# Patient Record
Sex: Male | Born: 1955 | Race: Black or African American | Hispanic: No | Marital: Married | Smoking: Former smoker
Health system: Southern US, Community
[De-identification: ages and names within clinical notes are randomized; demographics above are authoritative.]

## PROBLEM LIST (undated history)

## (undated) DIAGNOSIS — G4733 Obstructive sleep apnea (adult) (pediatric): Secondary | ICD-10-CM

## (undated) DIAGNOSIS — I509 Heart failure, unspecified: Secondary | ICD-10-CM

## (undated) DIAGNOSIS — G40909 Epilepsy, unspecified, not intractable, without status epilepticus: Secondary | ICD-10-CM

## (undated) DIAGNOSIS — I639 Cerebral infarction, unspecified: Secondary | ICD-10-CM

## (undated) DIAGNOSIS — E119 Type 2 diabetes mellitus without complications: Secondary | ICD-10-CM

## (undated) DIAGNOSIS — K7460 Unspecified cirrhosis of liver: Secondary | ICD-10-CM

## (undated) DIAGNOSIS — I10 Essential (primary) hypertension: Secondary | ICD-10-CM

## (undated) DIAGNOSIS — K219 Gastro-esophageal reflux disease without esophagitis: Secondary | ICD-10-CM

---

## 2009-05-01 ENCOUNTER — Ambulatory Visit: Payer: Self-pay | Admitting: Diagnostic Radiology

## 2009-05-01 ENCOUNTER — Emergency Department (HOSPITAL_BASED_OUTPATIENT_CLINIC_OR_DEPARTMENT_OTHER): Admission: EM | Admit: 2009-05-01 | Discharge: 2009-05-01 | Payer: Self-pay | Admitting: Emergency Medicine

## 2009-10-21 ENCOUNTER — Emergency Department (HOSPITAL_BASED_OUTPATIENT_CLINIC_OR_DEPARTMENT_OTHER)
Admission: EM | Admit: 2009-10-21 | Discharge: 2009-10-21 | Payer: Self-pay | Source: Home / Self Care | Admitting: Emergency Medicine

## 2009-10-22 ENCOUNTER — Emergency Department (HOSPITAL_BASED_OUTPATIENT_CLINIC_OR_DEPARTMENT_OTHER): Admission: EM | Admit: 2009-10-22 | Discharge: 2009-10-22 | Payer: Self-pay | Admitting: Emergency Medicine

## 2009-10-22 ENCOUNTER — Ambulatory Visit: Payer: Self-pay | Admitting: Diagnostic Radiology

## 2009-10-24 ENCOUNTER — Emergency Department (HOSPITAL_BASED_OUTPATIENT_CLINIC_OR_DEPARTMENT_OTHER)
Admission: EM | Admit: 2009-10-24 | Discharge: 2009-10-24 | Payer: Self-pay | Source: Home / Self Care | Admitting: Emergency Medicine

## 2010-01-05 ENCOUNTER — Emergency Department (HOSPITAL_BASED_OUTPATIENT_CLINIC_OR_DEPARTMENT_OTHER)
Admission: EM | Admit: 2010-01-05 | Discharge: 2010-01-06 | Payer: Self-pay | Source: Home / Self Care | Admitting: Emergency Medicine

## 2010-01-06 ENCOUNTER — Encounter (INDEPENDENT_AMBULATORY_CARE_PROVIDER_SITE_OTHER): Payer: Self-pay | Admitting: Emergency Medicine

## 2010-01-06 ENCOUNTER — Ambulatory Visit (HOSPITAL_COMMUNITY)
Admission: RE | Admit: 2010-01-06 | Discharge: 2010-01-06 | Payer: Self-pay | Source: Home / Self Care | Admitting: Emergency Medicine

## 2010-01-06 ENCOUNTER — Ambulatory Visit: Payer: Self-pay | Admitting: Vascular Surgery

## 2010-12-16 LAB — CBC
HCT: 34.9 % — ABNORMAL LOW (ref 39.0–52.0)
Hemoglobin: 12.2 g/dL — ABNORMAL LOW (ref 13.0–17.0)
Platelets: 78 10*3/uL — ABNORMAL LOW (ref 150–400)

## 2010-12-16 LAB — DIFFERENTIAL
Eosinophils Absolute: 0 10*3/uL (ref 0.0–0.7)
Lymphocytes Relative: 34 % (ref 12–46)
Lymphs Abs: 1.9 10*3/uL (ref 0.7–4.0)
Monocytes Relative: 10 % (ref 3–12)

## 2010-12-16 LAB — PROTIME-INR
INR: 1.1 (ref 0.00–1.49)
Prothrombin Time: 14.1 seconds (ref 11.6–15.2)

## 2010-12-16 LAB — COMPREHENSIVE METABOLIC PANEL
BUN: 22 mg/dL (ref 6–23)
Calcium: 9 mg/dL (ref 8.4–10.5)
GFR calc Af Amer: >60 mL/min (ref >60)
GFR calc non Af Amer: >60 mL/min (ref >60)
Glucose, Bld: 341 mg/dL — ABNORMAL HIGH (ref 70–99)
Sodium: 137 mEq/L (ref 135–145)
Total Bilirubin: 1.1 mg/dL (ref 0.3–1.2)

## 2010-12-19 LAB — BASIC METABOLIC PANEL
Calcium: 8.7 mg/dL (ref 8.4–10.5)
Creatinine, Ser: 0.6 mg/dL (ref 0.4–1.5)
GFR calc non Af Amer: >60 mL/min (ref >60)
Sodium: 136 mEq/L (ref 135–145)

## 2013-08-17 ENCOUNTER — Encounter (HOSPITAL_BASED_OUTPATIENT_CLINIC_OR_DEPARTMENT_OTHER): Payer: Self-pay | Admitting: Emergency Medicine

## 2013-08-17 ENCOUNTER — Emergency Department (HOSPITAL_BASED_OUTPATIENT_CLINIC_OR_DEPARTMENT_OTHER): Payer: Medicare Other

## 2013-08-17 ENCOUNTER — Inpatient Hospital Stay (HOSPITAL_BASED_OUTPATIENT_CLINIC_OR_DEPARTMENT_OTHER)
Admission: EM | Admit: 2013-08-17 | Discharge: 2013-08-25 | DRG: 623 | Disposition: A | Discharge status: Home / Self Care | Payer: Medicare Other | Attending: Orthopedic Surgery | Admitting: Orthopedic Surgery

## 2013-08-17 DIAGNOSIS — I10 Essential (primary) hypertension: Secondary | ICD-10-CM | POA: Diagnosis present

## 2013-08-17 DIAGNOSIS — T502X5A Adverse effect of carbonic-anhydrase inhibitors, benzothiadiazides and other diuretics, initial encounter: Secondary | ICD-10-CM | POA: Diagnosis not present

## 2013-08-17 DIAGNOSIS — D72819 Decreased white blood cell count, unspecified: Secondary | ICD-10-CM | POA: Diagnosis present

## 2013-08-17 DIAGNOSIS — L02413 Cutaneous abscess of right upper limb: Secondary | ICD-10-CM

## 2013-08-17 DIAGNOSIS — E1165 Type 2 diabetes mellitus with hyperglycemia: Principal | ICD-10-CM | POA: Diagnosis present

## 2013-08-17 DIAGNOSIS — D696 Thrombocytopenia, unspecified: Secondary | ICD-10-CM | POA: Diagnosis present

## 2013-08-17 DIAGNOSIS — F1010 Alcohol abuse, uncomplicated: Secondary | ICD-10-CM | POA: Diagnosis present

## 2013-08-17 DIAGNOSIS — IMO0002 Reserved for concepts with insufficient information to code with codable children: Principal | ICD-10-CM | POA: Diagnosis present

## 2013-08-17 DIAGNOSIS — E876 Hypokalemia: Secondary | ICD-10-CM | POA: Diagnosis not present

## 2013-08-17 DIAGNOSIS — K7460 Unspecified cirrhosis of liver: Secondary | ICD-10-CM | POA: Diagnosis present

## 2013-08-17 DIAGNOSIS — M60009 Infective myositis, unspecified site: Secondary | ICD-10-CM | POA: Diagnosis present

## 2013-08-17 DIAGNOSIS — A4901 Methicillin susceptible Staphylococcus aureus infection, unspecified site: Secondary | ICD-10-CM | POA: Diagnosis present

## 2013-08-17 DIAGNOSIS — F17200 Nicotine dependence, unspecified, uncomplicated: Secondary | ICD-10-CM | POA: Diagnosis present

## 2013-08-17 DIAGNOSIS — D62 Acute posthemorrhagic anemia: Secondary | ICD-10-CM | POA: Diagnosis not present

## 2013-08-17 DIAGNOSIS — Z794 Long term (current) use of insulin: Secondary | ICD-10-CM

## 2013-08-17 DIAGNOSIS — L02419 Cutaneous abscess of limb, unspecified: Secondary | ICD-10-CM

## 2013-08-17 DIAGNOSIS — E871 Hypo-osmolality and hyponatremia: Secondary | ICD-10-CM | POA: Diagnosis not present

## 2013-08-17 DIAGNOSIS — E119 Type 2 diabetes mellitus without complications: Secondary | ICD-10-CM | POA: Diagnosis present

## 2013-08-17 HISTORY — DX: Essential (primary) hypertension: I10

## 2013-08-17 HISTORY — DX: Type 2 diabetes mellitus without complications: E11.9

## 2013-08-17 LAB — BASIC METABOLIC PANEL
Chloride: 94 mEq/L — ABNORMAL LOW (ref 96–112)
Creatinine, Ser: 0.8 mg/dL (ref 0.50–1.35)
GFR calc Af Amer: >90 mL/min (ref >90)
Sodium: 134 mEq/L — ABNORMAL LOW (ref 135–145)

## 2013-08-17 MED ORDER — IOHEXOL 300 MG/ML  SOLN
100.0000 mL | Freq: Once | INTRAMUSCULAR | Status: AC | PRN
  Administered 2013-08-17: 100 mL via INTRAVENOUS

## 2013-08-17 MED ORDER — SODIUM CHLORIDE 0.9 % IV SOLN
Freq: Once | INTRAVENOUS | Status: AC
  Administered 2013-08-17: 20 mL/h via INTRAVENOUS

## 2013-08-17 MED ORDER — OXYCODONE-ACETAMINOPHEN 5-325 MG PO TABS
1.0000 | ORAL_TABLET | Freq: Once | ORAL | Status: AC
  Administered 2013-08-17: 1 via ORAL
  Filled 2013-08-17: qty 1

## 2013-08-17 MED ORDER — VANCOMYCIN HCL IN DEXTROSE 1-5 GM/200ML-% IV SOLN
1000.0000 mg | Freq: Once | INTRAVENOUS | Status: AC
  Administered 2013-08-17: 1000 mg via INTRAVENOUS
  Filled 2013-08-17: qty 200

## 2013-08-17 NOTE — ED Notes (Signed)
Right arm pain x 3 days-denies injury-swelling noted at elbow

## 2013-08-17 NOTE — ED Notes (Signed)
C/o rt fore arm swelling and painful x 5 days  Denies inj

## 2013-08-17 NOTE — ED Notes (Signed)
Return from ct.

## 2013-08-17 NOTE — ED Notes (Signed)
Patient transported to X-ray 

## 2013-08-17 NOTE — ED Provider Notes (Signed)
CSN: [id number]     Arrival date & time 08/17/13  2018 History   First MD Initiated Contact with Patient 08/17/13 2033     Chief Complaint  Patient presents with  . Arm Pain   (Consider location/radiation/quality/duration/timing/severity/associated sxs/prior Treatment) HPI Comments: A 57 year old male presents complaining of R forearm pain X 5 days. The pain has been progressive and he has developed swelling localized to the proximal forearm. He describes the pain as constant, throbbing and rates as 7/10. Movement makes it worse. He denies injury, heavy lifting. He states he had a subjective fever 3 days ago but none since. He want to [facility] hospital yesterday and was prescribed clindamycin for possible infection.  He denies n/v, HA, chills numbness or tingling in hands.  Patient is a 57 y.o. male presenting with arm pain. The history is provided by the patient. No language interpreter was used.  Arm Pain Associated symptoms include a fever and nausea. Pertinent negatives include no chest pain, chills, numbness or vomiting.    Past Medical History  Diagnosis Date  . Diabetes mellitus without complication   . Hypertension    History reviewed. No pertinent past surgical history. No family history on file. History  Substance Use Topics  . Smoking status: Current Every Day Smoker  . Smokeless tobacco: Not on file  . Alcohol Use: No    Review of Systems  Constitutional: Positive for fever. Negative for chills.       See HPI.  Respiratory: Negative for shortness of breath.   Cardiovascular: Negative for chest pain.  Gastrointestinal: Positive for nausea. Negative for vomiting.  Musculoskeletal:       See HPI.  Neurological: Negative for numbness.    Allergies  Shellfish allergy  Home Medications   Current Outpatient Rx  Name  Route  Sig  Dispense  Refill  . clindamycin (CLEOCIN) 150 MG capsule   Oral   Take 450 mg by mouth 3 (three) times daily.         . folic acid  (FOLVITE) 1 MG tablet   Oral   Take 1 mg by mouth daily.         Marland Kitchen glipiZIDE (GLUCOTROL) 10 MG tablet   Oral   Take 10 mg by mouth 2 (two) times daily before a meal.         . insulin glargine (LANTUS) 100 UNIT/ML injection   Subcutaneous   Inject 50 Units into the skin 2 (two) times daily.         Marland Kitchen lisinopril-hydrochlorothiazide (PRINZIDE,ZESTORETIC) 20-25 MG per tablet   Oral   Take 1 tablet by mouth daily.         . metFORMIN (GLUCOPHAGE-XR) 500 MG 24 hr tablet   Oral   Take 1,000 mg by mouth 2 (two) times daily.         . Multiple Vitamins-Minerals (MULTIVITAMIN WITH MINERALS) tablet   Oral   Take 1 tablet by mouth daily.         Marland Kitchen omeprazole (PRILOSEC) 20 MG capsule   Oral   Take 40 mg by mouth daily.         . propranolol (INDERAL) 40 MG tablet   Oral   Take 40 mg by mouth 2 (two) times daily.         Marland Kitchen thiamine (VITAMIN B-1) 100 MG tablet   Oral   Take 100 mg by mouth daily.          BP 205/107  Pulse 101  Temp(Src) 98.1 F (36.7 C) (Oral)  Resp 16  Ht 5\' 9"  (1.753 m)  Wt 189 lb (85.73 kg)  BMI 27.90 kg/m2  SpO2 100% Physical Exam  Constitutional: He appears well-developed and well-nourished. No distress.  Cardiovascular: Normal rate, regular rhythm, normal heart sounds and intact distal pulses.   Pulmonary/Chest: Effort normal and breath sounds normal.  Musculoskeletal:  Palpable mass right forearm proximal volar aspect. No redness or warmth. FROM. Elbow unremarkable.   Skin: No rash noted. He is not diaphoretic. No erythema. No pallor.    ED Course  Procedures (including critical care time) Labs Review Labs Reviewed - No data to display Imaging Review No results found.  EKG Interpretation   None       MDM  No diagnosis found.     Arnoldo Hooker, PA-C 08/17/13 [phone]

## 2013-08-18 ENCOUNTER — Encounter (HOSPITAL_COMMUNITY): Payer: Self-pay | Admitting: Anesthesiology

## 2013-08-18 ENCOUNTER — Encounter (HOSPITAL_COMMUNITY)
Admission: EM | Disposition: A | Discharge status: Home / Self Care | Payer: Self-pay | Source: Home / Self Care | Attending: Orthopedic Surgery

## 2013-08-18 ENCOUNTER — Emergency Department (HOSPITAL_COMMUNITY): Payer: Medicare Other | Admitting: Anesthesiology

## 2013-08-18 ENCOUNTER — Encounter (HOSPITAL_COMMUNITY): Payer: Medicare Other | Admitting: Anesthesiology

## 2013-08-18 DIAGNOSIS — E119 Type 2 diabetes mellitus without complications: Secondary | ICD-10-CM | POA: Diagnosis present

## 2013-08-18 DIAGNOSIS — L02413 Cutaneous abscess of right upper limb: Secondary | ICD-10-CM | POA: Diagnosis present

## 2013-08-18 DIAGNOSIS — E1165 Type 2 diabetes mellitus with hyperglycemia: Secondary | ICD-10-CM

## 2013-08-18 DIAGNOSIS — I10 Essential (primary) hypertension: Secondary | ICD-10-CM

## 2013-08-18 DIAGNOSIS — D696 Thrombocytopenia, unspecified: Secondary | ICD-10-CM

## 2013-08-18 DIAGNOSIS — IMO0002 Reserved for concepts with insufficient information to code with codable children: Secondary | ICD-10-CM

## 2013-08-18 HISTORY — PX: I & D EXTREMITY: SHX5045

## 2013-08-18 LAB — CBC WITH DIFFERENTIAL/PLATELET
Eosinophils Absolute: 0 10*3/uL (ref 0.0–0.7)
Eosinophils Relative: 1 % (ref 0–5)
HCT: 38.4 % — ABNORMAL LOW (ref 39.0–52.0)
Lymphocytes Relative: 51 % — ABNORMAL HIGH (ref 12–46)
Lymphs Abs: 1.6 10*3/uL (ref 0.7–4.0)
MCH: 33.3 pg (ref 26.0–34.0)
MCV: 92.5 fL (ref 78.0–100.0)
Monocytes Absolute: 0.3 10*3/uL (ref 0.1–1.0)
RBC: 4.15 MIL/uL — ABNORMAL LOW (ref 4.22–5.81)
RDW: 14.1 % (ref 11.5–15.5)
WBC: 3.1 10*3/uL — ABNORMAL LOW (ref 4.0–10.5)

## 2013-08-18 LAB — GLUCOSE, CAPILLARY
Glucose-Capillary: 147 mg/dL — ABNORMAL HIGH (ref 70–99)
Glucose-Capillary: 167 mg/dL — ABNORMAL HIGH (ref 70–99)
Glucose-Capillary: 52 mg/dL — ABNORMAL LOW (ref 70–99)

## 2013-08-18 LAB — URINALYSIS, ROUTINE W REFLEX MICROSCOPIC
Glucose, UA: NEGATIVE mg/dL
Leukocytes, UA: NEGATIVE
Nitrite: NEGATIVE
Specific Gravity, Urine: 1.02 (ref 1.005–1.030)
Urobilinogen, UA: 0.2 mg/dL (ref 0.0–1.0)

## 2013-08-18 LAB — URINE MICROSCOPIC-ADD ON

## 2013-08-18 LAB — C-REACTIVE PROTEIN: CRP: 1.4 mg/dL — ABNORMAL HIGH (ref <0.60)

## 2013-08-18 LAB — HEMOGLOBIN A1C
Hgb A1c MFr Bld: 7.1 % — ABNORMAL HIGH (ref <5.7)
Mean Plasma Glucose: 157 mg/dL — ABNORMAL HIGH (ref <117)

## 2013-08-18 LAB — SEDIMENTATION RATE: Sed Rate: 65 mm/hr — ABNORMAL HIGH (ref 0–16)

## 2013-08-18 SURGERY — IRRIGATION AND DEBRIDEMENT EXTREMITY
Anesthesia: General | Site: Arm Lower | Laterality: Right | Wound class: Dirty or Infected

## 2013-08-18 MED ORDER — LACTATED RINGERS IV SOLN
INTRAVENOUS | Status: DC | PRN
  Administered 2013-08-18: 02:00:00 via INTRAVENOUS

## 2013-08-18 MED ORDER — SUCCINYLCHOLINE CHLORIDE 20 MG/ML IJ SOLN
INTRAMUSCULAR | Status: DC | PRN
  Administered 2013-08-18: 100 mg via INTRAVENOUS

## 2013-08-18 MED ORDER — OXYCODONE HCL 5 MG PO TABS: 5.0000 mg | ORAL_TABLET | Freq: Once | ORAL | Status: DC | PRN

## 2013-08-18 MED ORDER — PROPOFOL 10 MG/ML IV BOLUS
INTRAVENOUS | Status: DC | PRN
  Administered 2013-08-18: 40 mg via INTRAVENOUS
  Administered 2013-08-18: 200 mg via INTRAVENOUS

## 2013-08-18 MED ORDER — OXYCODONE HCL 5 MG PO TABS
5.0000 mg | ORAL_TABLET | ORAL | Status: DC | PRN
  Administered 2013-08-18 – 2013-08-20 (×3): 10 mg via ORAL
  Administered 2013-08-20: 5 mg via ORAL
  Administered 2013-08-20 – 2013-08-24 (×11): 10 mg via ORAL
  Filled 2013-08-18 (×11): qty 2
  Filled 2013-08-18: qty 1
  Filled 2013-08-18 (×3): qty 2

## 2013-08-18 MED ORDER — DOCUSATE SODIUM 100 MG PO CAPS
100.0000 mg | ORAL_CAPSULE | Freq: Two times a day (BID) | ORAL | Status: DC
  Administered 2013-08-18 – 2013-08-25 (×13): 100 mg via ORAL
  Filled 2013-08-18 (×13): qty 1

## 2013-08-18 MED ORDER — ARTIFICIAL TEARS OP OINT
TOPICAL_OINTMENT | OPHTHALMIC | Status: DC | PRN
  Administered 2013-08-18: 1 via OPHTHALMIC

## 2013-08-18 MED ORDER — VITAMIN C 500 MG PO TABS
1000.0000 mg | ORAL_TABLET | Freq: Every day | ORAL | Status: DC
  Administered 2013-08-18 – 2013-08-25 (×7): 1000 mg via ORAL
  Filled 2013-08-18 (×9): qty 2

## 2013-08-18 MED ORDER — INSULIN GLARGINE 100 UNIT/ML ~~LOC~~ SOLN
45.0000 [IU] | Freq: Every day | SUBCUTANEOUS | Status: DC
  Filled 2013-08-18: qty 0.45

## 2013-08-18 MED ORDER — LIDOCAINE HCL (CARDIAC) 20 MG/ML IV SOLN
INTRAVENOUS | Status: DC | PRN
  Administered 2013-08-18: 100 mg via INTRAVENOUS

## 2013-08-18 MED ORDER — METHOCARBAMOL 500 MG PO TABS
500.0000 mg | ORAL_TABLET | Freq: Four times a day (QID) | ORAL | Status: DC | PRN
  Administered 2013-08-21 – 2013-08-24 (×5): 500 mg via ORAL
  Filled 2013-08-18 (×5): qty 1

## 2013-08-18 MED ORDER — SODIUM CHLORIDE 0.9 % IR SOLN
Status: DC | PRN
  Administered 2013-08-18: 1000 mL

## 2013-08-18 MED ORDER — VITAMIN B-1 100 MG PO TABS
100.0000 mg | ORAL_TABLET | Freq: Every day | ORAL | Status: DC
  Administered 2013-08-18 – 2013-08-25 (×7): 100 mg via ORAL
  Filled 2013-08-18 (×9): qty 1

## 2013-08-18 MED ORDER — FOLIC ACID 1 MG PO TABS
1.0000 mg | ORAL_TABLET | Freq: Every day | ORAL | Status: DC
  Administered 2013-08-18 – 2013-08-25 (×7): 1 mg via ORAL
  Filled 2013-08-18 (×9): qty 1

## 2013-08-18 MED ORDER — INSULIN ASPART 100 UNIT/ML ~~LOC~~ SOLN
0.0000 [IU] | Freq: Three times a day (TID) | SUBCUTANEOUS | Status: DC
  Administered 2013-08-18: 3 [IU] via SUBCUTANEOUS
  Administered 2013-08-18: 8 [IU] via SUBCUTANEOUS
  Administered 2013-08-19: 2 [IU] via SUBCUTANEOUS
  Administered 2013-08-19 – 2013-08-20 (×2): 3 [IU] via SUBCUTANEOUS
  Administered 2013-08-20: 8 [IU] via SUBCUTANEOUS
  Administered 2013-08-21 (×2): 3 [IU] via SUBCUTANEOUS
  Administered 2013-08-22: 2 [IU] via SUBCUTANEOUS
  Administered 2013-08-23: 5 [IU] via SUBCUTANEOUS
  Administered 2013-08-23: 3 [IU] via SUBCUTANEOUS
  Administered 2013-08-24: 5 [IU] via SUBCUTANEOUS

## 2013-08-18 MED ORDER — LISINOPRIL 20 MG PO TABS
20.0000 mg | ORAL_TABLET | Freq: Every day | ORAL | Status: DC
  Administered 2013-08-18: 20 mg via ORAL
  Filled 2013-08-18 (×3): qty 1

## 2013-08-18 MED ORDER — GLIPIZIDE 10 MG PO TABS
10.0000 mg | ORAL_TABLET | Freq: Two times a day (BID) | ORAL | Status: DC
  Administered 2013-08-18: 10 mg via ORAL
  Filled 2013-08-18: qty 1

## 2013-08-18 MED ORDER — HYDRALAZINE HCL 20 MG/ML IJ SOLN
10.0000 mg | Freq: Four times a day (QID) | INTRAMUSCULAR | Status: DC | PRN
  Administered 2013-08-18 (×2): 10 mg via INTRAVENOUS
  Filled 2013-08-18 (×2): qty 1

## 2013-08-18 MED ORDER — CEFAZOLIN SODIUM-DEXTROSE 2-3 GM-% IV SOLR
INTRAVENOUS | Status: DC | PRN
  Administered 2013-08-18: 2 g via INTRAVENOUS

## 2013-08-18 MED ORDER — HYDROMORPHONE HCL PF 1 MG/ML IJ SOLN
0.2500 mg | INTRAMUSCULAR | Status: DC | PRN
  Administered 2013-08-18 (×4): 0.5 mg via INTRAVENOUS

## 2013-08-18 MED ORDER — INSULIN ASPART 100 UNIT/ML ~~LOC~~ SOLN: 0.0000 [IU] | Freq: Three times a day (TID) | SUBCUTANEOUS | Status: DC

## 2013-08-18 MED ORDER — INSULIN GLARGINE 100 UNIT/ML ~~LOC~~ SOLN
50.0000 [IU] | Freq: Two times a day (BID) | SUBCUTANEOUS | Status: DC
  Administered 2013-08-18: 50 [IU] via SUBCUTANEOUS
  Filled 2013-08-18 (×3): qty 0.5

## 2013-08-18 MED ORDER — HYDROCHLOROTHIAZIDE 25 MG PO TABS
25.0000 mg | ORAL_TABLET | Freq: Every day | ORAL | Status: DC
  Administered 2013-08-18: 25 mg via ORAL
  Filled 2013-08-18 (×3): qty 1

## 2013-08-18 MED ORDER — LISINOPRIL-HYDROCHLOROTHIAZIDE 20-25 MG PO TABS: 1.0000 | ORAL_TABLET | Freq: Every day | ORAL | Status: DC

## 2013-08-18 MED ORDER — ALPRAZOLAM 0.5 MG PO TABS
0.5000 mg | ORAL_TABLET | Freq: Four times a day (QID) | ORAL | Status: DC | PRN
  Administered 2013-08-25: 0.5 mg via ORAL
  Filled 2013-08-18: qty 1

## 2013-08-18 MED ORDER — MORPHINE SULFATE 2 MG/ML IJ SOLN
1.0000 mg | INTRAMUSCULAR | Status: DC | PRN
  Administered 2013-08-18: 1 mg via INTRAVENOUS
  Filled 2013-08-18: qty 1

## 2013-08-18 MED ORDER — HYDROCERIN EX CREA
TOPICAL_CREAM | Freq: Two times a day (BID) | CUTANEOUS | Status: DC
  Administered 2013-08-18 – 2013-08-21 (×6): via TOPICAL
  Administered 2013-08-22: 1 via TOPICAL
  Administered 2013-08-22 – 2013-08-23 (×2): via TOPICAL
  Administered 2013-08-23 – 2013-08-24 (×2): 1 via TOPICAL
  Administered 2013-08-24: 11:00:00 via TOPICAL
  Filled 2013-08-18 (×3): qty 113

## 2013-08-18 MED ORDER — HYDROMORPHONE HCL PF 1 MG/ML IJ SOLN
INTRAMUSCULAR | Status: AC
  Filled 2013-08-18: qty 1

## 2013-08-18 MED ORDER — ONDANSETRON HCL 4 MG PO TABS: 4.0000 mg | ORAL_TABLET | Freq: Four times a day (QID) | ORAL | Status: DC | PRN

## 2013-08-18 MED ORDER — ADULT MULTIVITAMIN W/MINERALS CH
1.0000 | ORAL_TABLET | Freq: Every day | ORAL | Status: DC
  Administered 2013-08-18 – 2013-08-25 (×7): 1 via ORAL
  Filled 2013-08-18 (×9): qty 1

## 2013-08-18 MED ORDER — METHOCARBAMOL 100 MG/ML IJ SOLN: 500.0000 mg | Freq: Four times a day (QID) | INTRAVENOUS | Status: DC | PRN

## 2013-08-18 MED ORDER — PROPRANOLOL HCL 40 MG PO TABS
40.0000 mg | ORAL_TABLET | Freq: Two times a day (BID) | ORAL | Status: DC
  Administered 2013-08-18 – 2013-08-25 (×14): 40 mg via ORAL
  Filled 2013-08-18 (×19): qty 1

## 2013-08-18 MED ORDER — ONDANSETRON HCL 4 MG/2ML IJ SOLN
4.0000 mg | Freq: Four times a day (QID) | INTRAMUSCULAR | Status: DC | PRN
  Administered 2013-08-18: 4 mg via INTRAVENOUS
  Filled 2013-08-18: qty 2

## 2013-08-18 MED ORDER — VANCOMYCIN HCL IN DEXTROSE 1-5 GM/200ML-% IV SOLN
1000.0000 mg | Freq: Three times a day (TID) | INTRAVENOUS | Status: DC
  Administered 2013-08-18 – 2013-08-20 (×8): 1000 mg via INTRAVENOUS
  Filled 2013-08-18 (×11): qty 200

## 2013-08-18 MED ORDER — PROMETHAZINE HCL 25 MG RE SUPP: 12.5000 mg | Freq: Four times a day (QID) | RECTAL | Status: DC | PRN

## 2013-08-18 MED ORDER — ONDANSETRON HCL 4 MG/2ML IJ SOLN
INTRAMUSCULAR | Status: DC | PRN
  Administered 2013-08-18: 4 mg via INTRAVENOUS

## 2013-08-18 MED ORDER — PIPERACILLIN-TAZOBACTAM 3.375 G IVPB
3.3750 g | Freq: Three times a day (TID) | INTRAVENOUS | Status: DC
  Administered 2013-08-18 – 2013-08-20 (×8): 3.375 g via INTRAVENOUS
  Filled 2013-08-18 (×10): qty 50

## 2013-08-18 MED ORDER — PANTOPRAZOLE SODIUM 40 MG PO TBEC
40.0000 mg | DELAYED_RELEASE_TABLET | Freq: Every day | ORAL | Status: DC
  Administered 2013-08-18 – 2013-08-24 (×6): 40 mg via ORAL
  Filled 2013-08-18 (×5): qty 1

## 2013-08-18 MED ORDER — FENTANYL CITRATE 0.05 MG/ML IJ SOLN
INTRAMUSCULAR | Status: DC | PRN
  Administered 2013-08-18 (×8): 50 ug via INTRAVENOUS

## 2013-08-18 MED ORDER — INSULIN GLARGINE 100 UNIT/ML ~~LOC~~ SOLN
50.0000 [IU] | Freq: Every day | SUBCUTANEOUS | Status: DC
  Administered 2013-08-18: 50 [IU] via SUBCUTANEOUS
  Filled 2013-08-18: qty 0.5

## 2013-08-18 MED ORDER — METOCLOPRAMIDE HCL 5 MG/ML IJ SOLN: 10.0000 mg | Freq: Once | INTRAMUSCULAR | Status: DC | PRN

## 2013-08-18 MED ORDER — OXYCODONE HCL 5 MG/5ML PO SOLN: 5.0000 mg | Freq: Once | ORAL | Status: DC | PRN

## 2013-08-18 MED ORDER — METFORMIN HCL ER 500 MG PO TB24
1000.0000 mg | ORAL_TABLET | Freq: Two times a day (BID) | ORAL | Status: DC
  Administered 2013-08-18: 1000 mg via ORAL
  Filled 2013-08-18 (×2): qty 2

## 2013-08-18 MED ORDER — LACTATED RINGERS IV SOLN
INTRAVENOUS | Status: DC
  Administered 2013-08-18 (×2): via INTRAVENOUS

## 2013-08-18 SURGICAL SUPPLY — 45 items
BANDAGE CONFORM 2  STR LF (GAUZE/BANDAGES/DRESSINGS) IMPLANT
BANDAGE ELASTIC 4 VELCRO ST LF (GAUZE/BANDAGES/DRESSINGS) ×2 IMPLANT
BANDAGE GAUZE ELAST BULKY 4 IN (GAUZE/BANDAGES/DRESSINGS) ×2 IMPLANT
CLOTH BEACON ORANGE TIMEOUT ST (SAFETY) ×2 IMPLANT
CORDS BIPOLAR (ELECTRODE) ×2 IMPLANT
COVER SURGICAL LIGHT HANDLE (MISCELLANEOUS) ×1 IMPLANT
CUFF TOURNIQUET SINGLE 18IN (TOURNIQUET CUFF) ×2 IMPLANT
CUFF TOURNIQUET SINGLE 24IN (TOURNIQUET CUFF) IMPLANT
DRSG ADAPTIC 3X8 NADH LF (GAUZE/BANDAGES/DRESSINGS) ×2 IMPLANT
ELECT REM PT RETURN 9FT ADLT (ELECTROSURGICAL)
ELECTRODE REM PT RTRN 9FT ADLT (ELECTROSURGICAL) IMPLANT
GAUZE XEROFORM 1X8 LF (GAUZE/BANDAGES/DRESSINGS) ×2 IMPLANT
GAUZE XEROFORM 5X9 LF (GAUZE/BANDAGES/DRESSINGS) ×1 IMPLANT
GLOVE BIOGEL M STRL SZ7.5 (GLOVE) ×2 IMPLANT
GLOVE EXAM NITRILE LRG STRL (GLOVE) ×1 IMPLANT
GLOVE SS BIOGEL STRL SZ 8 (GLOVE) ×1 IMPLANT
GLOVE SUPERSENSE BIOGEL SZ 8 (GLOVE) ×2
GLOVE SURG SS PI 7.5 STRL IVOR (GLOVE) ×1 IMPLANT
GOWN PREVENTION PLUS XLARGE (GOWN DISPOSABLE) ×2 IMPLANT
GOWN STRL NON-REIN LRG LVL3 (GOWN DISPOSABLE) ×6 IMPLANT
GOWN STRL REIN XL XLG (GOWN DISPOSABLE) ×4 IMPLANT
HANDPIECE INTERPULSE COAX TIP (DISPOSABLE)
KIT BASIN OR (CUSTOM PROCEDURE TRAY) ×2 IMPLANT
KIT ROOM TURNOVER OR (KITS) ×2 IMPLANT
MANIFOLD NEPTUNE II (INSTRUMENTS) ×2 IMPLANT
NDL HYPO 25GX1X1/2 BEV (NEEDLE) IMPLANT
NEEDLE HYPO 25GX1X1/2 BEV (NEEDLE) IMPLANT
NS IRRIG 1000ML POUR BTL (IV SOLUTION) ×2 IMPLANT
PACK ORTHO EXTREMITY (CUSTOM PROCEDURE TRAY) ×2 IMPLANT
PAD ARMBOARD 7.5X6 YLW CONV (MISCELLANEOUS) ×4 IMPLANT
PAD CAST 4YDX4 CTTN HI CHSV (CAST SUPPLIES) ×1 IMPLANT
PADDING CAST COTTON 4X4 STRL (CAST SUPPLIES) ×2
SET HNDPC FAN SPRY TIP SCT (DISPOSABLE) IMPLANT
SET IRRIG Y TYPE TUR BLADDER L (SET/KITS/TRAYS/PACK) ×1 IMPLANT
SPONGE GAUZE 4X4 12PLY (GAUZE/BANDAGES/DRESSINGS) ×2 IMPLANT
SPONGE LAP 18X18 X RAY DECT (DISPOSABLE) ×2 IMPLANT
SPONGE LAP 4X18 X RAY DECT (DISPOSABLE) ×2 IMPLANT
SWAB COLLECTION DEVICE MRSA (MISCELLANEOUS) ×1 IMPLANT
SYR CONTROL 10ML LL (SYRINGE) IMPLANT
TOWEL OR 17X24 6PK STRL BLUE (TOWEL DISPOSABLE) ×2 IMPLANT
TOWEL OR 17X26 10 PK STRL BLUE (TOWEL DISPOSABLE) ×2 IMPLANT
TUBE ANAEROBIC SPECIMEN COL (MISCELLANEOUS) ×1 IMPLANT
TUBE CONNECTING 12X1/4 (SUCTIONS) ×2 IMPLANT
WATER STERILE IRR 1000ML POUR (IV SOLUTION) ×2 IMPLANT
YANKAUER SUCT BULB TIP NO VENT (SUCTIONS) ×2 IMPLANT

## 2013-08-18 NOTE — Op Note (Signed)
See dictation #[id number] Wendall Stade MD

## 2013-08-18 NOTE — ED Notes (Signed)
Dr Gramig at bedside. 

## 2013-08-18 NOTE — Op Note (Signed)
NAME:  Ernest [name], Ernest [name] NO.:  [id number]  MEDICAL RECORD NO.:  [id number]  LOCATION:  6N06C                        FACILITY:  [facility]  PHYSICIAN:  Dionne Ano. Kamrynn Melott, M.D.DATE OF BIRTH:  1955-10-06  DATE OF PROCEDURE:  08/17/2013 DATE OF DISCHARGE:                              OPERATIVE REPORT   PREOPERATIVE DIAGNOSIS:  Enlarging painful mass, right forearm.  POSTOPERATIVE DIAGNOSIS:  Abscess, right forearm with muscle necrosis.  PROCEDURES PERFORMED: 1. I and D, deep abscess, right forearm. 2. Radial nerve exploration neurolysis, right forearm. 3. Excision, necrotic brachioradialis muscle and mobile wad muscle,     right forearm.  SURGEON:  Dionne Ano. Amanda Pea, MD.  ASSISTANT:  None.  COMPLICATIONS:  None.  ANESTHESIA:  General.  TOURNIQUET TIME:  Less than 30 minutes.  INDICATIONS:  The patient is a pleasant male, who has had an enlarging painful mass in his forearm.  He does not have a standing erythema.  He was sent over from [facility] with a small area of fluid in the brachioradialis region, I have reviewed this.  Given all issues, it is difficult to ascertain the etiology as he has had no trauma, inoculation or other issue.  Given his diabetic status, pain, and progressive worsening to a certain extent, I would recommend exploration and I and D.  OPERATIVE PROCEDURE:  The patient was seen by myself and Anesthesia, taken to the operative room and underwent a smooth induction of general anesthesia.  Following this, he was laid supine, padded fully, prepped and draped in usual sterile fashion with Hibiclens scrub and paint. Following this, the patient underwent incision over the mobile wad where the area of muscle was tense.  Dissection was carried down.  Subcu was benign.  Skin was benign.  We entered the fascia and then following this, entered deeply into the brachioradialis region and noted necrotic muscle as well as a deep abscess.   I cultured the fluid, which was clearly infected fluid for aerobic and anaerobic cultures.  The patient tolerated this well.  Following this, we performed an extensive radial nerve neurolysis and decompression in the proximal forearm followed by excision of the necrotic muscle.  This was excision of brachioradialis muscle, which was necrotic.  I took care to make sure that all tissue planes were explored and that the necrotic tissue was removed.  Given all issues, I did have some significant concerns as to the etiology, but nevertheless, it is definitive as this appeared to be an infectious process with muscle necrosis.  He states he has had a similar issue occurred in his leg before and he required drainage as well.  At the present time, I would admit him for IV antibiotics, close observation, etc.  The wound was irrigated with 3 L of saline through cysto tubing, Penrose drain, and Loose closure was accomplished and bulky bandage placed.  He will be taken to the recovery room.  We will monitor him closely.  I will place him on vanc and Zosyn until cultures are final.     Dionne Ano. Amanda Pea, M.D.     [facility]  D:  08/18/2013  T:  08/18/2013  Job:  [id number] 

## 2013-08-18 NOTE — Anesthesia Procedure Notes (Signed)
Procedure Name: Intubation Date/Time: 08/18/2013 2:18 AM Performed by: Luster Landsberg Pre-anesthesia Checklist: Patient identified, Emergency Drugs available, Suction available and Patient being monitored Patient Re-evaluated:Patient Re-evaluated prior to inductionOxygen Delivery Method: Circle system utilized Preoxygenation: Pre-oxygenation with 100% oxygen Intubation Type: IV induction, Rapid sequence and Cricoid Pressure applied Laryngoscope Size: Mac and 3 Grade View: Grade I Tube type: Oral Tube size: 7.5 mm Number of attempts: 1 Airway Equipment and Method: Stylet Placement Confirmation: ETT inserted through vocal cords under direct vision,  positive ETCO2 and breath sounds checked- equal and bilateral Secured at: 23 cm Tube secured with: Tape Dental Injury: Teeth and Oropharynx as per pre-operative assessment

## 2013-08-18 NOTE — Progress Notes (Signed)
UR completed.  Patient changed to inpatient status r/t requiring IV antibiotics and IVF

## 2013-08-18 NOTE — H&P (Signed)
Edrian Melucci is an 57 y.o. male.   Chief Complaint: Pain and swelling right forearm HPI: Patient presents with pain and swelling in his right forearm. He states that last Thursday he began having difficulties. This prompted a visit to [facility] hospital [facility] He was seen at [facility] and referred to [facility] for further care. He has a CT scan showing a fluid collection in his proximal forearm in the region of his brachioradialis muscle. He denies high fever. His wife Lennox Grumbles states that he has had some redness in your thing about the arm but is not overly impressive at the moment. He does complain of pain in the forearm.  He notes no history of trauma an puncture or other issue. He denies history of tumor. He is diabetic.  He denies neck back chest or abdominal pain. He denies any instability type symptoms.  Past Medical History  Diagnosis Date  . Diabetes mellitus without complication   . Hypertension     History reviewed. No pertinent past surgical history.  No family history on file. Social History:  reports that he has been smoking.  He does not have any smokeless tobacco history on file. He reports that he does not drink alcohol. His drug history is not on file.  Allergies:  Allergies  Allergen Reactions  . Shellfish Allergy Swelling     (Not in a hospital admission)  Results for orders placed during the hospital encounter of 08/17/13 (from the past 48 hour(s))  BASIC METABOLIC PANEL     Status: Abnormal   Collection Time    08/17/13  9:59 PM      Result Value Range   Sodium 134 (*) 135 - 145 mEq/L   Potassium 4.0  3.5 - 5.1 mEq/L   Chloride 94 (*) 96 - 112 mEq/L   CO2 27  19 - 32 mEq/L   Glucose, Bld 195 (*) 70 - 99 mg/dL   BUN 8  6 - 23 mg/dL   Creatinine, Ser 1.61  0.50 - 1.35 mg/dL   Calcium 8.5  8.4 - 09.6 mg/dL   GFR calc non Af Amer >90  >90 mL/min   GFR calc Af Amer >90  >90 mL/min   Comment:  (NOTE)     The eGFR has been calculated using the CKD EPI equation.     This calculation has not been validated in all clinical situations.     eGFR's persistently <90 mL/min signify possible Chronic Kidney     Disease.  CBC WITH DIFFERENTIAL     Status: Abnormal (Preliminary result)   Collection Time    08/18/13  1:25 AM      Result Value Range   WBC 3.1 (*) 4.0 - 10.5 K/uL   RBC 4.15 (*) 4.22 - 5.81 MIL/uL   Hemoglobin 13.8  13.0 - 17.0 g/dL   HCT 04.5 (*) 40.9 - 81.1 %   MCV 92.5  78.0 - 100.0 fL   MCH 33.3  26.0 - 34.0 pg   MCHC 35.9  30.0 - 36.0 g/dL   RDW 91.4  78.2 - 95.6 %   Platelets PENDING  150 - 400 K/uL   Neutrophils Relative % 40 (*) 43 - 77 %   Neutro Abs 1.3 (*) 1.7 - 7.7 K/uL   Lymphocytes Relative 51 (*) 12 - 46 %   Lymphs Abs 1.6  0.7 - 4.0 K/uL  Monocytes Relative 8  3 - 12 %   Monocytes Absolute 0.3  0.1 - 1.0 K/uL   Eosinophils Relative 1  0 - 5 %   Eosinophils Absolute 0.0  0.0 - 0.7 K/uL   Basophils Relative 0  0 - 1 %   Basophils Absolute 0.0  0.0 - 0.1 K/uL   Ct Forearm Right W Contrast  08/17/2013   CLINICAL DATA:  Forearm swelling for 3 days, no injury.  EXAM: CT OF THE RIGHT FOREARM WITH CONTRAST  TECHNIQUE: Multidetector CT imaging was performed following the standard protocol during bolus administration of intravenous contrast.  CONTRAST:  OMNIPAQUE IOHEXOL 300 MG/ML  SOLN  COMPARISON:  None available for comparison at time of study interpretation.  FINDINGS: 13 x 9 x 20 mm (transverse by AP by CC) rim enhancing fluid collection within the brachioradialis muscle, approximately 4 cm distal to the antecubital fossa consistent with abscess. The muscle appears mildly hypodense most consistent with edema. Mild reticulation of the associated overlying subcutaneous fat suggests cellulitis. No additional fluid collection seen. Tiny filling defect within an anterior forearm vein, axial 8/133 may reflect a tiny thrombus.  No fracture dislocation.  No  destructive bony lesions.  IMPRESSION: 13 x 9 x 20 mm fluid collection within the brachioradialis muscle concerning for abscess with muscle edema. Tiny filling defect within an anterior forearm vein concerning for thrombus. Minimal inflammatory changes of the subcutaneous fat could reflect cellulitis.   Electronically Signed   By: Awilda Metro   On: 08/17/2013 23:14    Review of Systems  Constitutional: Negative.   Eyes: Negative.   Cardiovascular: Negative.   Gastrointestinal: Negative.   Genitourinary: Negative.   Skin: Negative.   Neurological: Negative.   Endo/Heme/Allergies: Negative.   Psychiatric/Behavioral: Negative.     Blood pressure 144/74, pulse 95, temperature 97.6 F (36.4 C), temperature source Oral, resp. rate 17, height 5\' 9"  (1.753 m), weight 85.73 kg (189 lb), SpO2 99.00%. Physical Exam this patient has a area of pain in the proximal forearm his forearm is tender to palpation. He has a hard and yielding region about the mobile wad in his forearm. I this is the area of maximal tenderness. He is neurovascularly intact he has no signs of compartment syndrome or dystrophy. I discussed these issues with him at length.  He has normal pulse he is sensate in his shoulder and wrist are nontender. His CT scan is reviewed which does show evidence of a fluid collection in the proximal forearm  .Marland KitchenThe patient is alert and oriented in no acute distress the patient complains of pain in the affected upper extremity.  The patient is noted to have a normal HEENT exam.  Lung fields show equal chest expansion and no shortness of breath  abdomen exam is nontender without distention.  Lower extremity examination does not show any fracture dislocation or blood clot symptoms.  Pelvis is stable neck and back are stable and nontender  Assessment/Plan Patient has a continuing pain in his proximal forearm with swelling and a fluid fluctuance on CT exam tonight. At this juncture given the  failure of conservative measures and continued pain would recommend considering irrigation debridement and thorough exploration. His had a similar problem in his leg which prompted the same course of action however this was performed at [facility]. The patient understands risk and benefits and desires to proceed with an irrigation and debridement-type procedure tonight. I feel that given his exam pain and worsening problems  this is certainly the best route for this patient who is a diabetic and more prone to infections in the general population.  Marland Kitchen.We are planning surgery for your upper extremity. The risk and benefits of surgery include risk of bleeding infection anesthesia damage to normal structures and failure of the surgery to accomplish its intended goals of relieving symptoms and restoring function with this in mind we'll going to proceed. I have specifically discussed with the patient the pre-and postoperative regime and the does and don'ts and risk and benefits in great detail. Risk and benefits of surgery also include risk of dystrophy chronic nerve pain failure of the healing process to go onto completion and other inherent risks of surgery The relavent the pathophysiology of the disease/injury process, as well as the alternatives for treatment and postoperative course of action has been discussed in great detail with the patient who desires to proceed.  We will do everything in our power to help you (the patient) restore function to the upper extremity. Is a pleasure to see this patient today.   Braeleigh Pyper III,Xzaria Teo M 08/18/2013, 1:47 AM

## 2013-08-18 NOTE — Anesthesia Preprocedure Evaluation (Addendum)
Anesthesia Evaluation  Patient identified by MRN, date of birth, ID band Patient awake    Reviewed: Allergy & Precautions, H&P , NPO status , Patient's Chart, lab work & pertinent test results, reviewed documented beta blocker date and time   Airway Mallampati: II TM Distance: >3 FB Neck ROM: full    Dental   Pulmonary Current Smoker,  breath sounds clear to auscultation        Cardiovascular hypertension, On Medications and Pt. on home beta blockers Rhythm:regular     Neuro/Psych negative neurological ROS  negative psych ROS   GI/Hepatic negative GI ROS, Neg liver ROS,   Endo/Other  diabetes, Oral Hypoglycemic Agents  Renal/GU negative Renal ROS  negative genitourinary   Musculoskeletal   Abdominal   Peds  Hematology negative hematology ROS (+)   Anesthesia Other Findings See surgeon's H&P   Reproductive/Obstetrics negative OB ROS                          Anesthesia Physical Anesthesia Plan  ASA: III and emergent  Anesthesia Plan: General   Post-op Pain Management:    Induction: Intravenous, Rapid sequence and Cricoid pressure planned  Airway Management Planned: Oral ETT  Additional Equipment:   Intra-op Plan:   Post-operative Plan: Extubation in OR  Informed Consent: I have reviewed the patients History and Physical, chart, labs and discussed the procedure including the risks, benefits and alternatives for the proposed anesthesia with the patient or authorized representative who has indicated his/her understanding and acceptance.   Dental Advisory Given  Plan Discussed with: CRNA and Surgeon  Anesthesia Plan Comments:         Anesthesia Quick Evaluation

## 2013-08-18 NOTE — Transfer of Care (Signed)
Immediate Anesthesia Transfer of Care Note  Patient: Ernest [name]  Procedure(s) Performed: Procedure(s): IRRIGATION AND DEBRIDEMENT EXTREMITY (Right)  Patient Location: PACU  Anesthesia Type:General  Level of Consciousness: awake, alert  and oriented  Airway & Oxygen Therapy: Patient Spontanous Breathing and Patient connected to nasal cannula oxygen  Post-op Assessment: Report given to PACU RN, Post -op Vital signs reviewed and stable and Patient moving all extremities  Post vital signs: Reviewed and stable  Complications: No apparent anesthesia complications

## 2013-08-18 NOTE — ED Notes (Addendum)
Per verbal order from Dr. Fredderick Phenix  Stop vanc and make iv nsl and leave in

## 2013-08-18 NOTE — Progress Notes (Signed)
Patient ID: Ernest [name], male   DOB: [birth year], 57 y.o.   MRN: [id number] Status post I&D right forearm abscess with muscle necrosis. Patient was I&D in the early hours of this date.  Vital signs stable.  He is tolerating a diet.  He notes no locking popping or catching. He has good finger flexion and extension and he is sensate. I feel he likely get some wrist extension weakness due to the muscle loss but nevertheless the fingers are firing well.  I discussed with patient we are now point to await his cultures old skin continue elevation motion and massage to the fingers and see how he does of the course of the next few days. Hopefully we can maintain tight diabetic control and see him back closely.  He understands once final cultures are obtained we'll transition to a by mouth antibiotic and move forward. I have discussed these issues with him at length. I will plan to remove his Penrose drain tomorrow and perform a formal dressing change and I&D at bedside.  Lower extremity examination is benign chest and lungs are benign abdomen is nontender obese. HEENT stay normal limits.  We'll check him in March mentioned he understands all issues plans and cautions  W Zhana Jeangilles M.D.

## 2013-08-18 NOTE — Progress Notes (Signed)
Inpatient Diabetes Program Recommendations  AACE/ADA: New Consensus Statement on Inpatient Glycemic Control (2013)  Target Ranges:  Prepandial:   less than 140 mg/dL      Peak postprandial:   less than 180 mg/dL (1-2 hours)      Critically ill patients:  140 - 180 mg/dL    Results for Ernest [name], Ernest [name] (MRN [id number]) as of 08/18/2013 13:10  Ref. Range 08/18/2013 07:51 08/18/2013 12:09  Glucose-Capillary Latest Range: 70-99 mg/dL 846 (H) 962 (H)    Inpatient Diabetes Program Recommendations Correction (SSI): MD- Please add Novolog Moderate correction scale (SSI) tid ac + HS.   Will follow. Ambrose Finland RN, MSN, CDE Diabetes Coordinator Inpatient Diabetes Program Team Pager: [phone] (8a-10p)

## 2013-08-18 NOTE — ED Notes (Signed)
Surgeon at bedside.  

## 2013-08-18 NOTE — ED Provider Notes (Signed)
Pt with arm pain for last 4 days, +mild swelling/TTP over proximal forearm, radial side.  No bony tenderness.  Feels firm on palpation, but no fluctuance/induration.  Mild erythema to area.  +deeper abscess to brachioradialis muscle on CT 1.3x2cm.  Given IV vanc, TDAP utd.  Discussed with Dr. Amanda Pea who will see pt in the ED at [facility].  Pt's wife to transport.  Rolan Bucco, MD 08/18/13 [phone]

## 2013-08-18 NOTE — ED Notes (Signed)
Iv wrapped w kling,  Pt advised to go directly to [facility],  Do not eat or drink while in route  Pt a&o  ambulatory

## 2013-08-18 NOTE — ED Provider Notes (Signed)
Patient transferred from [facility] by Dr. Fredderick Phenix and Ms. Upstill. Pt has a right forearm fluid collection, possible abscess. Warm to touch, +edema. S/p vanc. Dr. Amanda Pea here to further evaluate the patient.  Filed Vitals:   08/18/13 0046  BP: 179/98  Pulse: 97  Temp: 97.6 F (36.4 C)  Resp: 20     Derwood Kaplan, MD 08/18/13 0109

## 2013-08-18 NOTE — ED Provider Notes (Signed)
Medical screening examination/treatment/procedure(s) were conducted as a shared visit with non-physician practitioner(s) and myself.  I personally evaluated the patient during the encounter.  EKG Interpretation    Date/Time:  Wednesday August 18 2013 01:22:07 EST Ventricular Rate:  95 PR Interval:  156 QRS Duration: 85 QT Interval:  376 QTC Calculation: 473 R Axis:   21 Text Interpretation:  Sinus rhythm Probable left atrial enlargement Left ventricular hypertrophy Nonspecific T abnormalities, inferior leads Baseline wander in lead(s) I Confirmed by Rhunette Croft, MD, ANKIT ([id number]) on 08/18/2013 1:29:14 AM              Rolan Bucco, MD 08/18/13 1010

## 2013-08-18 NOTE — Progress Notes (Signed)
ANTIBIOTIC CONSULT NOTE - INITIAL  Pharmacy Consult for vancomycin and zosyn  Indication: post op I+D of forearm  Allergies  Allergen Reactions  . Shellfish Allergy Swelling    Patient Measurements: Height: 5\' 9"  (175.3 cm) Weight: 196 lb 6.4 oz (89.086 kg) IBW/kg (Calculated) : 70.7 Adjusted Body Weight:   Vital Signs: Temp: 97.8 F (36.6 C) (11/19 0441) Temp src: Oral (11/19 0441) BP: 163/96 mmHg (11/19 0523) Pulse Rate: 85 (11/19 0523) Intake/Output from previous day: 11/18 0701 - 11/19 0700 In: 600 [I.V.:600] Out: -  Intake/Output from this shift: Total I/O In: 600 [I.V.:600] Out: -   Labs:  Recent Labs  08/17/13 2159 08/18/13 0125  WBC  --  3.1*  HGB  --  13.8  PLT  --  57*  CREATININE 0.80  --    Estimated Creatinine Clearance: 113.9 ml/min (by C-G formula based on Cr of 0.8). No results found for this basename: VANCOTROUGH, VANCOPEAK, VANCORANDOM, GENTTROUGH, GENTPEAK, GENTRANDOM, TOBRATROUGH, TOBRAPEAK, TOBRARND, AMIKACINPEAK, AMIKACINTROU, AMIKACIN,  in the last 72 hours   Microbiology: No results found for this or any previous visit (from the past 720 hour(s)).  Medical History: Past Medical History  Diagnosis Date  . Diabetes mellitus without complication   . Hypertension     Medications:  Prescriptions prior to admission  Medication Sig Dispense Refill  . clindamycin (CLEOCIN) 150 MG capsule Take 450 mg by mouth 3 (three) times daily.      . folic acid (FOLVITE) 1 MG tablet Take 1 mg by mouth daily.      Marland Kitchen glipiZIDE (GLUCOTROL) 10 MG tablet Take 10 mg by mouth 2 (two) times daily before a meal.      . insulin glargine (LANTUS) 100 UNIT/ML injection Inject 50 Units into the skin 2 (two) times daily.      Marland Kitchen lisinopril-hydrochlorothiazide (PRINZIDE,ZESTORETIC) 20-25 MG per tablet Take 1 tablet by mouth daily.      . metFORMIN (GLUCOPHAGE-XR) 500 MG 24 hr tablet Take 1,000 mg by mouth 2 (two) times daily.      . Multiple Vitamins-Minerals  (MULTIVITAMIN WITH MINERALS) tablet Take 1 tablet by mouth daily.      Marland Kitchen omeprazole (PRILOSEC) 20 MG capsule Take 40 mg by mouth daily.      . propranolol (INDERAL) 40 MG tablet Take 40 mg by mouth 2 (two) times daily.      Marland Kitchen thiamine (VITAMIN B-1) 100 MG tablet Take 100 mg by mouth daily.       Assessment: S/p I+D of infected proximal forearm.   Goal of Therapy:  Vancomycin trough level 10-15 mcg/ml  Plan:  Vancomycin 1gm q8h -trough at 4th dose.  Zosyn 3.375gm q8h   Janice Coffin 08/18/2013,6:38 AM

## 2013-08-18 NOTE — Consult Note (Signed)
[facility] Medical Consultation  PATIENT DETAILS Name: Ernest [name] Age: 57 y.o. Sex: male Date of Birth: 1956/04/29 Admit Date: 08/17/2013 PCP:No PCP Per Patient Requesting MD: Dominica Severin, MD PCP is Marcy [place], [place]  Date of consultation: 08/18/2013  REASON FOR CONSULTATION:  General medical management. (BP, DM, Low blood counts)  Impression/Recommendations Principal Problem:   Abscess of right arm Active Problems:   HTN (hypertension)   Type II or unspecified type diabetes mellitus with unspecified complication, uncontrolled   Thrombocytopenia, unspecified   DM uncontrolled Likely elevated due to infection Check Hgb A1c Hold Oral meds in hospital  Continue lantus bid Add SSI - Moderate  BP HCTZ, Lisinopril, Inderal Added hydralazine PRN  Thrombocytopenia Uncertain etiology (patient with some alcohol use from his description)-had thrombocytopenia in 2011 as well Checking smear, LFT, PT in am LFTs elevated in 2011 Patient reports he may have been told in the past that he has low platelets (uncertain) Avoid heparin  Leukopenia -?secondary to infection-follow CBC -check HIV  Right forearm abscess with muscle necrosis Per Primary Team Under went I and D 11/19 am. (Patient does not remember procedure)  DVT Prophylaxis:  SCD  Code Status: Full  [facility] will followup again tomorrow. Please contact me if I can be of assistance in the meanwhile. Thank you for this consultation.  Conley Canal [facility] Pager [phone]  Subjective:  Patient is a poor historian.  Depends on his wife to give history (she is not present).  He reports he was diagnosed with DM approximately 4 years ago. He reports that he may have been told in the past his platelets were low.  No complaints.   ALLERGIES:   Allergies  Allergen Reactions  . Shellfish Allergy Anaphylaxis and Swelling    PAST MEDICAL HISTORY: Past Medical  History  Diagnosis Date  . Diabetes mellitus without complication   . Hypertension     PAST SURGICAL HISTORY: History reviewed. No pertinent past surgical history.  MEDICATIONS AT HOME: Prior to Admission medications   Medication Sig Start Date End Date Taking? Authorizing Provider  clindamycin (CLEOCIN) 150 MG capsule Take 450 mg by mouth 3 (three) times daily.   Yes Historical Provider, MD  folic acid (FOLVITE) 1 MG tablet Take 1 mg by mouth daily.   Yes Historical Provider, MD  glipiZIDE (GLUCOTROL) 10 MG tablet Take 10 mg by mouth 2 (two) times daily before a meal.   Yes Historical Provider, MD  insulin glargine (LANTUS) 100 UNIT/ML injection Inject 50 Units into the skin 2 (two) times daily.   Yes Historical Provider, MD  lisinopril-hydrochlorothiazide (PRINZIDE,ZESTORETIC) 20-25 MG per tablet Take 1 tablet by mouth daily.   Yes Historical Provider, MD  metFORMIN (GLUCOPHAGE-XR) 500 MG 24 hr tablet Take 1,000 mg by mouth 2 (two) times daily.   Yes Historical Provider, MD  Multiple Vitamins-Minerals (MULTIVITAMIN WITH MINERALS) tablet Take 1 tablet by mouth daily.   Yes Historical Provider, MD  omeprazole (PRILOSEC) 20 MG capsule Take 40 mg by mouth daily.   Yes Historical Provider, MD  propranolol (INDERAL) 40 MG tablet Take 40 mg by mouth 2 (two) times daily.   Yes Historical Provider, MD  thiamine (VITAMIN B-1) 100 MG tablet Take 100 mg by mouth daily.   Yes Historical Provider, MD    FAMILY HISTORY: No family history on file. Parents are alive and living in [place].  Father has HTN.  No other known medical hx.  SOCIAL HISTORY:  reports that he has  been smoking.  He does not have any smokeless tobacco history on file. He reports that he does not drink alcohol. His drug history is not on file. Patient reports occasional tobacco use.  He drinks beer 3x a week and drinks occasional hard alcohol.  REVIEW OF SYSTEMS:  Constitutional:   No  weight loss, night sweats,  Fevers, chills,  fatigue.  HEENT:    No headaches, Difficulty swallowing,Tooth/dental problems,Sore throat,  No sneezing, itching, ear ache, nasal congestion, post nasal drip,   Cardio-vascular: No chest pain,  Orthopnea, PND, swelling in lower extremities, anasarca, dizziness, palpitations  GI:  No heartburn, indigestion, abdominal pain, nausea, vomiting, diarrhea, change in bowel habits, loss of appetite  Resp: No shortness of breath with exertion or at rest.  No excess mucus, no productive cough, No non-productive cough,  No coughing up of blood.No change in color of mucus.No wheezing.No chest wall deformity  Skin:  no rash or lesions.  GU:  no dysuria, change in color of urine, no urgency or frequency.  No flank pain.  Psych: No change in mood or affect. No depression or anxiety.  No memory loss.   PHYSICAL EXAM: Blood pressure 169/104, pulse 89, temperature 98.8 F (37.1 C), temperature source Oral, resp. rate 18, height 5\' 9"  (1.753 m), weight 89.086 kg (196 lb 6.4 oz), SpO2 98.00%.  General appearance :Awake, alert, not in any distress. Speech Clear. Not toxic Looking HEENT: Atraumatic and Normocephalic, pupils equally reactive to light and accomodation Neck: supple, no JVD. No cervical lymphadenopathy.  Chest:Good air entry bilaterally, no added sounds  CVS: S1 S2 regular, no murmurs.  Abdomen: Bowel sounds present, Non tender and not distended with no gaurding, rigidity or rebound. Extremities: B/L Lower Ext shows no edema, both legs are warm to touch, with  dorsalis pedis pulses palpable. Right upper arm in sling, wrapped in ace. Neurology: Awake alert, and oriented X 3, CN II-XII intact, Non focal, Deep Tendon Reflex-2+ all over, plantar's downgoing B/L, sensory exam is grossly intact.  Skin:No Rash, dry skin. Wounds:N/A  LABS ON ADMISSION:   Recent Labs  08/17/13 2159  NA 134*  K 4.0  CL 94*  CO2 27  GLUCOSE 195*  BUN 8  CREATININE 0.80  CALCIUM 8.5    Recent Labs   08/18/13 0125  WBC 3.1*  NEUTROABS 1.3*  HGB 13.8  HCT 38.4*  MCV 92.5  PLT 57*     RADIOLOGIC STUDIES ON ADMISSION: Ct Forearm Right W Contrast  08/17/2013   CLINICAL DATA:  Forearm swelling for 3 days, no injury.  EXAM: CT OF THE RIGHT FOREARM WITH CONTRAST  TECHNIQUE: Multidetector CT imaging was performed following the standard protocol during bolus administration of intravenous contrast.  CONTRAST:  OMNIPAQUE IOHEXOL 300 MG/ML  SOLN  COMPARISON:  None available for comparison at time of study interpretation.  FINDINGS: 13 x 9 x 20 mm (transverse by AP by CC) rim enhancing fluid collection within the brachioradialis muscle, approximately 4 cm distal to the antecubital fossa consistent with abscess. The muscle appears mildly hypodense most consistent with edema. Mild reticulation of the associated overlying subcutaneous fat suggests cellulitis. No additional fluid collection seen. Tiny filling defect within an anterior forearm vein, axial 8/133 may reflect a tiny thrombus.  No fracture dislocation.  No destructive bony lesions.  IMPRESSION: 13 x 9 x 20 mm fluid collection within the brachioradialis muscle concerning for abscess with muscle edema. Tiny filling defect within an anterior forearm vein concerning for thrombus.  Minimal inflammatory changes of the subcutaneous fat could reflect cellulitis.   Electronically Signed   By: Awilda Metro   On: 08/17/2013 23:14     Total time spent 45 minutes.  Conley Canal [facility] Pager [phone]  If 7PM-7AM, please contact night-coverage [email or web address] Password [facility] 08/18/2013, 2:18 PM  Attending Patient seen and examined, agree with the above assessment and plan. Will follow while in hospital. Suspect ETOH use-need to watch for withdrawal.May need to abdominal ultrasound to check if liver morphology consistent with Cirrhosis.

## 2013-08-19 ENCOUNTER — Inpatient Hospital Stay (HOSPITAL_COMMUNITY): Payer: Medicare Other

## 2013-08-19 LAB — HEPATIC FUNCTION PANEL
Bilirubin, Direct: 0.8 mg/dL — ABNORMAL HIGH (ref 0.0–0.3)
Total Bilirubin: 2 mg/dL — ABNORMAL HIGH (ref 0.3–1.2)
Total Protein: 7.2 g/dL (ref 6.0–8.3)

## 2013-08-19 LAB — BASIC METABOLIC PANEL
BUN: 7 mg/dL (ref 6–23)
Calcium: 8.1 mg/dL — ABNORMAL LOW (ref 8.4–10.5)
GFR calc non Af Amer: >90 mL/min (ref >90)
Glucose, Bld: 56 mg/dL — ABNORMAL LOW (ref 70–99)
Potassium: 3.1 mEq/L — ABNORMAL LOW (ref 3.5–5.1)

## 2013-08-19 LAB — DIFFERENTIAL
Basophils Absolute: 0 10*3/uL (ref 0.0–0.1)
Eosinophils Absolute: 0 10*3/uL (ref 0.0–0.7)
Eosinophils Relative: 0 % (ref 0–5)
Neutro Abs: 2.7 10*3/uL (ref 1.7–7.7)

## 2013-08-19 LAB — HEMOGLOBIN A1C
Hgb A1c MFr Bld: 7.1 % — ABNORMAL HIGH (ref <5.7)
Mean Plasma Glucose: 157 mg/dL — ABNORMAL HIGH (ref <117)

## 2013-08-19 LAB — GLUCOSE, CAPILLARY
Glucose-Capillary: 53 mg/dL — ABNORMAL LOW (ref 70–99)
Glucose-Capillary: 147 mg/dL — ABNORMAL HIGH (ref 70–99)
Glucose-Capillary: 134 mg/dL — ABNORMAL HIGH (ref 70–99)
Glucose-Capillary: 153 mg/dL — ABNORMAL HIGH (ref 70–99)
Glucose-Capillary: 107 mg/dL — ABNORMAL HIGH (ref 70–99)
Glucose-Capillary: 201 mg/dL — ABNORMAL HIGH (ref 70–99)

## 2013-08-19 LAB — CBC
HCT: 33.2 % — ABNORMAL LOW (ref 39.0–52.0)
Hemoglobin: 12.2 g/dL — ABNORMAL LOW (ref 13.0–17.0)
MCH: 34.5 pg — ABNORMAL HIGH (ref 26.0–34.0)
MCHC: 36.7 g/dL — ABNORMAL HIGH (ref 30.0–36.0)
Platelets: 78 10*3/uL — ABNORMAL LOW (ref 150–400)
RDW: 13.8 % (ref 11.5–15.5)

## 2013-08-19 LAB — PROTIME-INR
INR: 0.99 (ref 0.00–1.49)
Prothrombin Time: 12.9 seconds (ref 11.6–15.2)

## 2013-08-19 MED ORDER — INSULIN GLARGINE 100 UNIT/ML ~~LOC~~ SOLN
45.0000 [IU] | Freq: Every day | SUBCUTANEOUS | Status: DC
  Filled 2013-08-19: qty 0.45

## 2013-08-19 MED ORDER — AMLODIPINE BESYLATE 2.5 MG PO TABS
2.5000 mg | ORAL_TABLET | Freq: Every day | ORAL | Status: DC
  Administered 2013-08-19: 2.5 mg via ORAL
  Filled 2013-08-19 (×2): qty 1

## 2013-08-19 MED ORDER — POTASSIUM CHLORIDE CRYS ER 20 MEQ PO TBCR
20.0000 meq | EXTENDED_RELEASE_TABLET | Freq: Two times a day (BID) | ORAL | Status: DC
  Administered 2013-08-19 – 2013-08-25 (×11): 20 meq via ORAL
  Filled 2013-08-19 (×17): qty 1

## 2013-08-19 MED ORDER — LISINOPRIL 40 MG PO TABS
40.0000 mg | ORAL_TABLET | Freq: Every day | ORAL | Status: DC
  Administered 2013-08-19 – 2013-08-25 (×7): 40 mg via ORAL
  Filled 2013-08-19 (×7): qty 1

## 2013-08-19 MED ORDER — INSULIN GLARGINE 100 UNIT/ML ~~LOC~~ SOLN
20.0000 [IU] | Freq: Two times a day (BID) | SUBCUTANEOUS | Status: DC
  Administered 2013-08-19 – 2013-08-25 (×11): 20 [IU] via SUBCUTANEOUS
  Filled 2013-08-19 (×14): qty 0.2

## 2013-08-19 MED ORDER — INSULIN GLARGINE 100 UNIT/ML ~~LOC~~ SOLN
40.0000 [IU] | Freq: Every day | SUBCUTANEOUS | Status: DC
  Filled 2013-08-19: qty 0.4

## 2013-08-19 MED ORDER — POTASSIUM CHLORIDE IN NACL 20-0.9 MEQ/L-% IV SOLN
INTRAVENOUS | Status: DC
  Administered 2013-08-19 – 2013-08-24 (×9): via INTRAVENOUS
  Filled 2013-08-19 (×13): qty 1000

## 2013-08-19 MED ORDER — PNEUMOCOCCAL VAC POLYVALENT 25 MCG/0.5ML IJ INJ
0.5000 mL | INJECTION | INTRAMUSCULAR | Status: AC
  Administered 2013-08-20: 0.5 mL via INTRAMUSCULAR
  Filled 2013-08-19: qty 0.5

## 2013-08-19 NOTE — Consult Note (Signed)
[facility] PROGRESS NOTE  Ernest [name] ZOX:[id number] DOB: 01/24/1956 DOA: 08/17/2013 PCP: No PCP Per Patient  Assessment/Plan: 57 y/o male with PMH of IDDM, HTN, admitted with arm abscess  1. Abscess of right arm; per orthopedics   2. DM uncontrolled; HA1C-7.1; likely elevated due to infection; episodes of hypoglycemia  -Hold Oral meds in hospital  -decrease lantus to 20 units BID; +ISS  3. HTN uncontrolled; on HCTZ, Lisinopril, Inderal  -increase lisinopril to 40; add amlodipine; hold hctz due to hypo Na, hypo K;   4. Thrombocytopenia improving; LFTs elevated in 2011; elevated bili; ? etoh related;  Patient reports he may have been told in the past that he has low platelets (uncertain)  -obtain liver US;   5. Hypo Na, hypo K likely due to diuretics; hold diuretics; replace lytes;    Code Status: full Family Communication: none at the bedside (indicate person spoken with, relationship, and if by phone, the number) Disposition Plan: per primary, when ready    Consultants:    Procedures:  I&D   Antibiotics:  Zosyn per primary  (indicate start date, and stop date if known)  HPI/Subjective: alert  Objective: Filed Vitals:   08/19/13 0445  BP: 163/90  Pulse: 87  Temp: 99.5 F (37.5 C)  Resp: 20    Intake/Output Summary (Last 24 hours) at 08/19/13 0953 Last data filed at 08/19/13 0900  Gross per 24 hour  Intake   1440 ml  Output      0 ml  Net   1440 ml   Filed Weights   08/17/13 2027 08/18/13 0441  Weight: 85.73 kg (189 lb) 89.086 kg (196 lb 6.4 oz)    Exam:   General:  Alert  Cardiovascular: s1,s2 rrr  Respiratory: CTA bl  Abdomen: soft, nt nd   Musculoskeletal: no LE edema    Data Reviewed: Basic Metabolic Panel:  Recent Labs Lab 08/17/13 2159 08/19/13 0435  NA 134* 129*  K 4.0 3.1*  CL 94* 90*  CO2 27 25  GLUCOSE 195* 56*  BUN 8 7  CREATININE 0.80 0.87  CALCIUM 8.5 8.1*   Liver Function Tests:  Recent Labs Lab  08/19/13 0435  AST 118*  ALT 42  ALKPHOS 107  BILITOT 2.0*  PROT 7.2  ALBUMIN 2.3*   No results found for this basename: LIPASE, AMYLASE,  in the last 168 hours No results found for this basename: AMMONIA,  in the last 168 hours CBC:  Recent Labs Lab 08/18/13 0125 08/19/13 0435  WBC 3.1* 5.5  NEUTROABS 1.3* 2.7  HGB 13.8 12.2*  HCT 38.4* 33.2*  MCV 92.5 93.8  PLT 57* 78*   Cardiac Enzymes: No results found for this basename: CKTOTAL, CKMB, CKMBINDEX, TROPONINI,  in the last 168 hours BNP (last 3 results) No results found for this basename: PROBNP,  in the last 8760 hours CBG:  Recent Labs Lab 08/18/13 2147 08/18/13 2221 08/19/13 0419 08/19/13 0501 08/19/13 0801  GLUCAP 52* 92 53* 147* 134*    Recent Results (from the past 240 hour(s))  ANAEROBIC CULTURE     Status: None   Collection Time    08/18/13  2:33 AM      Result Value Range Status   Specimen Description ABSCESS ARM RIGHT   Final   Special Requests PT ON VANCOMYCIN   Final   Gram Stain     Final   Value: MODERATE WBC PRESENT, PREDOMINANTLY PMN     NO SQUAMOUS EPITHELIAL CELLS SEEN  ABUNDANT GRAM POSITIVE COCCI IN PAIRS     Performed at [facility]   Culture PENDING   Incomplete   Report Status PENDING   Incomplete  CULTURE, ROUTINE-ABSCESS     Status: None   Collection Time    08/18/13  2:33 AM      Result Value Range Status   Specimen Description ABSCESS ARM RIGHT   Final   Special Requests PT ON VANCOMYCIN   Final   Gram Stain     Final   Value: MODERATE WBC PRESENT, PREDOMINANTLY PMN     NO SQUAMOUS EPITHELIAL CELLS SEEN     ABUNDANT GRAM POSITIVE COCCI IN PAIRS     Performed at [facility]   Culture     Final   Value: MODERATE STAPHYLOCOCCUS AUREUS     Note: RIFAMPIN AND GENTAMICIN SHOULD NOT BE USED AS SINGLE DRUGS FOR TREATMENT OF STAPH INFECTIONS.     Performed at [facility]   Report Status PENDING   Incomplete     Studies: Ct Forearm Right W  Contrast  08/17/2013   CLINICAL DATA:  Forearm swelling for 3 days, no injury.  EXAM: CT OF THE RIGHT FOREARM WITH CONTRAST  TECHNIQUE: Multidetector CT imaging was performed following the standard protocol during bolus administration of intravenous contrast.  CONTRAST:  OMNIPAQUE IOHEXOL 300 MG/ML  SOLN  COMPARISON:  None available for comparison at time of study interpretation.  FINDINGS: 13 x 9 x 20 mm (transverse by AP by CC) rim enhancing fluid collection within the brachioradialis muscle, approximately 4 cm distal to the antecubital fossa consistent with abscess. The muscle appears mildly hypodense most consistent with edema. Mild reticulation of the associated overlying subcutaneous fat suggests cellulitis. No additional fluid collection seen. Tiny filling defect within an anterior forearm vein, axial 8/133 may reflect a tiny thrombus.  No fracture dislocation.  No destructive bony lesions.  IMPRESSION: 13 x 9 x 20 mm fluid collection within the brachioradialis muscle concerning for abscess with muscle edema. Tiny filling defect within an anterior forearm vein concerning for thrombus. Minimal inflammatory changes of the subcutaneous fat could reflect cellulitis.   Electronically Signed   By: Awilda Metro   On: 08/17/2013 23:14    Scheduled Meds: . docusate sodium  100 mg Oral BID  . folic acid  1 mg Oral Daily  . hydrocerin   Topical BID  . hydrochlorothiazide  25 mg Oral Daily  . insulin aspart  0-15 Units Subcutaneous TID WC  . insulin glargine  40 Units Subcutaneous Daily  . insulin glargine  45 Units Subcutaneous QHS  . lisinopril  20 mg Oral Daily  . multivitamin with minerals  1 tablet Oral Daily  . pantoprazole  40 mg Oral Daily  . piperacillin-tazobactam (ZOSYN)  IV  3.375 g Intravenous Q8H  . propranolol  40 mg Oral BID  . thiamine  100 mg Oral Daily  . vancomycin  1,000 mg Intravenous Q8H  . vitamin C  1,000 mg Oral Daily   Continuous Infusions: . lactated ringers  75 mL/hr at 08/18/13 1824    Principal Problem:   Abscess of right arm Active Problems:   HTN (hypertension)   Type II or unspecified type diabetes mellitus with unspecified complication, uncontrolled   Thrombocytopenia, unspecified    Time spent: >35 minutes     Esperanza Sheets  [facility] Pager [phone]. If 7PM-7AM, please contact night-coverage at [email or web address], password [facility] 08/19/2013, 9:53 AM  LOS: 2  days

## 2013-08-19 NOTE — Progress Notes (Signed)
Subjective: 1 Day Post-Op Procedure(s) (LRB): IRRIGATION AND DEBRIDEMENT EXTREMITY (Right) Patient reports pain as mild he is alert and oriented. His tolerating a regular diet. He states he can move his fingers nicely.  We are awaiting cultures..    Objective: Vital signs in last 24 hours: Temp:  [99.4 F (37.4 C)-99.9 F (37.7 C)] 99.4 F (37.4 C) (11/20 1338) Pulse Rate:  [83-109] 83 (11/20 1338) Resp:  [18-20] 18 (11/20 1338) BP: (143-170)/(83-95) 149/83 mmHg (11/20 1338) SpO2:  [98 %-100 %] 100 % (11/20 1338)  Intake/Output from previous day: 11/19 0701 - 11/20 0700 In: 1320 [P.O.:120; I.V.:1200] Out: -  Intake/Output this shift: Total I/O In: 360 [P.O.:360] Out: 400 [Urine:400]   Recent Labs  08/18/13 0125 08/19/13 0435  HGB 13.8 12.2*    Recent Labs  08/18/13 0125 08/19/13 0435  WBC 3.1* 5.5  RBC 4.15* 3.54*  HCT 38.4* 33.2*  PLT 57* 78*    Recent Labs  08/17/13 2159 08/19/13 0435  NA 134* 129*  K 4.0 3.1*  CL 94* 90*  CO2 27 25  BUN 8 7  CREATININE 0.80 0.87  GLUCOSE 195* 56*  CALCIUM 8.5 8.1*    Recent Labs  08/19/13 0435  INR 0.99    Physical exam:  Patient was seen HEENT skin no limits. Abdomen is nontender. Lower stem examination is benign. Left upper extremity is neurovascular intact.  His dressing is changed at bedside. I removed the drain. There is no foul discharge no other issues. He unfortunately does continue to have swelling and some erythema and cellulitis in the arm. Hopefully this will resolve with IV antibiotics and close observation. I discussed and these issues at length. I would not hesitate to consider an additional washout of necessary. He understands this. There is no signs of DVT compartment syndrome or necrotizing fasciitis.  Assessment/Plan: 1 Day Post-Op Procedure(s) (LRB): IRRIGATION AND DEBRIDEMENT EXTREMITY (Right) Patient is stable. I am concerned in regards to his continued cellulitis. The patient likely  has a significant swelling and skin reaction secondary to decompression of the abscess.  His drain was removed at bedside. there is no active discharge. he is neurovascularly intact. I would continue antibiotics in an aggressive fashion and await final cultures.  I would consider repeat washout if he does not demonstrate improvement tomorrow. I discussed and these issues at length.  I appreciate medicine seen him  Overall I do have concerns given the unusual nature of his infection and he continues soft tissue swelling. He understands this.  Karen Chafe 08/19/2013, 7:45 PM

## 2013-08-19 NOTE — Evaluation (Signed)
Occupational Therapy Evaluation Patient Details Name: Ernest [name] MRN: [id number] DOB: [birth year] Today's Date: 08/19/2013 Time: 8295-6213 OT Time Calculation (min): 8 min  OT Assessment / Plan / Recommendation History of present illness 57 yo male admitted due to pain and swelling in his right forearm. He states that last Thursday he began having difficulties. This prompted [facility] He was seen at [facility] and referred to [facility] for further care. He has a CT scan showing a fluid collection in his proximal forearm in the region of his brachioradialis muscle. He denies high fever. Pt s/p I and D, deep abscess, right forearm,Radial nerve exploration neurolysis, right forearm, andExcision, necrotic brachioradialis muscle and mobile wad muscle, right forearm.   Clinical Impression   Patient evaluated by Occupational Therapy with no further acute OT needs identified. All education has been completed and the patient has no further questions. See below for any follow-up Occupational Therapy or equipment needs. OT to sign off. Thank you for referral.      OT Assessment  Patient does not need any further OT services    Follow Up Recommendations  No OT follow up    Barriers to Discharge      Equipment Recommendations  None recommended by OT    Recommendations for Other Services    Frequency       Precautions / Restrictions Precautions Precautions: None   Pertinent Vitals/Pain None to report     ADL  Eating/Feeding: Independent Where Assessed - Eating/Feeding: Bed level Grooming: Wash/dry hands;Independent Where Assessed - Grooming: Supported sitting ADL Comments: pt supine in bed finishing breakfast on arrival. pt educated on role of OT. Pt declined OOB this session. pt educated on elevation and AROM. pt educated on the use of electric tooth brush in left hand to help with oral care.  Pt educated on pillow positioning at home to help achieve proper alignment. Pt educated on dressing Rt UE first. Pt educated on peri hygiene. Pt without further questions    OT Diagnosis:    OT Problem List:   OT Treatment Interventions:     OT Goals(Current goals can be found in the care plan section)    Visit Information  Last OT Received On: 08/19/13 History of Present Illness: 57 yo male admitted due to pain and swelling in his right forearm. He states that last Thursday he began having difficulties. This prompted a visit to the [facility]. [facility] He was seen at [facility] and referred to [facility] for further care. He has a CT scan showing a fluid collection in his proximal forearm in the region of his brachioradialis muscle. He denies high fever. Pt s/p I and D, deep abscess, right forearm,Radial nerve exploration neurolysis, right forearm, andExcision, necrotic brachioradialis muscle and mobile wad muscle, right forearm.       Prior Functioning     Home Living Family/patient expects to be discharged to:: Private residence Living Arrangements: Spouse/significant other Type of Home: House Home Access: Level entry Home Layout: One level Prior Function Level of Independence: Independent Communication Communication: No difficulties Dominant Hand: Right         Vision/Perception Vision - History Patient Visual Report: No change from baseline   Cognition  Cognition Arousal/Alertness: Awake/alert Behavior During Therapy: WFL for tasks assessed/performed Overall  Cognitive Status: Within Functional Limits for tasks assessed    Extremity/Trunk Assessment Upper Extremity Assessment Upper Extremity Assessment: RUE deficits/detail RUE Deficits / Details: Pt currently with ace wrap dressing on RT forearm area and edema present. Pt educated on evaluation , AROM, and massage. Pt (A)ed back into sling for  elevation. pt declined ice. Pt reports normal sensation in hand Lower Extremity Assessment Lower Extremity Assessment: Defer to PT evaluation     Mobility Bed Mobility Bed Mobility: Not assessed Transfers Transfers: Not assessed     Exercise     Balance     End of Session OT - End of Session Activity Tolerance: Patient tolerated treatment well Patient left: in bed;with call bell/phone within reach  GO     Harolyn Rutherford 08/19/2013, 8:49 AM Pager: [phone]

## 2013-08-20 ENCOUNTER — Encounter (HOSPITAL_COMMUNITY): Payer: Self-pay | Admitting: Orthopedic Surgery

## 2013-08-20 ENCOUNTER — Encounter (HOSPITAL_COMMUNITY)
Admission: EM | Disposition: A | Discharge status: Home / Self Care | Payer: Self-pay | Source: Home / Self Care | Attending: Orthopedic Surgery

## 2013-08-20 ENCOUNTER — Encounter (HOSPITAL_COMMUNITY): Payer: Medicare Other | Admitting: Certified Registered Nurse Anesthetist

## 2013-08-20 ENCOUNTER — Inpatient Hospital Stay (HOSPITAL_COMMUNITY): Payer: Medicare Other | Admitting: Certified Registered Nurse Anesthetist

## 2013-08-20 DIAGNOSIS — K7460 Unspecified cirrhosis of liver: Secondary | ICD-10-CM

## 2013-08-20 HISTORY — PX: APPLICATION OF WOUND VAC: SHX5189

## 2013-08-20 HISTORY — PX: I & D EXTREMITY: SHX5045

## 2013-08-20 LAB — COMPREHENSIVE METABOLIC PANEL
Albumin: 1.9 g/dL — ABNORMAL LOW (ref 3.5–5.2)
Alkaline Phosphatase: 85 U/L (ref 39–117)
BUN: 5 mg/dL — ABNORMAL LOW (ref 6–23)
CO2: 24 mEq/L (ref 19–32)
Creatinine, Ser: 0.77 mg/dL (ref 0.50–1.35)
GFR calc Af Amer: >90 mL/min (ref >90)
GFR calc non Af Amer: >90 mL/min (ref >90)
Glucose, Bld: 94 mg/dL (ref 70–99)
Potassium: 3.6 mEq/L (ref 3.5–5.1)
Total Bilirubin: 1.7 mg/dL — ABNORMAL HIGH (ref 0.3–1.2)
Total Protein: 6.2 g/dL (ref 6.0–8.3)

## 2013-08-20 LAB — HEPATITIS PANEL, ACUTE
Hep A IgM: NONREACTIVE
Hep B C IgM: NONREACTIVE
Hepatitis B Surface Ag: NEGATIVE

## 2013-08-20 LAB — CBC WITH DIFFERENTIAL/PLATELET
Basophils Relative: 0 % (ref 0–1)
Eosinophils Absolute: 0 10*3/uL (ref 0.0–0.7)
Hemoglobin: 11.2 g/dL — ABNORMAL LOW (ref 13.0–17.0)
Lymphocytes Relative: 49 % — ABNORMAL HIGH (ref 12–46)
Lymphs Abs: 1.9 10*3/uL (ref 0.7–4.0)
MCH: 32.8 pg (ref 26.0–34.0)
MCHC: 35.3 g/dL (ref 30.0–36.0)
Monocytes Relative: 11 % (ref 3–12)
Neutro Abs: 1.5 10*3/uL — ABNORMAL LOW (ref 1.7–7.7)
Neutrophils Relative %: 40 % — ABNORMAL LOW (ref 43–77)
RBC: 3.41 MIL/uL — ABNORMAL LOW (ref 4.22–5.81)
WBC: 3.8 10*3/uL — ABNORMAL LOW (ref 4.0–10.5)

## 2013-08-20 LAB — GLUCOSE, CAPILLARY
Glucose-Capillary: 90 mg/dL (ref 70–99)
Glucose-Capillary: 153 mg/dL — ABNORMAL HIGH (ref 70–99)
Glucose-Capillary: 89 mg/dL (ref 70–99)

## 2013-08-20 LAB — CULTURE, ROUTINE-ABSCESS

## 2013-08-20 SURGERY — IRRIGATION AND DEBRIDEMENT EXTREMITY
Anesthesia: General | Site: Arm Lower | Laterality: Right | Wound class: Dirty or Infected

## 2013-08-20 MED ORDER — POTASSIUM CHLORIDE CRYS ER 20 MEQ PO TBCR: 10.0000 meq | EXTENDED_RELEASE_TABLET | Freq: Every day | ORAL | Status: DC

## 2013-08-20 MED ORDER — ONDANSETRON HCL 4 MG/2ML IJ SOLN
INTRAMUSCULAR | Status: DC | PRN
  Administered 2013-08-20: 4 mg via INTRAVENOUS

## 2013-08-20 MED ORDER — LIDOCAINE HCL (CARDIAC) 20 MG/ML IV SOLN
INTRAVENOUS | Status: DC | PRN
  Administered 2013-08-20: 100 mg via INTRAVENOUS

## 2013-08-20 MED ORDER — FENTANYL CITRATE 0.05 MG/ML IJ SOLN: 25.0000 ug | INTRAMUSCULAR | Status: DC | PRN

## 2013-08-20 MED ORDER — SUCCINYLCHOLINE CHLORIDE 20 MG/ML IJ SOLN
INTRAMUSCULAR | Status: DC | PRN
  Administered 2013-08-20: 140 mg via INTRAVENOUS

## 2013-08-20 MED ORDER — SODIUM CHLORIDE 0.9 % IR SOLN
Status: DC | PRN
  Administered 2013-08-20: 5000 mL

## 2013-08-20 MED ORDER — OXYCODONE HCL 5 MG/5ML PO SOLN: 5.0000 mg | Freq: Once | ORAL | Status: DC | PRN

## 2013-08-20 MED ORDER — CEFAZOLIN SODIUM 1-5 GM-% IV SOLN
1.0000 g | Freq: Three times a day (TID) | INTRAVENOUS | Status: DC
  Administered 2013-08-20 – 2013-08-25 (×14): 1 g via INTRAVENOUS
  Filled 2013-08-20 (×18): qty 50

## 2013-08-20 MED ORDER — AMLODIPINE BESYLATE 5 MG PO TABS
5.0000 mg | ORAL_TABLET | Freq: Every day | ORAL | Status: DC
  Administered 2013-08-20: 5 mg via ORAL
  Filled 2013-08-20 (×2): qty 1

## 2013-08-20 MED ORDER — CEFAZOLIN SODIUM 1-5 GM-% IV SOLN: 1.0000 g | Freq: Three times a day (TID) | INTRAVENOUS | Status: DC

## 2013-08-20 MED ORDER — OXYCODONE HCL 5 MG PO TABS: 5.0000 mg | ORAL_TABLET | Freq: Once | ORAL | Status: DC | PRN

## 2013-08-20 MED ORDER — AMLODIPINE BESYLATE 5 MG PO TABS: 5.0000 mg | ORAL_TABLET | Freq: Every day | ORAL | Status: DC

## 2013-08-20 MED ORDER — SUFENTANIL CITRATE 50 MCG/ML IV SOLN
INTRAVENOUS | Status: DC | PRN
  Administered 2013-08-20: 10 ug via INTRAVENOUS

## 2013-08-20 MED ORDER — PROPOFOL 10 MG/ML IV BOLUS
INTRAVENOUS | Status: DC | PRN
  Administered 2013-08-20: 150 mg via INTRAVENOUS

## 2013-08-20 MED ORDER — ONDANSETRON HCL 4 MG/2ML IJ SOLN
4.0000 mg | Freq: Four times a day (QID) | INTRAMUSCULAR | Status: DC | PRN
Start: 2013-08-20 — End: 2013-08-20

## 2013-08-20 MED ORDER — INSULIN GLARGINE 100 UNIT/ML ~~LOC~~ SOLN: 20.0000 [IU] | Freq: Two times a day (BID) | SUBCUTANEOUS | Status: DC

## 2013-08-20 MED ORDER — MIDAZOLAM HCL 5 MG/5ML IJ SOLN
INTRAMUSCULAR | Status: DC | PRN
  Administered 2013-08-20: 2 mg via INTRAVENOUS

## 2013-08-20 MED ORDER — LACTATED RINGERS IV SOLN
INTRAVENOUS | Status: DC | PRN
  Administered 2013-08-20: 21:00:00 via INTRAVENOUS

## 2013-08-20 MED ORDER — LISINOPRIL-HYDROCHLOROTHIAZIDE 20-25 MG PO TABS: 1.0000 | ORAL_TABLET | Freq: Every day | ORAL | Status: DC

## 2013-08-20 SURGICAL SUPPLY — 46 items
BANDAGE CONFORM 2  STR LF (GAUZE/BANDAGES/DRESSINGS) IMPLANT
BANDAGE ELASTIC 4 VELCRO ST LF (GAUZE/BANDAGES/DRESSINGS) ×5 IMPLANT
BANDAGE GAUZE ELAST BULKY 4 IN (GAUZE/BANDAGES/DRESSINGS) ×5 IMPLANT
CANISTER WOUND CARE 500ML ATS (WOUND CARE) ×1 IMPLANT
CLOTH BEACON ORANGE TIMEOUT ST (SAFETY) ×2 IMPLANT
CORDS BIPOLAR (ELECTRODE) ×2 IMPLANT
CUFF TOURNIQUET SINGLE 18IN (TOURNIQUET CUFF) ×1 IMPLANT
CUFF TOURNIQUET SINGLE 24IN (TOURNIQUET CUFF) ×1 IMPLANT
DRSG ADAPTIC 3X8 NADH LF (GAUZE/BANDAGES/DRESSINGS) ×1 IMPLANT
DRSG VAC ATS SM SENSATRAC (GAUZE/BANDAGES/DRESSINGS) ×1 IMPLANT
ELECT REM PT RETURN 9FT ADLT (ELECTROSURGICAL)
ELECTRODE REM PT RTRN 9FT ADLT (ELECTROSURGICAL) IMPLANT
GAUZE XEROFORM 1X8 LF (GAUZE/BANDAGES/DRESSINGS) ×1 IMPLANT
GAUZE XEROFORM 5X9 LF (GAUZE/BANDAGES/DRESSINGS) ×3 IMPLANT
GLOVE BIOGEL M STRL SZ7.5 (GLOVE) ×2 IMPLANT
GLOVE SS BIOGEL STRL SZ 8 (GLOVE) ×1 IMPLANT
GLOVE SUPERSENSE BIOGEL SZ 8 (GLOVE) ×1
GOWN PREVENTION PLUS XLARGE (GOWN DISPOSABLE) ×2 IMPLANT
GOWN STRL NON-REIN LRG LVL3 (GOWN DISPOSABLE) ×6 IMPLANT
GOWN STRL REIN XL XLG (GOWN DISPOSABLE) ×4 IMPLANT
HANDPIECE INTERPULSE COAX TIP (DISPOSABLE)
KIT BASIN OR (CUSTOM PROCEDURE TRAY) ×2 IMPLANT
KIT ROOM TURNOVER OR (KITS) ×2 IMPLANT
MANIFOLD NEPTUNE II (INSTRUMENTS) ×2 IMPLANT
NDL HYPO 25GX1X1/2 BEV (NEEDLE) IMPLANT
NEEDLE HYPO 25GX1X1/2 BEV (NEEDLE) IMPLANT
NS IRRIG 1000ML POUR BTL (IV SOLUTION) ×2 IMPLANT
PACK ORTHO EXTREMITY (CUSTOM PROCEDURE TRAY) ×2 IMPLANT
PAD ARMBOARD 7.5X6 YLW CONV (MISCELLANEOUS) ×4 IMPLANT
PAD CAST 4YDX4 CTTN HI CHSV (CAST SUPPLIES) ×1 IMPLANT
PADDING CAST ABS 4INX4YD NS (CAST SUPPLIES) ×1
PADDING CAST ABS COTTON 4X4 ST (CAST SUPPLIES) IMPLANT
PADDING CAST COTTON 4X4 STRL (CAST SUPPLIES) ×4
SET HNDPC FAN SPRY TIP SCT (DISPOSABLE) IMPLANT
SPLINT FIBERGLASS 4X30 (CAST SUPPLIES) ×1 IMPLANT
SPONGE GAUZE 4X4 12PLY (GAUZE/BANDAGES/DRESSINGS) ×1 IMPLANT
SPONGE LAP 18X18 X RAY DECT (DISPOSABLE) ×1 IMPLANT
SPONGE LAP 4X18 X RAY DECT (DISPOSABLE) ×2 IMPLANT
SYR CONTROL 10ML LL (SYRINGE) IMPLANT
TOWEL OR 17X24 6PK STRL BLUE (TOWEL DISPOSABLE) ×2 IMPLANT
TOWEL OR 17X26 10 PK STRL BLUE (TOWEL DISPOSABLE) ×2 IMPLANT
TUBE ANAEROBIC SPECIMEN COL (MISCELLANEOUS) IMPLANT
TUBE CONNECTING 12X1/4 (SUCTIONS) ×2 IMPLANT
TUBING TUR DISP (UROLOGICAL SUPPLIES) ×1 IMPLANT
WATER STERILE IRR 1000ML POUR (IV SOLUTION) ×2 IMPLANT
YANKAUER SUCT BULB TIP NO VENT (SUCTIONS) ×2 IMPLANT

## 2013-08-20 NOTE — Consult Note (Signed)
[facility] PROGRESS NOTE  Ernest [name] ZOX:[id number] DOB: 1955-12-04 DOA: 08/17/2013 PCP: No PCP Per Patient  Assessment/Plan: 57 y/o male with PMH of IDDM, HTN, admitted with arm abscess  1. Abscess of right arm; per orthopedics   2. DM uncontrolled; HA1C-7.1; likely elevated due to infection; episodes of hypoglycemia  -resume Oral meds upon discharge  -decreased lantus to 20 units BID;   3. HTN uncontrolled; on HCTZ, Lisinopril, Inderal; add kcl Po  -add amlodipine;   4. Liver cirrhosis; per patient he was Dx with cirrhosis in the past; ? Hepatitis vs etoh related  -Thrombocytopenia, LFTs elevated in 2011; US showed changes with cirrhosis;  -patient reports having gastroenterologist who f/u fo cirrhosis; recommended to cont close outpatient follow up; he wants to f/u outpatient with his GI physician    5. Hypo Na, hypo K likely due to diuretics;improved; added potassium   D/c plans per primary service   Code Status: full Family Communication: none at the bedside (indicate person spoken with, relationship, and if by phone, the number) Disposition Plan: per primary, when ready    Consultants:    Procedures:  I&D   Antibiotics:  Zosyn per primary  (indicate start date, and stop date if known)  HPI/Subjective: alert  Objective: Filed Vitals:   08/20/13 0543  BP: 161/87  Pulse: 76  Temp: 98.4 F (36.9 C)  Resp: 18    Intake/Output Summary (Last 24 hours) at 08/20/13 1014 Last data filed at 08/20/13 0519  Gross per 24 hour  Intake 2776.25 ml  Output   1900 ml  Net 876.25 ml   Filed Weights   08/17/13 2027 08/18/13 0441  Weight: 85.73 kg (189 lb) 89.086 kg (196 lb 6.4 oz)    Exam:   General:  Alert  Cardiovascular: s1,s2 rrr  Respiratory: CTA bl  Abdomen: soft, nt nd   Musculoskeletal: no LE edema    Data Reviewed: Basic Metabolic Panel:  Recent Labs Lab 08/17/13 2159 08/19/13 0435 08/20/13 0709  NA 134* 129* 133*  K 4.0  3.1* 3.6  CL 94* 90* 99  CO2 27 25 24   GLUCOSE 195* 56* 94  BUN 8 7 5*  CREATININE 0.80 0.87 0.77  CALCIUM 8.5 8.1* 7.4*   Liver Function Tests:  Recent Labs Lab 08/19/13 0435 08/20/13 0709  AST 118* 122*  ALT 42 37  ALKPHOS 107 85  BILITOT 2.0* 1.7*  PROT 7.2 6.2  ALBUMIN 2.3* 1.9*   No results found for this basename: LIPASE, AMYLASE,  in the last 168 hours No results found for this basename: AMMONIA,  in the last 168 hours CBC:  Recent Labs Lab 08/18/13 0125 08/19/13 0435 08/20/13 0709  WBC 3.1* 5.5 3.8*  NEUTROABS 1.3* 2.7 1.5*  HGB 13.8 12.2* 11.2*  HCT 38.4* 33.2* 31.7*  MCV 92.5 93.8 93.0  PLT 57* 78* 59*   Cardiac Enzymes: No results found for this basename: CKTOTAL, CKMB, CKMBINDEX, TROPONINI,  in the last 168 hours BNP (last 3 results) No results found for this basename: PROBNP,  in the last 8760 hours CBG:  Recent Labs Lab 08/19/13 1217 08/19/13 1314 08/19/13 1714 08/19/13 2131 08/20/13 0747  GLUCAP 153* 142* 107* 201* 90    Recent Results (from the past 240 hour(s))  AFB CULTURE WITH SMEAR     Status: None   Collection Time    08/18/13  2:33 AM      Result Value Range Status   Specimen Description ABSCESS ARM RIGHT   Final  Special Requests PT ON VANCOMYCIN   Final   ACID FAST SMEAR     Final   Value: NO ACID FAST BACILLI SEEN     Performed at [facility]   Culture     Final   Value: CULTURE WILL BE EXAMINED FOR 6 WEEKS BEFORE ISSUING A FINAL REPORT     Performed at [facility]   Report Status PENDING   Incomplete  ANAEROBIC CULTURE     Status: None   Collection Time    08/18/13  2:33 AM      Result Value Range Status   Specimen Description ABSCESS ARM RIGHT   Final   Special Requests PT ON VANCOMYCIN   Final   Gram Stain     Final   Value: MODERATE WBC PRESENT, PREDOMINANTLY PMN     NO SQUAMOUS EPITHELIAL CELLS SEEN     ABUNDANT GRAM POSITIVE COCCI IN PAIRS     Performed at [facility]   Culture      Final   Value: NO ANAEROBES ISOLATED; CULTURE IN PROGRESS FOR 5 DAYS     Performed at [facility]   Report Status PENDING   Incomplete  CULTURE, ROUTINE-ABSCESS     Status: None   Collection Time    08/18/13  2:33 AM      Result Value Range Status   Specimen Description ABSCESS ARM RIGHT   Final   Special Requests PT ON VANCOMYCIN   Final   Gram Stain     Final   Value: MODERATE WBC PRESENT, PREDOMINANTLY PMN     NO SQUAMOUS EPITHELIAL CELLS SEEN     ABUNDANT GRAM POSITIVE COCCI IN PAIRS     Performed at [facility]   Culture     Final   Value: MODERATE STAPHYLOCOCCUS AUREUS     Note: RIFAMPIN AND GENTAMICIN SHOULD NOT BE USED AS SINGLE DRUGS FOR TREATMENT OF STAPH INFECTIONS.     Performed at [facility]   Report Status 08/20/2013 FINAL   Final   Organism ID, Bacteria STAPHYLOCOCCUS AUREUS   Final     Studies: US Abdomen Complete  08/19/2013   CLINICAL DATA:  Evaluate for cirrhosis  EXAM: ULTRASOUND ABDOMEN COMPLETE  COMPARISON:  None.  FINDINGS: Gallbladder  Stone within the lumen of the gallbladder measures 1.7 cm. No gallbladder wall thickening, pericholecystic fluid or positive sonographic Murphy's sign.  Common bile duct  Diameter: 3.3 mm  Liver  The liver has a nodular contour. The echotexture of the liver is somewhat coarsened. No focal mass lesion identified.  IVC  No abnormality visualized.  Pancreas  Visualized portion unremarkable.  Spleen  Size and appearance within normal limits.  Right Kidney  Length: 13.3 cm. Echogenicity within normal limits. No mass or hydronephrosis visualized.  Left Kidney  Length: 12.8 cm. Echogenicity within normal limits. No mass or hydronephrosis visualized.  Abdominal aorta  No aneurysm visualized.  IMPRESSION: 1. No acute findings. 2. Morphologic features of the liver suggestive of cirrhosis. 3. Gallstone.   Electronically Signed   By: Signa Kell M.D.   On: 08/19/2013 16:49    Scheduled Meds: . amLODipine  2.5 mg  Oral Daily  . docusate sodium  100 mg Oral BID  . folic acid  1 mg Oral Daily  . hydrocerin   Topical BID  . insulin aspart  0-15 Units Subcutaneous TID WC  . insulin glargine  20 Units Subcutaneous BID  . lisinopril  40  mg Oral Daily  . multivitamin with minerals  1 tablet Oral Daily  . pantoprazole  40 mg Oral Daily  . piperacillin-tazobactam (ZOSYN)  IV  3.375 g Intravenous Q8H  . pneumococcal 23 valent vaccine  0.5 mL Intramuscular Tomorrow-1000  . potassium chloride  20 mEq Oral BID  . propranolol  40 mg Oral BID  . thiamine  100 mg Oral Daily  . vancomycin  1,000 mg Intravenous Q8H  . vitamin C  1,000 mg Oral Daily   Continuous Infusions: . 0.9 % NaCl with KCl 20 mEq / L 75 mL/hr at 08/20/13 0234    Principal Problem:   Abscess of right arm Active Problems:   HTN (hypertension)   Type II or unspecified type diabetes mellitus with unspecified complication, uncontrolled   Thrombocytopenia, unspecified    Time spent: >35 minutes     Esperanza Sheets  [facility] Pager [phone]. If 7PM-7AM, please contact night-coverage at [email or web address], password [facility] 08/20/2013, 10:14 AM  LOS: 3 days

## 2013-08-20 NOTE — Transfer of Care (Signed)
Immediate Anesthesia Transfer of Care Note  Patient: Ernest [name]  Procedure(s) Performed: Procedure(s): IRRIGATION AND DEBRIDEMENT WASHOUT ARM (Right) VAC ASSISTED CLOSURE FOREARM (Right)  Patient Location: PACU  Anesthesia Type:General  Level of Consciousness: awake, oriented, patient cooperative and responds to stimulation  Airway & Oxygen Therapy: Patient Spontanous Breathing and Patient connected to nasal cannula oxygen  Post-op Assessment: Report given to PACU RN, Post -op Vital signs reviewed and stable and Patient moving all extremities X 4  Post vital signs: Reviewed and stable  Complications: No apparent anesthesia complications

## 2013-08-20 NOTE — Anesthesia Postprocedure Evaluation (Signed)
Anesthesia Post Note  Patient: Ernest [name]  Procedure(s) Performed: Procedure(s) (LRB): IRRIGATION AND DEBRIDEMENT WASHOUT ARM (Right) VAC ASSISTED CLOSURE FOREARM (Right)  Anesthesia type: General  Patient location: PACU  Post pain: Pain level controlled and Adequate analgesia  Post assessment: Post-op Vital signs reviewed, Patient's Cardiovascular Status Stable, Respiratory Function Stable, Patent Airway and Pain level controlled  Last Vitals:  Filed Vitals:   08/20/13 2150  BP: 178/101  Pulse: 76  Temp:   Resp: 8    Post vital signs: Reviewed and stable  Level of consciousness: awake, alert  and oriented  Complications: No apparent anesthesia complications

## 2013-08-20 NOTE — Anesthesia Preprocedure Evaluation (Signed)
Anesthesia Evaluation  Patient identified by MRN, date of birth, ID band Patient awake    Reviewed: Allergy & Precautions, H&P , NPO status , Patient's Chart, lab work & pertinent test results  Airway Mallampati: II  Neck ROM: full    Dental   Pulmonary Current Smoker,          Cardiovascular hypertension,     Neuro/Psych    GI/Hepatic   Endo/Other  diabetes, Type 2  Renal/GU      Musculoskeletal   Abdominal   Peds  Hematology   Anesthesia Other Findings   Reproductive/Obstetrics                           Anesthesia Physical Anesthesia Plan  ASA: III  Anesthesia Plan: General   Post-op Pain Management:    Induction: Intravenous  Airway Management Planned: LMA  Additional Equipment:   Intra-op Plan:   Post-operative Plan:   Informed Consent: I have reviewed the patients History and Physical, chart, labs and discussed the procedure including the risks, benefits and alternatives for the proposed anesthesia with the patient or authorized representative who has indicated his/her understanding and acceptance.     Plan Discussed with: CRNA, Anesthesiologist and Surgeon  Anesthesia Plan Comments:         Anesthesia Quick Evaluation

## 2013-08-20 NOTE — Progress Notes (Signed)
Subjective: 2 Days Post-Op Procedure(s) (LRB): IRRIGATION AND DEBRIDEMENT EXTREMITY (Right) Patient reports pain as mild. He is tolerating a regular diet. He is voiding. He notes no locking popping or catching. He moves his fingers well. He still has pain in his arm which is not surprising.  The patient notes no new findings.  I reviewed his chart at length. His white blood cell count is reviewed.  I've reviewed medicines consult and notes.  He is growing out staph     Objective: Vital signs in last 24 hours: Temp:  [98.4 F (36.9 C)-99 F (37.2 C)] 98.7 F (37.1 C) (11/21 1300) Pulse Rate:  [76-84] 84 (11/21 1300) Resp:  [18] 18 (11/21 1300) BP: (160-162)/(84-95) 160/84 mmHg (11/21 1300) SpO2:  [98 %-100 %] 100 % (11/21 1300)  Intake/Output from previous day: 11/20 0701 - 11/21 0700 In: 2896.3 [P.O.:720; I.V.:1826.3; IV Piggyback:350] Out: 2300 [Urine:2300] Intake/Output this shift: Total I/O In: 240 [P.O.:240] Out: 250 [Urine:250]   Recent Labs  08/18/13 0125 08/19/13 0435 08/20/13 0709  HGB 13.8 12.2* 11.2*    Recent Labs  08/19/13 0435 08/20/13 0709  WBC 5.5 3.8*  RBC 3.54* 3.41*  HCT 33.2* 31.7*  PLT 78* 59*    Recent Labs  08/19/13 0435 08/20/13 0709  NA 129* 133*  K 3.1* 3.6  CL 90* 99  CO2 25 24  BUN 7 5*  CREATININE 0.87 0.77  GLUCOSE 56* 94  CALCIUM 8.1* 7.4*    Recent Labs  08/19/13 0435  INR 0.99   Physical exam: There is no evidence of obvious necrotizing fasciitis. He does however have a lot of cellulitis and erythema. This is not overly improved over last 24 hours. He still has swelling your edema and remained neurovascularly intact.  He is nontender along fashion a planes. He does not have evidence of necrotizing fasciitis. He does not have evidence of compartment syndrome. I feel that given all issues a second washout is necessary. He does not have a dramatic improvement at this juncture.  HEENT is within normal limits. Left  upper extremity is normal. Lower extremity examination is benign. He is not short of breath he has clear lung fields. Heart is regular rate. Neurologically intact ABD soft Neurovascular intact Sensation intact distally Compartment soft  Assessment/Plan: 2 Days Post-Op Procedure(s) (LRB): IRRIGATION AND DEBRIDEMENT EXTREMITY (Right) At this juncture given the slow progress I would recommend a second irrigation and debridement with possible vacuum-assisted closure device and other measures. I discussed with the patient all findings.  Given the recent culture results I would recommend conversion to Ancef and discontinue the vancomycin and Zosyn.  I discussed these issues with the patient at length.  He is agreeable to the second washout.  I discussed him the unpredictable nature of infections and our concerns and issues in regards to his upper extremity. I do not see any systemic sepsis signs but do want to keep a close eye on her patient.  We appreciate the help from pharmacy and medicine in regards to his upper extremity predicament and health   Karen Chafe 08/20/2013, 4:47 PM

## 2013-08-20 NOTE — Anesthesia Postprocedure Evaluation (Signed)
  Anesthesia Post-op Note  Patient: Jerimey Burridge  Procedure(s) Performed: Procedure(s): IRRIGATION AND DEBRIDEMENT EXTREMITY (Right)  Patient Location: Nursing Unit  Anesthesia Type:General  Level of Consciousness: awake, alert  and oriented  Airway and Oxygen Therapy: Patient Spontanous Breathing and Patient connected to nasal cannula oxygen  Post-op Pain: none  Post-op Assessment: Post-op Vital signs reviewed, Patient's Cardiovascular Status Stable, Respiratory Function Stable, Patent Airway, No signs of Nausea or vomiting and Pain level controlled  Post-op Vital Signs: Reviewed and stable  Complications: No apparent anesthesia complications

## 2013-08-20 NOTE — Op Note (Signed)
See Dictation # [id number] Durenda Pechacek Md

## 2013-08-20 NOTE — Progress Notes (Signed)
ANTIBIOTIC CONSULT NOTE - FOLLOW UP  Pharmacy Consult:  Vancomycin / Zosyn Indication:  Right arm abscess with necrosis  Allergies  Allergen Reactions  . Shellfish Allergy Anaphylaxis and Swelling    Patient Measurements: Height: 5\' 9"  (175.3 cm) Weight: 196 lb 6.4 oz (89.086 kg) IBW/kg (Calculated) : 70.7  Vital Signs: Temp: 98.4 F (36.9 C) (11/21 0543) Temp src: Oral (11/21 0543) BP: 161/87 mmHg (11/21 0543) Pulse Rate: 76 (11/21 0543) Intake/Output from previous day: 11/20 0701 - 11/21 0700 In: 2896.3 [P.O.:720; I.V.:1826.3; IV Piggyback:350] Out: 2300 [Urine:2300]  Labs:  Recent Labs  08/17/13 2159 08/18/13 0125 08/19/13 0435 08/20/13 0709  WBC  --  3.1* 5.5 3.8*  HGB  --  13.8 12.2* 11.2*  PLT  --  57* 78* 59*  CREATININE 0.80  --  0.87 0.77   Estimated Creatinine Clearance: 113.9 ml/min (by C-G formula based on Cr of 0.77). No results found for this basename: VANCOTROUGH, VANCOPEAK, VANCORANDOM, GENTTROUGH, GENTPEAK, GENTRANDOM, TOBRATROUGH, TOBRAPEAK, TOBRARND, AMIKACINPEAK, AMIKACINTROU, AMIKACIN,  in the last 72 hours   Microbiology: Recent Results (from the past 720 hour(s))  AFB CULTURE WITH SMEAR     Status: None   Collection Time    08/18/13  2:33 AM      Result Value Range Status   Specimen Description ABSCESS ARM RIGHT   Final   Special Requests PT ON VANCOMYCIN   Final   ACID FAST SMEAR     Final   Value: NO ACID FAST BACILLI SEEN     Performed at [facility]   Culture     Final   Value: CULTURE WILL BE EXAMINED FOR 6 WEEKS BEFORE ISSUING A FINAL REPORT     Performed at [facility]   Report Status PENDING   Incomplete  ANAEROBIC CULTURE     Status: None   Collection Time    08/18/13  2:33 AM      Result Value Range Status   Specimen Description ABSCESS ARM RIGHT   Final   Special Requests PT ON VANCOMYCIN   Final   Gram Stain     Final   Value: MODERATE WBC PRESENT, PREDOMINANTLY PMN     NO SQUAMOUS EPITHELIAL CELLS  SEEN     ABUNDANT GRAM POSITIVE COCCI IN PAIRS     Performed at [facility]   Culture     Final   Value: NO ANAEROBES ISOLATED; CULTURE IN PROGRESS FOR 5 DAYS     Performed at [facility]   Report Status PENDING   Incomplete  CULTURE, ROUTINE-ABSCESS     Status: None   Collection Time    08/18/13  2:33 AM      Result Value Range Status   Specimen Description ABSCESS ARM RIGHT   Final   Special Requests PT ON VANCOMYCIN   Final   Gram Stain     Final   Value: MODERATE WBC PRESENT, PREDOMINANTLY PMN     NO SQUAMOUS EPITHELIAL CELLS SEEN     ABUNDANT GRAM POSITIVE COCCI IN PAIRS     Performed at [facility]   Culture     Final   Value: MODERATE STAPHYLOCOCCUS AUREUS     Note: RIFAMPIN AND GENTAMICIN SHOULD NOT BE USED AS SINGLE DRUGS FOR TREATMENT OF STAPH INFECTIONS.     Performed at [facility]   Report Status 08/20/2013 FINAL   Final   Organism ID, Bacteria STAPHYLOCOCCUS AUREUS   Final  Assessment: 40 YOM admitted with swelling in his right forearm and started on vancomycin and Zosyn.  S/p I&D and found to have necrosis.  Patient's renal function has been stable.  Please see culture result below:  Vanc 11/18 >> Zosyn 11/18 >>  11/18 right arm abscess cx - Staph aureus (pan sensitive except PCN)   Goal of Therapy:  Vanc trough ~15 mcg/mL   Plan:  - Continue vanc 1gm IV Q8H - Zosyn 3.375gm IV Q8H, 4 hr infusion - Monitor renal fxn, clinical course - Recommend de-escalating abx to Ancef monotherapy, vanc trough tomorrow if therapy to continue - F/U resume home meds:  HCTZ, glipizide, metformin    Arelly Whittenberg D. Laney Potash, PharmD, BCPS Pager:  [phone] 08/20/2013, 12:43 PM

## 2013-08-20 NOTE — Clinical Documentation Improvement (Signed)
  Please indicate in Notes and DC summary the abnormal lab values being treated to illustrate this patient's severity of illness and risk of mortality. Thank you.  Possible Clinical Conditions? - hyponatremia - other condition (please specify)  Supporting Information: - Na+: 11/20:129,  11/21:133 - K+:  11/20:3.1 - 0.9% NaCl with KCL IV at 75cc/hr  Thank You, Beverley Fiedler ,RN Clinical Documentation Specialist:  [phone]  [facility]- Health Information Management

## 2013-08-21 LAB — COMPREHENSIVE METABOLIC PANEL
ALT: 41 U/L (ref 0–53)
AST: 126 U/L — ABNORMAL HIGH (ref 0–37)
Albumin: 2.1 g/dL — ABNORMAL LOW (ref 3.5–5.2)
Alkaline Phosphatase: 105 U/L (ref 39–117)
GFR calc Af Amer: >90 mL/min (ref >90)
GFR calc non Af Amer: >90 mL/min (ref >90)
Glucose, Bld: 177 mg/dL — ABNORMAL HIGH (ref 70–99)
Potassium: 3.9 mEq/L (ref 3.5–5.1)
Sodium: 130 mEq/L — ABNORMAL LOW (ref 135–145)
Total Bilirubin: 1.8 mg/dL — ABNORMAL HIGH (ref 0.3–1.2)
Total Protein: 6.7 g/dL (ref 6.0–8.3)

## 2013-08-21 LAB — CBC WITH DIFFERENTIAL/PLATELET
Basophils Relative: 0 % (ref 0–1)
Eosinophils Absolute: 0.1 10*3/uL (ref 0.0–0.7)
Eosinophils Relative: 3 % (ref 0–5)
HCT: 34.3 % — ABNORMAL LOW (ref 39.0–52.0)
Hemoglobin: 12 g/dL — ABNORMAL LOW (ref 13.0–17.0)
Lymphocytes Relative: 32 % (ref 12–46)
MCH: 33.2 pg (ref 26.0–34.0)
MCHC: 35 g/dL (ref 30.0–36.0)
Monocytes Absolute: 0.4 10*3/uL (ref 0.1–1.0)
Neutro Abs: 2 10*3/uL (ref 1.7–7.7)
Neutrophils Relative %: 53 % (ref 43–77)
RBC: 3.61 MIL/uL — ABNORMAL LOW (ref 4.22–5.81)
WBC: 3.7 10*3/uL — ABNORMAL LOW (ref 4.0–10.5)

## 2013-08-21 LAB — GLUCOSE, CAPILLARY
Glucose-Capillary: 164 mg/dL — ABNORMAL HIGH (ref 70–99)
Glucose-Capillary: 184 mg/dL — ABNORMAL HIGH (ref 70–99)

## 2013-08-21 LAB — APTT: aPTT: 27 seconds (ref 24–37)

## 2013-08-21 MED ORDER — AMLODIPINE BESYLATE 10 MG PO TABS: 10.0000 mg | ORAL_TABLET | Freq: Every day | ORAL | Status: DC

## 2013-08-21 MED ORDER — AMLODIPINE BESYLATE 10 MG PO TABS
10.0000 mg | ORAL_TABLET | Freq: Every day | ORAL | Status: DC
  Administered 2013-08-21 – 2013-08-25 (×5): 10 mg via ORAL
  Filled 2013-08-21 (×5): qty 1

## 2013-08-21 NOTE — Consult Note (Signed)
[facility] PROGRESS NOTE  Ernest [name] DGU:[id number] DOB: 1956/08/24 DOA: 08/17/2013 PCP: No PCP Per Patient  Assessment/Plan: 57 y/o male with PMH of IDDM, HTN, admitted with arm abscess  1. Abscess of right arm; s/p I&D x 2; wound vac; per orthopedics   2. DM uncontrolled; HA1C-7.1; likely elevated due to infection; episodes of hypoglycemia  -resume Oral meds upon discharge  -decreased lantus to 20 units BID upon d/c  3. HTN uncontrolled; on HCTZ, Lisinopril, Inderal; add kcl Po  -add amlodipine;   4. Liver cirrhosis; per patient he was Dx with cirrhosis in the past; ? Hepatitis vs etoh related  -Thrombocytopenia, LFTs elevated in 2011; US showed changes with cirrhosis;  -patient reports having gastroenterologist who f/u fo cirrhosis; recommended to cont close outpatient follow up; he wants to f/u outpatient with his GI physician    5. Hypo Na, hypo K likely due to diuretics;improved; added potassium   Thank you for consultation  Will sign off at this time; please call with questions; D/c plans per primary service   Code Status: full Family Communication: none at the bedside (indicate person spoken with, relationship, and if by phone, the number) Disposition Plan: per primary, when ready    Consultants:    Procedures:  I&D   Antibiotics:  Zosyn per primary  (indicate start date, and stop date if known)  HPI/Subjective: alert  Objective: Filed Vitals:   08/21/13 0548  BP: 169/99  Pulse: 73  Temp: 98.5 F (36.9 C)  Resp: 16    Intake/Output Summary (Last 24 hours) at 08/21/13 0930 Last data filed at 08/21/13 0551  Gross per 24 hour  Intake 2092.5 ml  Output   1750 ml  Net  342.5 ml   Filed Weights   08/17/13 2027 08/18/13 0441  Weight: 85.73 kg (189 lb) 89.086 kg (196 lb 6.4 oz)    Exam:   General:  Alert  Cardiovascular: s1,s2 rrr  Respiratory: CTA bl  Abdomen: soft, nt nd   Musculoskeletal: no LE edema    Data  Reviewed: Basic Metabolic Panel:  Recent Labs Lab 08/17/13 2159 08/19/13 0435 08/20/13 0709  NA 134* 129* 133*  K 4.0 3.1* 3.6  CL 94* 90* 99  CO2 27 25 24   GLUCOSE 195* 56* 94  BUN 8 7 5*  CREATININE 0.80 0.87 0.77  CALCIUM 8.5 8.1* 7.4*   Liver Function Tests:  Recent Labs Lab 08/19/13 0435 08/20/13 0709  AST 118* 122*  ALT 42 37  ALKPHOS 107 85  BILITOT 2.0* 1.7*  PROT 7.2 6.2  ALBUMIN 2.3* 1.9*   No results found for this basename: LIPASE, AMYLASE,  in the last 168 hours No results found for this basename: AMMONIA,  in the last 168 hours CBC:  Recent Labs Lab 08/18/13 0125 08/19/13 0435 08/20/13 0709  WBC 3.1* 5.5 3.8*  NEUTROABS 1.3* 2.7 1.5*  HGB 13.8 12.2* 11.2*  HCT 38.4* 33.2* 31.7*  MCV 92.5 93.8 93.0  PLT 57* 78* 59*   Cardiac Enzymes: No results found for this basename: CKTOTAL, CKMB, CKMBINDEX, TROPONINI,  in the last 168 hours BNP (last 3 results) No results found for this basename: PROBNP,  in the last 8760 hours CBG:  Recent Labs Lab 08/20/13 1647 08/20/13 1946 08/20/13 2152 08/20/13 2301 08/21/13 0742  GLUCAP 153* 120* 89 89 120*    Recent Results (from the past 240 hour(s))  AFB CULTURE WITH SMEAR     Status: None   Collection Time  08/18/13  2:33 AM      Result Value Range Status   Specimen Description ABSCESS ARM RIGHT   Final   Special Requests PT ON VANCOMYCIN   Final   ACID FAST SMEAR     Final   Value: NO ACID FAST BACILLI SEEN     Performed at [facility]   Culture     Final   Value: CULTURE WILL BE EXAMINED FOR 6 WEEKS BEFORE ISSUING A FINAL REPORT     Performed at [facility]   Report Status PENDING   Incomplete  ANAEROBIC CULTURE     Status: None   Collection Time    08/18/13  2:33 AM      Result Value Range Status   Specimen Description ABSCESS ARM RIGHT   Final   Special Requests PT ON VANCOMYCIN   Final   Gram Stain     Final   Value: MODERATE WBC PRESENT, PREDOMINANTLY PMN     NO  SQUAMOUS EPITHELIAL CELLS SEEN     ABUNDANT GRAM POSITIVE COCCI IN PAIRS     Performed at [facility]   Culture     Final   Value: NO ANAEROBES ISOLATED; CULTURE IN PROGRESS FOR 5 DAYS     Performed at [facility]   Report Status PENDING   Incomplete  CULTURE, ROUTINE-ABSCESS     Status: None   Collection Time    08/18/13  2:33 AM      Result Value Range Status   Specimen Description ABSCESS ARM RIGHT   Final   Special Requests PT ON VANCOMYCIN   Final   Gram Stain     Final   Value: MODERATE WBC PRESENT, PREDOMINANTLY PMN     NO SQUAMOUS EPITHELIAL CELLS SEEN     ABUNDANT GRAM POSITIVE COCCI IN PAIRS     Performed at [facility]   Culture     Final   Value: MODERATE STAPHYLOCOCCUS AUREUS     Note: RIFAMPIN AND GENTAMICIN SHOULD NOT BE USED AS SINGLE DRUGS FOR TREATMENT OF STAPH INFECTIONS.     Performed at [facility]   Report Status 08/20/2013 FINAL   Final   Organism ID, Bacteria STAPHYLOCOCCUS AUREUS   Final     Studies: US Abdomen Complete  08/19/2013   CLINICAL DATA:  Evaluate for cirrhosis  EXAM: ULTRASOUND ABDOMEN COMPLETE  COMPARISON:  None.  FINDINGS: Gallbladder  Stone within the lumen of the gallbladder measures 1.7 cm. No gallbladder wall thickening, pericholecystic fluid or positive sonographic Murphy's sign.  Common bile duct  Diameter: 3.3 mm  Liver  The liver has a nodular contour. The echotexture of the liver is somewhat coarsened. No focal mass lesion identified.  IVC  No abnormality visualized.  Pancreas  Visualized portion unremarkable.  Spleen  Size and appearance within normal limits.  Right Kidney  Length: 13.3 cm. Echogenicity within normal limits. No mass or hydronephrosis visualized.  Left Kidney  Length: 12.8 cm. Echogenicity within normal limits. No mass or hydronephrosis visualized.  Abdominal aorta  No aneurysm visualized.  IMPRESSION: 1. No acute findings. 2. Morphologic features of the liver suggestive of cirrhosis. 3.  Gallstone.   Electronically Signed   By: Signa Kell M.D.   On: 08/19/2013 16:49    Scheduled Meds: . amLODipine  5 mg Oral Daily  .  ceFAZolin (ANCEF) IV  1 g Intravenous Q8H  . docusate sodium  100 mg Oral BID  . folic acid  1 mg Oral Daily  . hydrocerin   Topical BID  . insulin aspart  0-15 Units Subcutaneous TID WC  . insulin glargine  20 Units Subcutaneous BID  . lisinopril  40 mg Oral Daily  . multivitamin with minerals  1 tablet Oral Daily  . pantoprazole  40 mg Oral Daily  . potassium chloride  20 mEq Oral BID  . propranolol  40 mg Oral BID  . thiamine  100 mg Oral Daily  . vitamin C  1,000 mg Oral Daily   Continuous Infusions: . 0.9 % NaCl with KCl 20 mEq / L 75 mL/hr at 08/20/13 2306    Principal Problem:   Abscess of right arm Active Problems:   HTN (hypertension)   Type II or unspecified type diabetes mellitus with unspecified complication, uncontrolled   Thrombocytopenia, unspecified    Time spent: >35 minutes     Esperanza Sheets  [facility] Pager [phone]. If 7PM-7AM, please contact night-coverage at [email or web address], password [facility] 08/21/2013, 9:30 AM  LOS: 4 days

## 2013-08-21 NOTE — Progress Notes (Signed)
Patient ID: Tri Chittick, male   DOB: 06/16/1956, 57 y.o.   MRN: [id number] Patient is seen at bedside. He is awake alert and oriented. He states he feels better.  He is tolerating a regular diet. He is neurovascular intact in the right upper extremity. His vacuum-assisted closure device is in good working fashion.  I reviewed his labs and other parameters. At present time we will continue aggressive approach to his infection. He is afebrile he looks improved and his wound at the time surgery was fairly stable but had hematoma and cellulitic features.  We will plan to taken to surgical arena tomorrow and likely close the area if necessary. I discussed this issue with him and he is comfortable with the plan of care at this juncture.      Marland Kitchen.We have discussed with the patient the issues regarding their infection to the extremity. We will continue antibiotics and await culture results. Often times it will take 3-5 days for cultures to become final. During this time we will typically have patients on intravenous antibiotics until we can find a parenteral route of antibiotic regime specific for the bacteria or organism isolated. We have discussed with the patient the need for daily irrigation and debridement as well as therapy to the area. We have discussed with the patient the necessity of range of motion to the involved joints as discussed today. We have discussed with the patient the unpredictability of infections at times. We'll continue to work towards good pain control and restoration of function. The patient understands the need for meticulous wound care and the necessity of proper followup.  The possible complications of stiffness (loss of motion), resistant infection, possible deep bone infection, possible chronic pain issues, possible need for multiple surgeries and even amputation.  With this in mind the patient understands our goal is to eradicate the infection to quiesence. We will continue to  work towards these goals.   Dominica Severin M.D.

## 2013-08-22 ENCOUNTER — Inpatient Hospital Stay (HOSPITAL_COMMUNITY): Payer: Medicare Other | Admitting: Anesthesiology

## 2013-08-22 ENCOUNTER — Encounter (HOSPITAL_COMMUNITY)
Admission: EM | Disposition: A | Discharge status: Home / Self Care | Payer: Self-pay | Source: Home / Self Care | Attending: Orthopedic Surgery

## 2013-08-22 ENCOUNTER — Encounter (HOSPITAL_COMMUNITY): Payer: Medicare Other | Admitting: Anesthesiology

## 2013-08-22 HISTORY — PX: I & D EXTREMITY: SHX5045

## 2013-08-22 LAB — GLUCOSE, CAPILLARY
Glucose-Capillary: 123 mg/dL — ABNORMAL HIGH (ref 70–99)
Glucose-Capillary: 94 mg/dL (ref 70–99)
Glucose-Capillary: 113 mg/dL — ABNORMAL HIGH (ref 70–99)

## 2013-08-22 LAB — CBC WITH DIFFERENTIAL/PLATELET
Eosinophils Relative: 2 % (ref 0–5)
HCT: 32.5 % — ABNORMAL LOW (ref 39.0–52.0)
Hemoglobin: 11.4 g/dL — ABNORMAL LOW (ref 13.0–17.0)
Lymphocytes Relative: 33 % (ref 12–46)
Lymphs Abs: 1.7 10*3/uL (ref 0.7–4.0)
MCV: 94.2 fL (ref 78.0–100.0)
Monocytes Absolute: 0.6 10*3/uL (ref 0.1–1.0)
Monocytes Relative: 11 % (ref 3–12)
Neutro Abs: 2.8 10*3/uL (ref 1.7–7.7)
Neutrophils Relative %: 54 % (ref 43–77)
Platelets: 69 10*3/uL — ABNORMAL LOW (ref 150–400)
RBC: 3.45 MIL/uL — ABNORMAL LOW (ref 4.22–5.81)
WBC: 5.1 10*3/uL (ref 4.0–10.5)

## 2013-08-22 SURGERY — IRRIGATION AND DEBRIDEMENT EXTREMITY
Anesthesia: General | Site: Arm Lower | Laterality: Right | Wound class: Clean Contaminated

## 2013-08-22 MED ORDER — PROPOFOL 10 MG/ML IV BOLUS
INTRAVENOUS | Status: DC | PRN
  Administered 2013-08-22: 200 mg via INTRAVENOUS

## 2013-08-22 MED ORDER — OXYCODONE HCL 5 MG/5ML PO SOLN: 5.0000 mg | Freq: Once | ORAL | Status: DC | PRN

## 2013-08-22 MED ORDER — ONDANSETRON HCL 4 MG/2ML IJ SOLN: 4.0000 mg | Freq: Four times a day (QID) | INTRAMUSCULAR | Status: DC | PRN

## 2013-08-22 MED ORDER — MIDAZOLAM HCL 5 MG/5ML IJ SOLN
INTRAMUSCULAR | Status: DC | PRN
  Administered 2013-08-22: 2 mg via INTRAVENOUS

## 2013-08-22 MED ORDER — LIDOCAINE HCL (CARDIAC) 20 MG/ML IV SOLN
INTRAVENOUS | Status: DC | PRN
  Administered 2013-08-22: 80 mg via INTRAVENOUS

## 2013-08-22 MED ORDER — SODIUM CHLORIDE 0.9 % IV SOLN
INTRAVENOUS | Status: DC | PRN
  Administered 2013-08-22: 12:00:00 via INTRAVENOUS

## 2013-08-22 MED ORDER — WHITE PETROLATUM GEL
Status: AC
  Administered 2013-08-22: 0.2
  Filled 2013-08-22: qty 5

## 2013-08-22 MED ORDER — SODIUM CHLORIDE 0.9 % IR SOLN
Status: DC | PRN
  Administered 2013-08-22: 4000 mL

## 2013-08-22 MED ORDER — HYDROMORPHONE HCL PF 1 MG/ML IJ SOLN: 0.2500 mg | INTRAMUSCULAR | Status: DC | PRN

## 2013-08-22 MED ORDER — CEFAZOLIN SODIUM-DEXTROSE 2-3 GM-% IV SOLR
INTRAVENOUS | Status: DC | PRN
  Administered 2013-08-22: 2 g via INTRAVENOUS

## 2013-08-22 MED ORDER — OXYCODONE HCL 5 MG PO TABS: 5.0000 mg | ORAL_TABLET | Freq: Once | ORAL | Status: DC | PRN

## 2013-08-22 MED ORDER — FENTANYL CITRATE 0.05 MG/ML IJ SOLN
INTRAMUSCULAR | Status: DC | PRN
  Administered 2013-08-22: 100 ug via INTRAVENOUS

## 2013-08-22 MED ORDER — CEFAZOLIN SODIUM-DEXTROSE 2-3 GM-% IV SOLR
2.0000 g | Freq: Once | INTRAVENOUS | Status: DC
  Filled 2013-08-22: qty 50

## 2013-08-22 SURGICAL SUPPLY — 47 items
BANDAGE CONFORM 2  STR LF (GAUZE/BANDAGES/DRESSINGS) IMPLANT
BANDAGE ELASTIC 4 VELCRO ST LF (GAUZE/BANDAGES/DRESSINGS) ×2 IMPLANT
BANDAGE GAUZE ELAST BULKY 4 IN (GAUZE/BANDAGES/DRESSINGS) ×3 IMPLANT
BNDG CMPR 9X4 STRL LF SNTH (GAUZE/BANDAGES/DRESSINGS) ×1
BNDG ESMARK 4X9 LF (GAUZE/BANDAGES/DRESSINGS) ×1 IMPLANT
CLOTH BEACON ORANGE TIMEOUT ST (SAFETY) ×1 IMPLANT
CORDS BIPOLAR (ELECTRODE) ×2 IMPLANT
CUFF TOURNIQUET SINGLE 18IN (TOURNIQUET CUFF) ×1 IMPLANT
CUFF TOURNIQUET SINGLE 24IN (TOURNIQUET CUFF) IMPLANT
DRAIN PENROSE 1/4X12 LTX STRL (WOUND CARE) ×1 IMPLANT
DRSG ADAPTIC 3X8 NADH LF (GAUZE/BANDAGES/DRESSINGS) ×1 IMPLANT
ELECT REM PT RETURN 9FT ADLT (ELECTROSURGICAL)
ELECTRODE REM PT RTRN 9FT ADLT (ELECTROSURGICAL) IMPLANT
GAUZE XEROFORM 1X8 LF (GAUZE/BANDAGES/DRESSINGS) ×1 IMPLANT
GAUZE XEROFORM 5X9 LF (GAUZE/BANDAGES/DRESSINGS) ×4 IMPLANT
GLOVE BIOGEL M STRL SZ7.5 (GLOVE) ×2 IMPLANT
GLOVE SS BIOGEL STRL SZ 8 (GLOVE) ×1 IMPLANT
GLOVE SUPERSENSE BIOGEL SZ 8 (GLOVE) ×1
GOWN PREVENTION PLUS XLARGE (GOWN DISPOSABLE) ×2 IMPLANT
GOWN STRL NON-REIN LRG LVL3 (GOWN DISPOSABLE) ×4 IMPLANT
GOWN STRL REIN XL XLG (GOWN DISPOSABLE) ×2 IMPLANT
HANDPIECE INTERPULSE COAX TIP (DISPOSABLE)
KIT BASIN OR (CUSTOM PROCEDURE TRAY) ×2 IMPLANT
KIT ROOM TURNOVER OR (KITS) ×2 IMPLANT
MANIFOLD NEPTUNE II (INSTRUMENTS) ×2 IMPLANT
NDL HYPO 25GX1X1/2 BEV (NEEDLE) IMPLANT
NEEDLE HYPO 25GX1X1/2 BEV (NEEDLE) IMPLANT
NS IRRIG 1000ML POUR BTL (IV SOLUTION) ×1 IMPLANT
PACK ORTHO EXTREMITY (CUSTOM PROCEDURE TRAY) ×2 IMPLANT
PAD ARMBOARD 7.5X6 YLW CONV (MISCELLANEOUS) ×4 IMPLANT
PAD CAST 4YDX4 CTTN HI CHSV (CAST SUPPLIES) ×1 IMPLANT
PADDING CAST ABS 4INX4YD NS (CAST SUPPLIES) ×2
PADDING CAST ABS COTTON 4X4 ST (CAST SUPPLIES) IMPLANT
PADDING CAST COTTON 4X4 STRL (CAST SUPPLIES)
SET HNDPC FAN SPRY TIP SCT (DISPOSABLE) IMPLANT
SET IRRIG Y TYPE TUR BLADDER L (SET/KITS/TRAYS/PACK) ×1 IMPLANT
SPLINT FIBERGLASS 4X30 (CAST SUPPLIES) ×1 IMPLANT
SPONGE GAUZE 4X4 12PLY (GAUZE/BANDAGES/DRESSINGS) ×2 IMPLANT
SPONGE LAP 18X18 X RAY DECT (DISPOSABLE) ×1 IMPLANT
SPONGE LAP 4X18 X RAY DECT (DISPOSABLE) ×1 IMPLANT
SYR CONTROL 10ML LL (SYRINGE) IMPLANT
TOWEL OR 17X24 6PK STRL BLUE (TOWEL DISPOSABLE) ×2 IMPLANT
TOWEL OR 17X26 10 PK STRL BLUE (TOWEL DISPOSABLE) ×2 IMPLANT
TUBE ANAEROBIC SPECIMEN COL (MISCELLANEOUS) IMPLANT
TUBE CONNECTING 12X1/4 (SUCTIONS) ×2 IMPLANT
WATER STERILE IRR 1000ML POUR (IV SOLUTION) ×1 IMPLANT
YANKAUER SUCT BULB TIP NO VENT (SUCTIONS) ×2 IMPLANT

## 2013-08-22 NOTE — Anesthesia Preprocedure Evaluation (Addendum)
Anesthesia Evaluation  Patient identified by MRN, date of birth, ID band Patient awake    Reviewed: Allergy & Precautions, H&P , NPO status , Patient's Chart, lab work & pertinent test results, reviewed documented beta blocker date and time   Airway Mallampati: II  Neck ROM: full    Dental  (+) Dental Advisory Given and Teeth Intact   Pulmonary Current Smoker,          Cardiovascular hypertension, Pt. on home beta blockers and Pt. on medications     Neuro/Psych    GI/Hepatic (+) Cirrhosis -       ,   Endo/Other  diabetes, Poorly Controlled, Type 2  Renal/GU      Musculoskeletal   Abdominal   Peds  Hematology   Anesthesia Other Findings   Reproductive/Obstetrics                        Anesthesia Physical Anesthesia Plan  ASA: III  Anesthesia Plan: General   Post-op Pain Management:    Induction: Intravenous  Airway Management Planned: LMA  Additional Equipment:   Intra-op Plan:   Post-operative Plan:   Informed Consent: I have reviewed the patients History and Physical, chart, labs and discussed the procedure including the risks, benefits and alternatives for the proposed anesthesia with the patient or authorized representative who has indicated his/her understanding and acceptance.     Plan Discussed with: CRNA, Anesthesiologist and Surgeon  Anesthesia Plan Comments:         Anesthesia Quick Evaluation

## 2013-08-22 NOTE — Anesthesia Procedure Notes (Signed)
Procedure Name: LMA Insertion Date/Time: 08/22/2013 11:45 AM Performed by: Alanda Amass A Pre-anesthesia Checklist: Patient identified, Timeout performed, Emergency Drugs available, Suction available and Patient being monitored Patient Re-evaluated:Patient Re-evaluated prior to inductionOxygen Delivery Method: Circle system utilized Preoxygenation: Pre-oxygenation with 100% oxygen Intubation Type: IV induction Ventilation: Mask ventilation without difficulty LMA Size: 5.0 Number of attempts: 1 Placement Confirmation: positive ETCO2 and breath sounds checked- equal and bilateral Tube secured with: Tape Dental Injury: Teeth and Oropharynx as per pre-operative assessment

## 2013-08-22 NOTE — Transfer of Care (Signed)
Immediate Anesthesia Transfer of Care Note  Patient: Ernest [name]  Procedure(s) Performed: Procedure(s): IRRIGATION AND DEBRIDEMENT FOREARM COMPLEX WITH WOUND CLOSURE (Right)  Patient Location: PACU  Anesthesia Type:General  Level of Consciousness: awake  Airway & Oxygen Therapy: Patient Spontanous Breathing  Post-op Assessment: Report given to PACU RN and Post -op Vital signs reviewed and stable  Post vital signs: Reviewed and stable  Complications: No apparent anesthesia complications

## 2013-08-22 NOTE — Op Note (Signed)
NAME:  REFOEL, PALLADINO NO.:  [id number]  MEDICAL RECORD NO.:  [id number]  LOCATION:  6N06C                        FACILITY:  [facility]  PHYSICIAN:  Dionne Ano. Kamori Barbier, M.D.DATE OF BIRTH:  Aug 23, 1956  DATE OF PROCEDURE: DATE OF DISCHARGE:                              OPERATIVE REPORT   PREOPERATIVE DIAGNOSIS:  Abscess of right forearm.  POSTOPERATIVE DIAGNOSIS:  Abscess of right forearm.  PROCEDURE: 1. Incision and drainage of skin, subcutaneous tissue, muscle as well     as an excisional debridement utilizing scissors, scalpel, and     curette. 2. Fasciotomy of right dorsal forearm. 3. Extensive tenolysis, tenosynovectomy, right dorsal forearm. 4. Vacuum-assisted closure device placement, right forearm.  SURGEON:  Dionne Ano. Amanda Pea, M.D.  ASSISTANT:  None.  COMPLICATIONS:  None.  ANESTHESIA:  General.  TOURNIQUET TIME:  0.  INDICATIONS:  This patient is a 57 year old male with diabetes and a pyomyositis.  He developed pus in his muscle without antecedent trauma __________.  He presents for second washout.  I have counseled him regard his risks and benefits, and he desires to proceed.  He is growing out Staphylococcus aureus sensitive to Ancef and thus he is tailored in terms of his antibiotic therapy to this.  OPERATIVE PROCEDURE:  The patient was seen by myself and Anesthesia, consented, taken to the procedure suite, underwent smooth induction of general anesthesia, laid supine, padded.  Prepped and draped in a sterile fashion with Hibiclens scrub.  Once this done, time-out was called.  Pre and postop check was completed.  Body parts padded.  The patient underwent a very careful and cautious approach to the wound. The wound was opened up.  I evacuated the large amount of hematoma which was unusual.  He accumulated a lot of hematoma and this was removed aggressively.  There was no reaccumulation of purulence.  There was no thick white fluid or foul  smelling material but muscle was healthy.  He did not have necrosis of the muscle.  I did perform debridement of skin, subcutaneous tissue, and muscle.  Any nonviable tissue was removed, any bloody clot was removed.  At this time, I then performed a fasciotomy of the dorsal forearm __________ fasciotomy of the right forearm. Following this, I then performed very careful and cautious irrigation of skin and subcutaneous tissue, and muscle with 4 L of saline.  Following this, tenolysis tenosynovectomy of the dorsal forearm was accomplished revealing adequate tissue perfusion in my opinion.  All compartments were soft at this juncture and I then placed a vacuum assisted closure device in my standard technique with Ioban placement on the skin followed by VAC and Ioban over the VAC.  The patient tolerated this well.  The VAC hooked up to suction nicely and no complicating features. He was woken after dressing was placed in the form of long-arm splint with ample padding and taken to recovery room.  We will continue closed observation and __________ to the staff and asked him to continue elevation, range of motion of finger, massage.  I will consider __________ Sunday, pending his progress.  These notes have been discussed and all questions have been encouraged and answered.  Dionne Ano. Amanda Pea, M.D.     [facility]  D:  08/20/2013  T:  08/21/2013  Job:  [id number]

## 2013-08-22 NOTE — Preoperative (Signed)
Beta Blockers   Reason not to administer Beta Blockers:received inderal at 0900 11/23

## 2013-08-22 NOTE — H&P (Signed)
  Patient presents for repeat washout right upper extremity. I views laboratory evaluation. He is stable. He certainly has thrombocytopenia as well as a low hematocrit. After the hematocrit is some degree of acute blood loss anemia.  He is ready for a third wash out and possible closure depending on wound conditions.  Cultures are final and he is being treated for the Staph A  All questions have been encouraged and answered  W Uri Turnbough M.D.

## 2013-08-22 NOTE — Op Note (Signed)
NAME:  Ernest [name], Ernest [name] NO.:  [id number]  MEDICAL RECORD NO.:  [id number]  LOCATION:  6N15C                        FACILITY:  [facility]  PHYSICIAN:  Dionne Ano. 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  PROCEDURE: 1. Irrigation and debridement of skin, subcutaneous tissue, muscle.     This was an excisional debridement with knife, curette, and     scissor. 2. Complex wound closure over drain, right forearm.  SURGEON:  Dionne Ano. Amanda Pea, MD  ASSISTANT:  None.  COMPLICATIONS:  None.  ANESTHESIA:  General.  TOURNIQUET TIME:  0.  INDICATIONS:  The patient is a 57 year old male with thrombocytopenia, diabetes, hypertension, and an abscess in his right forearm, this is third irrigation and debridement.  He has had a vacuum-assisted closure device on the arm.  Hopefully today, wound conditions will be satisfactory to perform loose closure.  OPERATION DETAIL:  The patient was seen by myself and Anesthesia, underwent smooth induction of general anesthetic.  Time-out was called and prepped and draped in usual sterile fashion.  Body parts well padded.  Once this was done, he underwent sterile prep and drape with Hibiclens scrub.  Following this, sterile field was secured.  Final time- out called and pre and postop check list complete.  Once this was done, I then performed irrigation and debridement of skin, subcutaneous tissue, muscle, and any nonviable tissue.  Overall the wound condition looked excellent.  There was minimal bleeding.  I irrigated with 3 L of saline with cysto tubing.  Following this, I placed a Penrose drain on the wound and closed this with far-near-near-far  sutures of 3-0 Prolene variety loosely.  He tolerated this well.  Sterile dressing was applied as well as long-arm splint.  The patient tolerated this well.  He was taken to the recovery room and will be continued on antibiotics, advance his diet, and notify should any problems occur.  Hopefully, he will continue to resolve.  His labs have remained relatively stable.  He feels much better.     Dionne Ano. Amanda Pea, M.D.     [facility]  D:  08/22/2013  T:  08/22/2013  Job:  [id number]

## 2013-08-22 NOTE — Progress Notes (Signed)
Spoke with pts wife to give update

## 2013-08-22 NOTE — Anesthesia Postprocedure Evaluation (Signed)
Anesthesia Post Note  Patient: Ernest [name]  Procedure(s) Performed: Procedure(s) (LRB): IRRIGATION AND DEBRIDEMENT FOREARM COMPLEX WITH WOUND CLOSURE (Right)  Anesthesia type: General  Patient location: PACU  Post pain: Pain level controlled and Adequate analgesia  Post assessment: Post-op Vital signs reviewed, Patient's Cardiovascular Status Stable, Respiratory Function Stable, Patent Airway and Pain level controlled  Last Vitals:  Filed Vitals:   08/22/13 1245  BP: 159/101  Pulse: 73  Temp:   Resp: 23    Post vital signs: Reviewed and stable  Level of consciousness: awake, alert  and oriented  Complications: No apparent anesthesia complications

## 2013-08-22 NOTE — Op Note (Signed)
See Dictation #[id number] Amanda Pea MD

## 2013-08-23 LAB — GLUCOSE, CAPILLARY
Glucose-Capillary: 225 mg/dL — ABNORMAL HIGH (ref 70–99)
Glucose-Capillary: 151 mg/dL — ABNORMAL HIGH (ref 70–99)
Glucose-Capillary: 186 mg/dL — ABNORMAL HIGH (ref 70–99)

## 2013-08-23 LAB — ANAEROBIC CULTURE

## 2013-08-23 NOTE — Progress Notes (Signed)
Subjective: 1 Day Post-Op Procedure(s) (LRB): IRRIGATION AND DEBRIDEMENT FOREARM COMPLEX WITH WOUND CLOSURE (Right) Patient reports pain as controlled. He denies any nausea, vomiting, shortness of breath he is tolerating a regular diet without difficulties at this juncture. He is voiding without difficulties.   Objective: Vital signs in last 24 hours: Temp:  [97.8 F (36.6 C)-98.9 F (37.2 C)] 98.9 F (37.2 C) (11/24 0516) Pulse Rate:  [68-81] 81 (11/24 0516) Resp:  [11-23] 18 (11/24 0516) BP: (144-178)/(81-107) 155/88 mmHg (11/24 0516) SpO2:  [97 %-100 %] 100 % (11/24 0516)  Intake/Output from previous day: 11/23 0701 - 11/24 0700 In: 5571.8 [I.V.:5321.8; IV Piggyback:250] Out: 2126 [Urine:2100; Stool:1; Blood:25] Intake/Output this shift:     Recent Labs  08/21/13 1015 08/22/13 0720  HGB 12.0* 11.4*    Recent Labs  08/21/13 1015 08/22/13 0720  WBC 3.7* 5.1  RBC 3.61* 3.45*  HCT 34.3* 32.5*  PLT 68* 69*    Recent Labs  08/21/13 1015  NA 130*  K 3.9  CL 98  CO2 21  BUN 5*  CREATININE 0.74  GLUCOSE 177*  CALCIUM 7.8*    Recent Labs  08/21/13 1015  INR 1.10       Results   Culture, routine-abscess (Order [id number])       Culture, routine-abscess  Status: Final result     Visible to patient: This result is not viewable by the patient.     Next appt: None              5d ago   Specimen Description ABSCESS ARM RIGHT     Special Requests PT ON VANCOMYCIN     Gram Stain MODERATE WBC PRESENT, PREDOMINANTLY PMN NO SQUAMOUS EPITHELIAL CELLS SEEN ABUNDANT GRAM POSITIVE COCCI IN PAIRS Performed at [facility]    Culture MODERATE STAPHYLOCOCCUS AUREUS Note: RIFAMPIN AND GENTAMICIN SHOULD NOT BE USED AS SINGLE DRUGS FOR TREATMENT OF STAPH INFECTIONS. Performed at [facility]    Report Status 08/20/2013 FINAL     Organism ID, Bacteria STAPHYLOCOCCUS AUREUS     Resulting Agency [facility]      Culture & Susceptibility       Antibiotic   Organism Organism Organism        STAPHYLOCOCCUS AUREUS        CLINDAMYCIN    <=0.25 SENSITIVE   S Final            ERYTHROMYCIN    <=0.25 SENSITIVE   S Final            GENTAMICIN    <=0.5 SENSITIVE   S Final            LEVOFLOXACIN    <=0.12 SENSITIVE   S Final            MOXIFLOXACIN    <=0.25 SENSITIVE   S Final            OXACILLIN    0.5 SENSITIVE   S Final            PENICILLIN    >=0.5 RESISTANT   R Final            RIFAMPIN    <=0.5 SENSITIVE   S Final            TETRACYCLINE    <=1 SENSITIVE   S Final            TRIMETH/SULFA    <=10 SENSITIVE   S Final  VANCOMYCIN    1 SENSITIVE   S Final                            Comments        Organism: STAPHYLOCOCCUS AUREUS Antibiotic: CLINDAMYCIN    Method: MIC           Organism: STAPHYLOCOCCUS AUREUS Antibiotic: ERYTHROMYCIN    Method: MIC           Organism: STAPHYLOCOCCUS AUREUS Antibiotic: GENTAMICIN    Method: MIC           Organism: STAPHYLOCOCCUS AUREUS Antibiotic: LEVOFLOXACIN    Method: MIC           Organism: STAPHYLOCOCCUS AUREUS Antibiotic: MOXIFLOXACIN    Method: MIC           Organism: STAPHYLOCOCCUS AUREUS Antibiotic: OXACILLIN    Method: MIC           Organism: STAPHYLOCOCCUS AUREUS Antibiotic: PENICILLIN    Method: MIC           Organism: STAPHYLOCOCCUS AUREUS Antibiotic: RIFAMPIN    Method: MIC           Organism: STAPHYLOCOCCUS AUREUS Antibiotic: TETRACYCLINE    Method: MIC           Organism: STAPHYLOCOCCUS AUREUS Antibiotic: TRIMETH/SULFA    Method: MIC           Organism: STAPHYLOCOCCUS AUREUS Antibiotic: VANCOMYCIN    Method: MIC           STAPHYLOCOCCUS AUREUS (MIC):    MODERATE STAPHYLOCOCCUS AUREUS       Specimen Collected: 08/18/13  2:33 AM Last Resulted: 08/20/13  8:25 AM                            Encounter    View Encounter      Result Information    Status    Final result (08/20/2013  8:25 AM)    Provider Status: Ordered         Lab Information    [facility]                   Order-Level Documents:    There are no order-level documents.         Culture, routine-abscess (Order [id number])  Microbiology  Order: [id number]   Released By: Auto-released  Authorizing: Dominica Severin, MD   Date: 08/18/2013  Department: [facility]  SURGICAL        Order Information    Order Date/Time Release Date/Time Start Date/Time End Date/Time    08/18/13 02:33 AM 08/18/13 02:33 AM 08/18/13 02:33 AM 08/18/13 02:33 AM        Order Details    Frequency Duration Priority Order Class    Once 1  occurrence Routine Normal        Acc#    [id number]        Order History Inpatient    Date/Time Action Taken User Additional Information    08/18/13 0233 Release   From Order: [id number]    08/18/13 0524 Result Lab In [facility] Interface In process    08/18/13 1327 Result Lab In [facility] Preliminary    08/19/13 0825 Result Lab In [facility] Preliminary    08/20/13 0825 Result Lab In [facility] Final      Order Requisition    Culture, routine-abscess (Order #[id number]) on 08/18/13  Collection Information    Collected: 08/18/2013  2:33 AM   Resulting Agency: [facility]         Additional Information    Associated Reports    View Encounter    Priority and Order Details    Collection Information.      View [facility], routine-abscess (Order #[id number]) on 08/18/13      .Marland KitchenThe patient is alert and oriented in no acute distress the patient complains of pain in the affected upper extremity.  The patient is noted to have a normal HEENT exam.  Lung fields show equal chest expansion and no shortness of breath  abdomen exam is nontender without distention.  Lower extremity examination does not show any fracture dislocation or blood clot symptoms.  Pelvis is stable neck and back are stable and nontender Evaluation of the upper  extremity shows his dressings are clean and intact. He has excellent digital range of motion no signs of cellulitis  Assessment/Plan: 1 Day Post-Op Procedure(s) (LRB): IRRIGATION AND DEBRIDEMENT FOREARM COMPLEX WITH WOUND CLOSURE (Right) Continued IV antibiotics today, we'll repeat CBC and see him at [facility]  In the am We'll plan to change his dressings tomorrow, and check his wound if all looks well tomorrow we will consider discharging him home. Pietra Zuluaga L 08/23/2013, 12:00 PM

## 2013-08-23 NOTE — Anesthesia Postprocedure Evaluation (Signed)
Anesthesia Post Note  Patient: Ernest [name]  Procedure(s) Performed: Procedure(s) (LRB): IRRIGATION AND DEBRIDEMENT EXTREMITY (Right)  Anesthesia type: General  Patient location: PACU  Post pain: Pain level controlled  Post assessment: Patient's Cardiovascular Status Stable  Last Vitals:  Filed Vitals:   08/23/13 0516  BP: 155/88  Pulse: 81  Temp: 37.2 C  Resp: 18    Post vital signs: Reviewed and stable  Level of consciousness: alert  Complications: No apparent anesthesia complications

## 2013-08-24 ENCOUNTER — Encounter (HOSPITAL_COMMUNITY): Payer: Self-pay | Admitting: Orthopedic Surgery

## 2013-08-24 LAB — GLUCOSE, CAPILLARY
Glucose-Capillary: 117 mg/dL — ABNORMAL HIGH (ref 70–99)
Glucose-Capillary: 113 mg/dL — ABNORMAL HIGH (ref 70–99)
Glucose-Capillary: 162 mg/dL — ABNORMAL HIGH (ref 70–99)

## 2013-08-24 LAB — CBC WITH DIFFERENTIAL/PLATELET
Basophils Absolute: 0 10*3/uL (ref 0.0–0.1)
Basophils Relative: 0 % (ref 0–1)
Eosinophils Absolute: 0.1 10*3/uL (ref 0.0–0.7)
Eosinophils Relative: 2 % (ref 0–5)
HCT: 31.4 % — ABNORMAL LOW (ref 39.0–52.0)
Lymphocytes Relative: 32 % (ref 12–46)
MCH: 33.5 pg (ref 26.0–34.0)
MCHC: 35.4 g/dL (ref 30.0–36.0)
MCV: 94.9 fL (ref 78.0–100.0)
Monocytes Absolute: 0.5 10*3/uL (ref 0.1–1.0)
Neutrophils Relative %: 56 % (ref 43–77)
RDW: 14.4 % (ref 11.5–15.5)

## 2013-08-24 LAB — COMPREHENSIVE METABOLIC PANEL
AST: 86 U/L — ABNORMAL HIGH (ref 0–37)
Albumin: 2 g/dL — ABNORMAL LOW (ref 3.5–5.2)
Calcium: 7.3 mg/dL — ABNORMAL LOW (ref 8.4–10.5)
Creatinine, Ser: 0.7 mg/dL (ref 0.50–1.35)
Glucose, Bld: 106 mg/dL — ABNORMAL HIGH (ref 70–99)
Sodium: 135 mEq/L (ref 135–145)

## 2013-08-24 MED ORDER — DOXYCYCLINE HYCLATE 50 MG PO CAPS: 50.0000 mg | ORAL_CAPSULE | Freq: Two times a day (BID) | ORAL | Status: DC

## 2013-08-24 MED ORDER — OXYCODONE HCL 5 MG PO TABS: 5.0000 mg | ORAL_TABLET | ORAL | Status: DC | PRN

## 2013-08-24 NOTE — Progress Notes (Signed)
Subjective: 2 Days Post-Op Procedure(s) (LRB): IRRIGATION AND DEBRIDEMENT FOREARM COMPLEX WITH WOUND CLOSURE (Right) Patient reports pain as mild.    Objective: Vital signs in last 24 hours: Temp:  [98.2 F (36.8 C)-98.5 F (36.9 C)] 98.2 F (36.8 C) (11/25 1449) Pulse Rate:  [73-82] 78 (11/25 1449) Resp:  [16-18] 16 (11/25 1449) BP: (136-151)/(80-81) 151/80 mmHg (11/25 1449) SpO2:  [100 %] 100 % (11/25 1449)  Intake/Output from previous day: 11/24 0701 - 11/25 0700 In: 1368.8 [P.O.:600; I.V.:618.8; IV Piggyback:150] Out: 400 [Urine:400] Intake/Output this shift:     Recent Labs  08/22/13 0720 08/24/13 0652  HGB 11.4* 11.1*    Recent Labs  08/22/13 0720 08/24/13 0652  WBC 5.1 4.4  RBC 3.45* 3.31*  HCT 32.5* 31.4*  PLT 69* 69*    Recent Labs  08/24/13 0652  NA 135  K 3.9  CL 102  CO2 24  BUN 4*  CREATININE 0.70  GLUCOSE 106*  CALCIUM 7.3*   No results found for this basename: LABPT, INR,  in the last 72 hours Physical exam patient is much improved there is no signs of infection there is no signs of complications. I removed his drain performed a brief I&D of the arm and overall I feel he looks much better. He is stable for DC tomorrow. I discussed him dressing changes and other plans. Neurologically intact ABD soft Neurovascular intact Sensation intact distally Intact pulses distally No cellulitis present Compartment soft  Assessment/Plan: 2 Days Post-Op Procedure(s) (LRB): IRRIGATION AND DEBRIDEMENT FOREARM COMPLEX WITH WOUND CLOSURE (Right) Plan for discharge tomorrow Patient is ready to transition to a antibiotic by mouth and DC home. He is yet to provide me the number for his wife. I discussed with him that I would like to discuss his progress with his wife and I have asked nursing staff to obtain his number the we have been unable to do so. The phone numbers listed in the chart are not working or disconnected.  He states that he is just fine to  go home and in his wife will pick him up tomorrow. I discussed with him that our plan see him back Monday at 11 AM. I discussed with him the antibiotic regime as an outpatient. [facility] for his diabetic needs. His exam is markedly improved.  Karen Chafe 08/24/2013, 9:19 PM

## 2013-08-24 NOTE — Care Management Note (Signed)
  Page 1 of 1   08/24/2013     10:38:43 AM   CARE MANAGEMENT NOTE 08/24/2013  Patient:  Ernest [name], Ernest [name]   Account Number:  [id number]  Date Initiated:  08/24/2013  Documentation initiated by:  Ronny Flurry  Subjective/Objective Assessment:     Action/Plan:   Anticipated DC Date:  08/24/2013   Anticipated DC Plan:           Choice offered to / List presented to:             Status of service:   Medicare Important Message given?   (If response is "NO", the following Medicare IM given date fields will be blank) Date Medicare IM given:   Date Additional Medicare IM given:    Discharge Disposition:    Per UR Regulation:    If discussed at Long Length of Stay Meetings, dates discussed:    Comments:  08-24-13 Consult from LOS meeting for PCP . Spoke with patient , confirmed he does not have PCP . Explained he can call number on insurance card for a list of PCP in network , or if he knows of MD he can call that MD's office to see if they are accepting new Medicare patient's . Also provided [facility] number if he would like a [place] MD , and provided [facility] and [facility] information. Patient  stated he will discuss with his wife when she visits today and then he will contact a MD .  Confirmed face sheet information .    Ronny Flurry RN BSN [phone]

## 2013-08-24 NOTE — Progress Notes (Signed)
Chart review complete.  Patient is not eligible for [facility] Care Management services because his/her PCP is not a [facility] primary care provider or is not [facility] affiliated.  [facility].  For any additional questions or new referrals please contact Anibal Henderson BSN RN [facility] Liaison at [phone]

## 2013-08-24 NOTE — Discharge Summary (Signed)
Physician Discharge Summary  Patient ID: Ernest [name] MRN: [id number] DOB/AGE: 1956-03-15 57 y.o.  Admit date: 08/17/2013 Discharge date: 08/24/2013  Admission Diagnoses: Abscess right forearm .Marland Kitchen Patient Active Problem List   Diagnosis Date Noted  . Abscess of right arm 08/18/2013  . HTN (hypertension) 08/18/2013  . Type II or unspecified type diabetes mellitus with unspecified complication, uncontrolled 08/18/2013  . Thrombocytopenia, unspecified 08/18/2013    Discharge Diagnoses: Abscess right forearm Principal Problem:   Abscess of right arm Active Problems:   HTN (hypertension)   Type II or unspecified type diabetes mellitus with unspecified complication, uncontrolled   Thrombocytopenia, unspecified   Discharged Condition: fair  Hospital Course:  patient was admitted one week ago for irrigation and debridement right forearm abscess. 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 He underwent multiple surgical I&D's. His thrombocytopenia was certainly an issue with his coagulation and the ecchymosis that he presented with. Ultimately his infection resolved. He was afebrile stable and had much improved one parameters at the time of discharge.  Medicine was consult to for evaluation and we certainly appreciate your input. The patient's diabetes hypertension and thrombocytopenia were all dressed by medicine. The patient was awake alert and oriented without signs of complication DVT or worsening at the time of discharge.  He was able to tolerate his diet void and had normal bowel movements.  He is stable and ready for discharge.  He understands return to my office promptly 11 AM next Monday  Consults: Medicine  Significant Diagnostic Studies: labs: See chart   Treatments: surgery: See chart   Discharge Exam: Blood pressure 151/80, pulse 78, temperature 98.2 F (36.8 C), temperature source Oral, resp. rate 16, height 5\' 9"  (1.753 m), weight 89.086 kg (196 lb  6.4 oz), SpO2 100.00%. General appearance: alert and cooperative Right forearm looks much improved. There is no your edema signs of discharge or signs of necrosis. He is neurovascularly intact he has excellent range of motion sensation and motor function  .Marland KitchenThe patient is alert and oriented in no acute distress the patient complains of pain in the affected upper extremity.  The patient is noted to have a normal HEENT exam.  Lung fields show equal chest expansion and no shortness of breath  abdomen exam is nontender without distention.  Lower extremity examination does not show any fracture dislocation or blood clot symptoms.  Pelvis is stable neck and back are stable and nontender  Disposition: Final discharge disposition not confirmed  Discharge Orders   Future Orders Complete By Expires   Call MD / Call 911  As directed    Comments:     If you experience chest pain or shortness of breath, CALL 911 and be transported to the hospital emergency room.  If you develope a fever above 101 F, pus (white drainage) or increased drainage or redness at the wound, or calf pain, call your surgeon's office.   Constipation Prevention  As directed    Comments:     Drink plenty of fluids.  Prune juice may be helpful.  You may use a stool softener, such as Colace (over the counter) 100 mg twice a day.  Use MiraLax (over the counter) for constipation as needed.   Diet - low sodium heart healthy  As directed    Increase activity slowly as tolerated  As directed        Medication List         amLODipine 10 MG tablet  Commonly known as:  NORVASC  Take 1 tablet (10 mg total) by mouth daily.     clindamycin 150 MG capsule  Commonly known as:  CLEOCIN  Take 450 mg by mouth 3 (three) times daily.     doxycycline 50 MG capsule  Commonly known as:  VIBRAMYCIN  Take 1 capsule (50 mg total) by mouth 2 (two) times daily.     folic acid 1 MG tablet  Commonly known as:  FOLVITE  Take 1 mg by mouth daily.      glipiZIDE 10 MG tablet  Commonly known as:  GLUCOTROL  Take 10 mg by mouth 2 (two) times daily before a meal.     insulin glargine 100 UNIT/ML injection  Commonly known as:  LANTUS  Inject 0.2 mLs (20 Units total) into the skin 2 (two) times daily.     insulin glargine 100 UNIT/ML injection  Commonly known as:  LANTUS  Inject 50 Units into the skin 2 (two) times daily.     lisinopril-hydrochlorothiazide 20-25 MG per tablet  Commonly known as:  PRINZIDE,ZESTORETIC  Take 1 tablet by mouth daily.     lisinopril-hydrochlorothiazide 20-25 MG per tablet  Commonly known as:  PRINZIDE,ZESTORETIC  Take 1 tablet by mouth daily.     metFORMIN 500 MG 24 hr tablet  Commonly known as:  GLUCOPHAGE-XR  Take 1,000 mg by mouth 2 (two) times daily.     multivitamin with minerals tablet  Take 1 tablet by mouth daily.     omeprazole 20 MG capsule  Commonly known as:  PRILOSEC  Take 40 mg by mouth daily.     oxyCODONE 5 MG immediate release tablet  Commonly known as:  Oxy IR/ROXICODONE  Take 1-2 tablets (5-10 mg total) by mouth every 3 (three) hours as needed for moderate pain.     potassium chloride SA 20 MEQ tablet  Commonly known as:  K-DUR,KLOR-CON  Take 0.5 tablets (10 mEq total) by mouth daily.     propranolol 40 MG tablet  Commonly known as:  INDERAL  Take 40 mg by mouth 2 (two) times daily.     thiamine 100 MG tablet  Commonly known as:  VITAMIN B-1  Take 100 mg by mouth daily.           Follow-up Information   Follow up with No PCP Per Patient.   Specialty:  General Practice   Contact information:   [address] [place] [address] [phone]       Follow up with [facility]. Schedule an appointment as soon as possible for a visit in 1 week. (As needed)    Contact information:   [address] [place] [address] [phone]      Follow up with Karen Chafe, MD. (Please come to the office Monday at 11 AM  for your followup to see Dr. Amanda Pea)    Specialty:  Orthopedic Surgery   Contact information:   [address] [place] [address] [phone]       Signed: Karen Chafe 08/24/2013, 9:29 PM

## 2013-08-25 LAB — GLUCOSE, CAPILLARY: Glucose-Capillary: 66 mg/dL — ABNORMAL LOW (ref 70–99)

## 2013-08-25 NOTE — Progress Notes (Signed)
CSW attempted to reach Pt Wife to discuss d/c planning and Pt wife was not available at any of the numbers provided in the chart ([phone] Wk--[phone]--cell).   CSW left multiple messages at above numbers and is awaiting a call back for d/c planning. CSW will continue to try and contact Pt wife for d/c planning.   CSW will continue to follow Pt for d/c planning.    Leron Croak [facility]  [address];  [phone] Phone: [phone]

## 2013-08-25 NOTE — Progress Notes (Signed)
Pt to d/c home with HHRN/HHSW to address listed in EPIC. The correct phone number for reaching patient/wife is [phone].  Pt and wife both stated that he had used [facility] in the past and wished to use them again.  I reviewed signs and symptoms of infection with pt's wife and with pt so they would know what to watch for and to contact MD office. Also reviewed with wife the pt's follow up appointment with MD and the specific instructions for wound care as dictated by MD.

## 2013-08-25 NOTE — Progress Notes (Signed)
1240 Pt. Discharged to home with wife.  All discharge instructions provided with scripts. RN reviewed dressing care with wife per MD request. No further questions asked.  Escorted to vehicle by transport services.

## 2013-08-25 NOTE — Progress Notes (Signed)
Patient ID: Ernest [name], male   DOB: June 27, 1956, 57 y.o.   MRN: [id number] Patient is seen at bedside.  I discussed with  nursing staff my concerns in regards to discharge.  At present time we have performed a dressing change to the arm. I would recommend home health to change his dressing daily given the fact that I fear he is not able to perform this one handed himself. I have recommended discharge with the antibiotic protocol as mentioned and noted in his discharge orders.  I have gone through these issues in detail with the patient and he seems to verbalize understanding.  I gets discussed issues with his wife and make every human attempt possible to do so.  Denise Washburn

## 2013-09-30 LAB — AFB CULTURE WITH SMEAR (NOT AT ARMC): Acid Fast Smear: NONE SEEN

## 2016-04-07 HISTORY — PX: BRAIN SURGERY: SHX531

## 2016-04-16 ENCOUNTER — Inpatient Hospital Stay
Admission: AD | Admit: 2016-04-16 | Discharge: 2016-04-26 | Disposition: A | Discharge status: Rehabilitation Facility | Payer: Self-pay | Source: Other Acute Inpatient Hospital | Attending: Physical Medicine & Rehabilitation | Admitting: Physical Medicine & Rehabilitation

## 2016-04-16 DIAGNOSIS — I611 Nontraumatic intracerebral hemorrhage in hemisphere, cortical: Secondary | ICD-10-CM | POA: Diagnosis present

## 2016-04-16 DIAGNOSIS — I639 Cerebral infarction, unspecified: Secondary | ICD-10-CM

## 2016-04-16 DIAGNOSIS — R109 Unspecified abdominal pain: Secondary | ICD-10-CM

## 2016-04-16 HISTORY — DX: Obstructive sleep apnea (adult) (pediatric): G47.33

## 2016-04-16 HISTORY — DX: Epilepsy, unspecified, not intractable, without status epilepticus: G40.909

## 2016-04-16 HISTORY — DX: Cerebral infarction, unspecified: I63.9

## 2016-04-16 HISTORY — DX: Gastro-esophageal reflux disease without esophagitis: K21.9

## 2016-04-16 HISTORY — DX: Heart failure, unspecified: I50.9

## 2016-04-17 ENCOUNTER — Other Ambulatory Visit (HOSPITAL_COMMUNITY): Payer: Self-pay

## 2016-04-17 LAB — COMPREHENSIVE METABOLIC PANEL
ALT: 36 U/L (ref 17–63)
AST: 49 U/L — AB (ref 15–41)
Albumin: 1.7 g/dL — ABNORMAL LOW (ref 3.5–5.0)
Alkaline Phosphatase: 162 U/L — ABNORMAL HIGH (ref 38–126)
Anion gap: 6 (ref 5–15)
BUN: 15 mg/dL (ref 6–20)
CO2: 24 mmol/L (ref 22–32)
Calcium: 8.3 mg/dL — ABNORMAL LOW (ref 8.9–10.3)
Chloride: 106 mmol/L (ref 101–111)
Creatinine, Ser: 1.4 mg/dL — ABNORMAL HIGH (ref 0.61–1.24)
GFR calc Af Amer: >60 mL/min (ref >60)
GFR, EST NON AFRICAN AMERICAN: 54 mL/min — AB (ref >60)
Glucose, Bld: 223 mg/dL — ABNORMAL HIGH (ref 65–99)
POTASSIUM: 3.5 mmol/L (ref 3.5–5.1)
SODIUM: 136 mmol/L (ref 135–145)
Total Bilirubin: 0.4 mg/dL (ref 0.3–1.2)
Total Protein: 5.3 g/dL — ABNORMAL LOW (ref 6.5–8.1)

## 2016-04-17 LAB — CBC
HCT: 32.5 % — ABNORMAL LOW (ref 39.0–52.0)
Hemoglobin: 11 g/dL — ABNORMAL LOW (ref 13.0–17.0)
MCH: 29.8 pg (ref 26.0–34.0)
MCHC: 33.8 g/dL (ref 30.0–36.0)
MCV: 88.1 fL (ref 78.0–100.0)
PLATELETS: 139 10*3/uL — AB (ref 150–400)
RBC: 3.69 MIL/uL — ABNORMAL LOW (ref 4.22–5.81)
RDW: 13.1 % (ref 11.5–15.5)
WBC: 5.4 10*3/uL (ref 4.0–10.5)

## 2016-04-18 LAB — BASIC METABOLIC PANEL
ANION GAP: 6 (ref 5–15)
BUN: 14 mg/dL (ref 6–20)
CALCIUM: 8.6 mg/dL — AB (ref 8.9–10.3)
CO2: 26 mmol/L (ref 22–32)
CREATININE: 1.3 mg/dL — AB (ref 0.61–1.24)
Chloride: 105 mmol/L (ref 101–111)
GFR, EST NON AFRICAN AMERICAN: 59 mL/min — AB (ref >60)
Glucose, Bld: 128 mg/dL — ABNORMAL HIGH (ref 65–99)
Potassium: 3.4 mmol/L — ABNORMAL LOW (ref 3.5–5.1)
SODIUM: 137 mmol/L (ref 135–145)

## 2016-04-18 LAB — CBC
HEMATOCRIT: 36.2 % — AB (ref 39.0–52.0)
Hemoglobin: 12.1 g/dL — ABNORMAL LOW (ref 13.0–17.0)
MCH: 29.5 pg (ref 26.0–34.0)
MCHC: 33.4 g/dL (ref 30.0–36.0)
MCV: 88.3 fL (ref 78.0–100.0)
PLATELETS: 146 10*3/uL — AB (ref 150–400)
RBC: 4.1 MIL/uL — ABNORMAL LOW (ref 4.22–5.81)
RDW: 12.8 % (ref 11.5–15.5)
WBC: 6.1 10*3/uL (ref 4.0–10.5)

## 2016-04-18 LAB — MAGNESIUM: MAGNESIUM: 1.4 mg/dL — AB (ref 1.7–2.4)

## 2016-04-21 LAB — BASIC METABOLIC PANEL
ANION GAP: 7 (ref 5–15)
BUN: 15 mg/dL (ref 6–20)
CO2: 25 mmol/L (ref 22–32)
Calcium: 8.6 mg/dL — ABNORMAL LOW (ref 8.9–10.3)
Chloride: 104 mmol/L (ref 101–111)
Creatinine, Ser: 1.11 mg/dL (ref 0.61–1.24)
GFR calc Af Amer: >60 mL/min (ref >60)
GFR calc non Af Amer: >60 mL/min (ref >60)
GLUCOSE: 47 mg/dL — AB (ref 65–99)
POTASSIUM: 3.3 mmol/L — AB (ref 3.5–5.1)
Sodium: 136 mmol/L (ref 135–145)

## 2016-04-21 LAB — MAGNESIUM: Magnesium: 1.5 mg/dL — ABNORMAL LOW (ref 1.7–2.4)

## 2016-04-21 LAB — CBC
HEMATOCRIT: 34.7 % — AB (ref 39.0–52.0)
Hemoglobin: 11.6 g/dL — ABNORMAL LOW (ref 13.0–17.0)
MCH: 29.4 pg (ref 26.0–34.0)
MCHC: 33.4 g/dL (ref 30.0–36.0)
MCV: 87.8 fL (ref 78.0–100.0)
Platelets: 149 10*3/uL — ABNORMAL LOW (ref 150–400)
RBC: 3.95 MIL/uL — AB (ref 4.22–5.81)
RDW: 12.5 % (ref 11.5–15.5)
WBC: 8.4 10*3/uL (ref 4.0–10.5)

## 2016-04-22 ENCOUNTER — Other Ambulatory Visit (HOSPITAL_COMMUNITY): Payer: Self-pay

## 2016-04-22 LAB — POTASSIUM
POTASSIUM: 3.2 mmol/L — AB (ref 3.5–5.1)
Potassium: 3.2 mmol/L — ABNORMAL LOW (ref 3.5–5.1)

## 2016-04-22 LAB — MAGNESIUM: MAGNESIUM: 1.6 mg/dL — AB (ref 1.7–2.4)

## 2016-04-23 LAB — POTASSIUM: Potassium: 3.4 mmol/L — ABNORMAL LOW (ref 3.5–5.1)

## 2016-04-24 ENCOUNTER — Encounter: Payer: Self-pay | Admitting: *Deleted

## 2016-04-24 LAB — CBC
HCT: 31.1 % — ABNORMAL LOW (ref 39.0–52.0)
HEMOGLOBIN: 10.3 g/dL — AB (ref 13.0–17.0)
MCH: 29.5 pg (ref 26.0–34.0)
MCHC: 33.1 g/dL (ref 30.0–36.0)
MCV: 89.1 fL (ref 78.0–100.0)
Platelets: 153 10*3/uL (ref 150–400)
RBC: 3.49 MIL/uL — ABNORMAL LOW (ref 4.22–5.81)
RDW: 12.6 % (ref 11.5–15.5)
WBC: 5.9 10*3/uL (ref 4.0–10.5)

## 2016-04-24 LAB — BASIC METABOLIC PANEL
Anion gap: 6 (ref 5–15)
BUN: 23 mg/dL — AB (ref 6–20)
CHLORIDE: 98 mmol/L — AB (ref 101–111)
CO2: 29 mmol/L (ref 22–32)
Calcium: 8.2 mg/dL — ABNORMAL LOW (ref 8.9–10.3)
Creatinine, Ser: 1.25 mg/dL — ABNORMAL HIGH (ref 0.61–1.24)
GFR calc Af Amer: >60 mL/min (ref >60)
GLUCOSE: 147 mg/dL — AB (ref 65–99)
POTASSIUM: 4.2 mmol/L (ref 3.5–5.1)
Sodium: 133 mmol/L — ABNORMAL LOW (ref 135–145)

## 2016-04-26 ENCOUNTER — Inpatient Hospital Stay (HOSPITAL_COMMUNITY)
Admission: RE | Admit: 2016-04-26 | Discharge: 2016-05-16 | DRG: 091 | Disposition: A | Discharge status: Home Health | Payer: Medicare Other | Source: Intra-hospital | Attending: Physical Medicine & Rehabilitation | Admitting: Physical Medicine & Rehabilitation

## 2016-04-26 ENCOUNTER — Encounter: Payer: Self-pay | Admitting: Physical Medicine and Rehabilitation

## 2016-04-26 ENCOUNTER — Encounter (HOSPITAL_COMMUNITY): Payer: Medicare Other

## 2016-04-26 DIAGNOSIS — I69120 Aphasia following nontraumatic intracerebral hemorrhage: Secondary | ICD-10-CM

## 2016-04-26 DIAGNOSIS — I10 Essential (primary) hypertension: Secondary | ICD-10-CM | POA: Diagnosis present

## 2016-04-26 DIAGNOSIS — F4321 Adjustment disorder with depressed mood: Secondary | ICD-10-CM | POA: Diagnosis present

## 2016-04-26 DIAGNOSIS — R339 Retention of urine, unspecified: Secondary | ICD-10-CM | POA: Diagnosis present

## 2016-04-26 DIAGNOSIS — D62 Acute posthemorrhagic anemia: Secondary | ICD-10-CM | POA: Diagnosis present

## 2016-04-26 DIAGNOSIS — K219 Gastro-esophageal reflux disease without esophagitis: Secondary | ICD-10-CM | POA: Diagnosis present

## 2016-04-26 DIAGNOSIS — E876 Hypokalemia: Secondary | ICD-10-CM | POA: Diagnosis not present

## 2016-04-26 DIAGNOSIS — F419 Anxiety disorder, unspecified: Secondary | ICD-10-CM | POA: Diagnosis present

## 2016-04-26 DIAGNOSIS — Z794 Long term (current) use of insulin: Secondary | ICD-10-CM

## 2016-04-26 DIAGNOSIS — R4587 Impulsiveness: Secondary | ICD-10-CM | POA: Diagnosis present

## 2016-04-26 DIAGNOSIS — I611 Nontraumatic intracerebral hemorrhage in hemisphere, cortical: Secondary | ICD-10-CM | POA: Diagnosis present

## 2016-04-26 DIAGNOSIS — E8770 Fluid overload, unspecified: Secondary | ICD-10-CM | POA: Diagnosis present

## 2016-04-26 DIAGNOSIS — R1310 Dysphagia, unspecified: Secondary | ICD-10-CM | POA: Diagnosis present

## 2016-04-26 DIAGNOSIS — R414 Neurologic neglect syndrome: Secondary | ICD-10-CM | POA: Diagnosis present

## 2016-04-26 DIAGNOSIS — Z8673 Personal history of transient ischemic attack (TIA), and cerebral infarction without residual deficits: Secondary | ICD-10-CM | POA: Diagnosis not present

## 2016-04-26 DIAGNOSIS — G4733 Obstructive sleep apnea (adult) (pediatric): Secondary | ICD-10-CM | POA: Diagnosis present

## 2016-04-26 DIAGNOSIS — R4189 Other symptoms and signs involving cognitive functions and awareness: Secondary | ICD-10-CM | POA: Diagnosis present

## 2016-04-26 DIAGNOSIS — G40909 Epilepsy, unspecified, not intractable, without status epilepticus: Secondary | ICD-10-CM | POA: Diagnosis present

## 2016-04-26 DIAGNOSIS — R2689 Other abnormalities of gait and mobility: Secondary | ICD-10-CM | POA: Diagnosis present

## 2016-04-26 DIAGNOSIS — I69319 Unspecified symptoms and signs involving cognitive functions following cerebral infarction: Secondary | ICD-10-CM | POA: Diagnosis not present

## 2016-04-26 DIAGNOSIS — Z91013 Allergy to seafood: Secondary | ICD-10-CM

## 2016-04-26 DIAGNOSIS — E119 Type 2 diabetes mellitus without complications: Secondary | ICD-10-CM | POA: Diagnosis present

## 2016-04-26 DIAGNOSIS — Z79899 Other long term (current) drug therapy: Secondary | ICD-10-CM

## 2016-04-26 DIAGNOSIS — F17210 Nicotine dependence, cigarettes, uncomplicated: Secondary | ICD-10-CM | POA: Diagnosis present

## 2016-04-26 DIAGNOSIS — N390 Urinary tract infection, site not specified: Secondary | ICD-10-CM | POA: Diagnosis present

## 2016-04-26 DIAGNOSIS — I69254 Hemiplegia and hemiparesis following other nontraumatic intracranial hemorrhage affecting left non-dominant side: Secondary | ICD-10-CM

## 2016-04-26 DIAGNOSIS — E1165 Type 2 diabetes mellitus with hyperglycemia: Secondary | ICD-10-CM | POA: Diagnosis not present

## 2016-04-26 DIAGNOSIS — E8779 Other fluid overload: Secondary | ICD-10-CM

## 2016-04-26 DIAGNOSIS — R410 Disorientation, unspecified: Secondary | ICD-10-CM

## 2016-04-26 LAB — GLUCOSE, CAPILLARY
Glucose-Capillary: 133 mg/dL — ABNORMAL HIGH (ref 65–99)
Glucose-Capillary: 196 mg/dL — ABNORMAL HIGH (ref 65–99)

## 2016-04-26 LAB — CREATININE, SERUM: CREATININE: 0.98 mg/dL (ref 0.61–1.24)

## 2016-04-26 LAB — MAGNESIUM: Magnesium: 1.3 mg/dL — ABNORMAL LOW (ref 1.7–2.4)

## 2016-04-26 MED ORDER — CYCLOBENZAPRINE HCL 5 MG PO TABS: 5.0000 mg | ORAL_TABLET | Freq: Every day | ORAL | Status: DC

## 2016-04-26 MED ORDER — DIPHENHYDRAMINE HCL 12.5 MG/5ML PO ELIX: 12.5000 mg | ORAL_SOLUTION | Freq: Four times a day (QID) | ORAL | Status: DC | PRN

## 2016-04-26 MED ORDER — TAMSULOSIN HCL 0.4 MG PO CAPS
0.4000 mg | ORAL_CAPSULE | Freq: Every day | ORAL | Status: DC
  Administered 2016-04-26 – 2016-05-15 (×19): 0.4 mg via ORAL
  Filled 2016-04-26 (×19): qty 1

## 2016-04-26 MED ORDER — DULOXETINE HCL 30 MG PO CPEP: 30.0000 mg | ORAL_CAPSULE | Freq: Every day | ORAL | Status: DC

## 2016-04-26 MED ORDER — LISINOPRIL 20 MG PO TABS
20.0000 mg | ORAL_TABLET | Freq: Every day | ORAL | Status: DC
  Administered 2016-04-27 – 2016-05-06 (×10): 20 mg via ORAL
  Filled 2016-04-26 (×10): qty 1

## 2016-04-26 MED ORDER — OXYCODONE HCL 5 MG PO TABS: 2.5000 mg | ORAL_TABLET | Freq: Four times a day (QID) | ORAL | Status: DC | PRN

## 2016-04-26 MED ORDER — ATORVASTATIN CALCIUM 20 MG PO TABS: 20.0000 mg | ORAL_TABLET | Freq: Every day | ORAL | Status: DC

## 2016-04-26 MED ORDER — TRAZODONE 25 MG HALF TABLET: 25.0000 mg | ORAL_TABLET | Freq: Every evening | ORAL | Status: DC | PRN

## 2016-04-26 MED ORDER — SERTRALINE HCL 25 MG PO TABS: 25.0000 mg | ORAL_TABLET | Freq: Every day | ORAL | Status: DC

## 2016-04-26 MED ORDER — PRO-STAT SUGAR FREE PO LIQD
30.0000 mL | Freq: Two times a day (BID) | ORAL | Status: DC
  Administered 2016-04-26 – 2016-05-16 (×36): 30 mL via ORAL
  Filled 2016-04-26 (×36): qty 30

## 2016-04-26 MED ORDER — IPRATROPIUM-ALBUTEROL 0.5-2.5 (3) MG/3ML IN SOLN: 3.0000 mL | RESPIRATORY_TRACT | Status: DC | PRN

## 2016-04-26 MED ORDER — ALUM & MAG HYDROXIDE-SIMETH 200-200-20 MG/5ML PO SUSP: 30.0000 mL | ORAL | Status: DC | PRN

## 2016-04-26 MED ORDER — FLEET ENEMA 7-19 GM/118ML RE ENEM: 1.0000 | ENEMA | Freq: Once | RECTAL | Status: DC | PRN

## 2016-04-26 MED ORDER — HYDRALAZINE HCL 25 MG PO TABS: 75.0000 mg | ORAL_TABLET | Freq: Three times a day (TID) | ORAL | Status: DC

## 2016-04-26 MED ORDER — SERTRALINE HCL 50 MG PO TABS
25.0000 mg | ORAL_TABLET | Freq: Every day | ORAL | Status: DC
  Administered 2016-04-26 – 2016-05-03 (×8): 25 mg via ORAL
  Filled 2016-04-26 (×8): qty 1

## 2016-04-26 MED ORDER — TRAZODONE HCL 50 MG PO TABS
25.0000 mg | ORAL_TABLET | Freq: Every evening | ORAL | Status: DC | PRN
  Administered 2016-04-28: 50 mg via ORAL
  Filled 2016-04-26: qty 1

## 2016-04-26 MED ORDER — IPRATROPIUM-ALBUTEROL 0.5-2.5 (3) MG/3ML IN SOLN
3.0000 mL | Freq: Four times a day (QID) | RESPIRATORY_TRACT | Status: DC
  Filled 2016-04-26: qty 3

## 2016-04-26 MED ORDER — HYDRALAZINE HCL 50 MG PO TABS: 75.0000 mg | ORAL_TABLET | Freq: Three times a day (TID) | ORAL | Status: DC

## 2016-04-26 MED ORDER — VITAMIN B-1 100 MG PO TABS: 100.0000 mg | ORAL_TABLET | Freq: Every day | ORAL | Status: DC

## 2016-04-26 MED ORDER — LEVETIRACETAM 500 MG PO TABS: 500.0000 mg | ORAL_TABLET | Freq: Two times a day (BID) | ORAL | Status: DC

## 2016-04-26 MED ORDER — CLONIDINE HCL 0.1 MG PO TABS
0.1000 mg | ORAL_TABLET | Freq: Three times a day (TID) | ORAL | Status: DC
  Administered 2016-04-26 – 2016-04-29 (×9): 0.1 mg via ORAL
  Filled 2016-04-26 (×9): qty 1

## 2016-04-26 MED ORDER — FUROSEMIDE 40 MG PO TABS: 60.0000 mg | ORAL_TABLET | Freq: Two times a day (BID) | ORAL | Status: DC

## 2016-04-26 MED ORDER — TAMSULOSIN HCL 0.4 MG PO CAPS: 0.4000 mg | ORAL_CAPSULE | Freq: Every day | ORAL | Status: DC

## 2016-04-26 MED ORDER — SENNOSIDES-DOCUSATE SODIUM 8.6-50 MG PO TABS: 1.0000 | ORAL_TABLET | Freq: Every evening | ORAL | Status: DC | PRN

## 2016-04-26 MED ORDER — LABETALOL HCL 200 MG PO TABS: 400.0000 mg | ORAL_TABLET | Freq: Two times a day (BID) | ORAL | Status: DC

## 2016-04-26 MED ORDER — GUAIFENESIN-DM 100-10 MG/5ML PO SYRP: 5.0000 mL | ORAL_SOLUTION | Freq: Four times a day (QID) | ORAL | Status: DC | PRN

## 2016-04-26 MED ORDER — BISACODYL 10 MG RE SUPP: 10.0000 mg | Freq: Every day | RECTAL | Status: DC | PRN

## 2016-04-26 MED ORDER — LIDOCAINE HCL 2 % EX GEL: CUTANEOUS | Status: DC | PRN

## 2016-04-26 MED ORDER — PROCHLORPERAZINE MALEATE 5 MG PO TABS: 5.0000 mg | ORAL_TABLET | Freq: Four times a day (QID) | ORAL | Status: DC | PRN

## 2016-04-26 MED ORDER — SERTRALINE HCL 50 MG PO TABS: 50.0000 mg | ORAL_TABLET | Freq: Every day | ORAL | Status: DC

## 2016-04-26 MED ORDER — ACETAMINOPHEN 325 MG PO TABS: 325.0000 mg | ORAL_TABLET | ORAL | Status: DC | PRN

## 2016-04-26 MED ORDER — FAMOTIDINE 20 MG PO TABS: 20.0000 mg | ORAL_TABLET | Freq: Two times a day (BID) | ORAL | Status: DC

## 2016-04-26 MED ORDER — FOLIC ACID 1 MG PO TABS: 1.0000 mg | ORAL_TABLET | Freq: Every day | ORAL | Status: DC

## 2016-04-26 MED ORDER — INSULIN GLARGINE 100 UNITS/ML SOLOSTAR PEN: 15.0000 [IU] | PEN_INJECTOR | Freq: Two times a day (BID) | SUBCUTANEOUS | Status: DC

## 2016-04-26 MED ORDER — FAMOTIDINE 10 MG PO TABS: 10.0000 mg | ORAL_TABLET | Freq: Two times a day (BID) | ORAL | Status: DC

## 2016-04-26 MED ORDER — ACETAMINOPHEN 325 MG PO TABS
325.0000 mg | ORAL_TABLET | ORAL | Status: DC | PRN
Start: 2016-04-26 — End: 2016-05-16
  Administered 2016-04-27 – 2016-05-02 (×9): 650 mg via ORAL
  Filled 2016-04-26 (×11): qty 2

## 2016-04-26 MED ORDER — CLONIDINE HCL 0.1 MG PO TABS: 0.1000 mg | ORAL_TABLET | Freq: Three times a day (TID) | ORAL | Status: DC

## 2016-04-26 MED ORDER — PROCHLORPERAZINE 25 MG RE SUPP: 12.5000 mg | Freq: Four times a day (QID) | RECTAL | Status: DC | PRN

## 2016-04-26 MED ORDER — POTASSIUM CHLORIDE CRYS ER 20 MEQ PO TBCR: 20.0000 meq | EXTENDED_RELEASE_TABLET | Freq: Two times a day (BID) | ORAL | Status: DC

## 2016-04-26 MED ORDER — ENOXAPARIN SODIUM 40 MG/0.4ML ~~LOC~~ SOLN: 40.0000 mg | SUBCUTANEOUS | Status: DC

## 2016-04-26 MED ORDER — POTASSIUM CHLORIDE CRYS ER 20 MEQ PO TBCR
20.0000 meq | EXTENDED_RELEASE_TABLET | Freq: Two times a day (BID) | ORAL | Status: DC
Start: 2016-04-26 — End: 2016-04-26

## 2016-04-26 MED ORDER — DULOXETINE HCL 30 MG PO CPEP
30.0000 mg | ORAL_CAPSULE | Freq: Every day | ORAL | Status: DC
  Administered 2016-04-27 – 2016-05-03 (×7): 30 mg via ORAL
  Filled 2016-04-26 (×7): qty 1

## 2016-04-26 MED ORDER — INSULIN ASPART 100 UNIT/ML ~~LOC~~ SOLN
0.0000 [IU] | Freq: Every day | SUBCUTANEOUS | Status: DC
  Administered 2016-04-27 – 2016-05-09 (×3): 2 [IU] via SUBCUTANEOUS

## 2016-04-26 MED ORDER — LISINOPRIL 10 MG PO TABS
10.0000 mg | ORAL_TABLET | Freq: Every day | ORAL | Status: DC
Start: 2016-04-27 — End: 2016-04-26

## 2016-04-26 MED ORDER — PROCHLORPERAZINE EDISYLATE 5 MG/ML IJ SOLN: 5.0000 mg | Freq: Four times a day (QID) | INTRAMUSCULAR | Status: DC | PRN

## 2016-04-26 MED ORDER — HYDRALAZINE HCL 50 MG PO TABS
75.0000 mg | ORAL_TABLET | Freq: Three times a day (TID) | ORAL | Status: DC
  Administered 2016-04-26 – 2016-05-04 (×31): 75 mg via ORAL
  Filled 2016-04-26 (×32): qty 1

## 2016-04-26 MED ORDER — PRO-STAT SUGAR FREE PO LIQD: 30.0000 mL | Freq: Two times a day (BID) | ORAL | Status: DC

## 2016-04-26 MED ORDER — CYCLOBENZAPRINE HCL 10 MG PO TABS: 10.0000 mg | ORAL_TABLET | Freq: Three times a day (TID) | ORAL | Status: DC

## 2016-04-26 MED ORDER — POLYETHYLENE GLYCOL 3350 17 G PO PACK
17.0000 g | PACK | Freq: Every day | ORAL | Status: DC
  Administered 2016-04-26 – 2016-05-16 (×18): 17 g via ORAL
  Filled 2016-04-26 (×20): qty 1

## 2016-04-26 MED ORDER — VITAMIN D 1000 UNITS PO TABS
1000.0000 [IU] | ORAL_TABLET | Freq: Every day | ORAL | Status: DC
  Administered 2016-04-27 – 2016-05-16 (×19): 1000 [IU] via ORAL
  Filled 2016-04-26 (×19): qty 1

## 2016-04-26 MED ORDER — CALCIUM CARBONATE-VITAMIN D 500-200 MG-UNIT PO TABS: 1.0000 | ORAL_TABLET | Freq: Every day | ORAL | Status: DC

## 2016-04-26 MED ORDER — INSULIN GLARGINE 100 UNIT/ML ~~LOC~~ SOLN: 15.0000 [IU] | Freq: Two times a day (BID) | SUBCUTANEOUS | Status: DC

## 2016-04-26 MED ORDER — INSULIN ASPART 100 UNIT/ML ~~LOC~~ SOLN
0.0000 [IU] | Freq: Three times a day (TID) | SUBCUTANEOUS | Status: DC
  Administered 2016-04-26: 1 [IU] via SUBCUTANEOUS
  Administered 2016-04-27: 3 [IU] via SUBCUTANEOUS
  Administered 2016-04-27 (×2): 2 [IU] via SUBCUTANEOUS
  Administered 2016-04-28: 3 [IU] via SUBCUTANEOUS
  Administered 2016-04-28 (×2): 2 [IU] via SUBCUTANEOUS
  Administered 2016-04-29 (×2): 1 [IU] via SUBCUTANEOUS
  Administered 2016-04-29 – 2016-04-30 (×2): 2 [IU] via SUBCUTANEOUS
  Administered 2016-04-30 – 2016-05-01 (×2): 1 [IU] via SUBCUTANEOUS
  Administered 2016-05-01: 3 [IU] via SUBCUTANEOUS
  Administered 2016-05-01 – 2016-05-02 (×2): 1 [IU] via SUBCUTANEOUS
  Administered 2016-05-02: 2 [IU] via SUBCUTANEOUS
  Administered 2016-05-02 – 2016-05-03 (×2): 1 [IU] via SUBCUTANEOUS
  Administered 2016-05-03 – 2016-05-04 (×2): 2 [IU] via SUBCUTANEOUS
  Administered 2016-05-04: 3 [IU] via SUBCUTANEOUS
  Administered 2016-05-05: 2 [IU] via SUBCUTANEOUS
  Administered 2016-05-05: 1 [IU] via SUBCUTANEOUS
  Administered 2016-05-06: 2 [IU] via SUBCUTANEOUS
  Administered 2016-05-06: 1 [IU] via SUBCUTANEOUS
  Administered 2016-05-07: 2 [IU] via SUBCUTANEOUS
  Administered 2016-05-07: 1 [IU] via SUBCUTANEOUS
  Administered 2016-05-08 (×2): 2 [IU] via SUBCUTANEOUS
  Administered 2016-05-09: 3 [IU] via SUBCUTANEOUS
  Administered 2016-05-09: 2 [IU] via SUBCUTANEOUS
  Administered 2016-05-10: 3 [IU] via SUBCUTANEOUS
  Administered 2016-05-10: 5 [IU] via SUBCUTANEOUS
  Administered 2016-05-10: 1 [IU] via SUBCUTANEOUS
  Administered 2016-05-11: 3 [IU] via SUBCUTANEOUS
  Administered 2016-05-11: 1 [IU] via SUBCUTANEOUS
  Administered 2016-05-11: 2 [IU] via SUBCUTANEOUS
  Administered 2016-05-12: 3 [IU] via SUBCUTANEOUS
  Administered 2016-05-12 – 2016-05-13 (×2): 1 [IU] via SUBCUTANEOUS
  Administered 2016-05-13: 2 [IU] via SUBCUTANEOUS
  Administered 2016-05-13: 3 [IU] via SUBCUTANEOUS
  Administered 2016-05-15 (×2): 2 [IU] via SUBCUTANEOUS
  Administered 2016-05-15: 1 [IU] via SUBCUTANEOUS
  Administered 2016-05-16: 5 [IU] via SUBCUTANEOUS

## 2016-04-26 MED ORDER — CYCLOBENZAPRINE HCL 10 MG PO TABS
10.0000 mg | ORAL_TABLET | Freq: Every day | ORAL | Status: DC
  Administered 2016-04-26 – 2016-04-28 (×3): 10 mg via ORAL
  Filled 2016-04-26 (×3): qty 1

## 2016-04-26 MED ORDER — FOLIC ACID 1 MG PO TABS
1.0000 mg | ORAL_TABLET | Freq: Every day | ORAL | Status: DC
  Administered 2016-04-26 – 2016-05-16 (×20): 1 mg via ORAL
  Filled 2016-04-26 (×20): qty 1

## 2016-04-26 NOTE — Interval H&P Note (Signed)
Benyamin Nakamura was admitted today to Inpatient Rehabilitation with the diagnosis of Right ICH.  The patient's history has been reviewed, patient examined, and there is no change in status.  Patient continues to be appropriate for intensive inpatient rehabilitation.  I have reviewed the patient's chart and labs.  Questions were answered to the patient's satisfaction. The PAPE has been reviewed and assessment remains appropriate.  SWARTZ,ZACHARY T 04/26/2016, 4:08 PM

## 2016-04-26 NOTE — PMR Pre-admission (Signed)
[facility] PMR Admission Coordinator Pre-Admission Assessment  Patient: Ernest [name] is an 60 y.o., male MRN: [id number] DOB: Feb 10, 1956 Height:  (172.7 cm) Weight: 117 kg (258 lb)  Insurance Information HMO: No   PPO:       PCP:       IPA:       80/20:       OTHER:   PRIMARY: Medicare A/B      Policy#: [id number]      Subscriber: Corrinne Eagle CM Name:        Phone#:       Fax#:   Pre-Cert#:        Employer: Retired Benefits:  Phone #:       Name: Checked in [place]. Date: 03/31/11     Deduct: $1316      Out of Pocket Max: none      Life Max: unlimited CIR: 100%      SNF: 100 days Outpatient: 80%     Co-Pay: 20% Home Health: 100%      Co-Pay: none DME: 80%     Co-Pay: 20% Providers: patient's choice  Medicaid Application Date:        Case Manager:   Disability Application Date:        Case Worker:    Emergency Contact Information Contact Information    Name Relation Home Work Mobile   Wykoff Spouse [phone]  [phone]   Harvel Quale   [phone]      Current Medical History  Patient Admitting Diagnosis:  Right temporal/parietal hemorrhagic stroke  History of Present Illness: A 60 yo with history of HTN, seizure disorder, DM, 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  CT head showed large acute intraparenchymal hematoma involving the right parietal and temporal region.  Patient noted to have left hemiplegia and left sided neglect.  Neurosurgery Dr. Lisette Grinder consulted.  On 04/09/16 a significant decline in neurologic status was noted.  An emegent right craniotomy was performed with evacuation of intracerebral hematoma.  Medications were adjusted for hypertension.  Anasarca developed with lasix administered.  Patient was admitted to [facility] on 04/16/16 for further therapies and medical management.  Patient is confused at this time.  Currently  receiving PT/OT/SLP on going therapies.  Dys 2 diet with thin liquids initiated by SLP.  Felt to be a good candidate for inpatient rehab with goals to return home with family post inpatient rehab stay.  Patient's medical record from [facility] has been reviewed by the rehabilitation admission coordinator and physician.  Past Medical History  Past Medical History:  Diagnosis Date  . Diabetes mellitus without complication   . Hypertension   CHF, GERD, Hypercholesterolemia, OSA  Family History   family history is not on file.  Prior Rehab/Hospitalizations Has the patient had major surgery during 100 days prior to admission? No    Current Medications See MAR from [facility]  Patients Current Diet:   Dys 2, thin liquids (SLP notes say can upgrade to Dys 3)  Precautions / Restrictions Precautions Precautions: Fall Precaution Comments: Swallowing precautions   Has the patient had 2 or more falls or a fall with injury in the past year?No  Prior Activity Level Community (5-7x/wk): Went out daily.  Wife drives most of the time.  Prior Functional Level Self Care: Did the patient need help bathing, dressing, using the toilet or eating?  Independent  Indoor Mobility: Did the patient need assistance with walking from room to room (with or without device)? Independent  Stairs: Did the patient need assistance with internal or external stairs (with or without device)? Independent  Functional Cognition: Did the patient need help planning regular tasks such as shopping or remembering to take medications? Independent  Home Assistive Devices / Equipment Home Assistive Devices/Equipment: None  Prior Device Use: Indicate devices/aids used by the patient prior to current illness, exacerbation or injury? None   Prior Functional Level Current Functional Level  Bed Mobility  Independent  Max assist   Transfers  Independent  Mod assist   Mobility -  Walk/Wheelchair  Independent  Total assist   Upper Body Dressing  Independent  Mod assist   Lower Body Dressing  Independent  Total assist   Grooming  Independent  Min assist   Eating/Drinking  Independent  Min assist   Toilet Transfer  Independent  Mod assist   Bladder Continence   WDL  Foley catheter   Bowel Management  WDL  Problems with constipation, abdomen swollen per wife   Stair Climbing  Independent Total assist   Communication  Intact  Intact   Memory  Decreased STM  Impaired   Cooking/Meal Prep  Wife does cooking      Housework  Wife does housework    Money Management  Independent    Driving  Yes, but wife drove most of the time.     Special needs/care consideration BiPAP/CPAP No CPM No Continuous Drip IV No Dialysis No      Life Vest No Oxygen No Special Bed: Air bed for pressure reduction Trach Size No Wound Vac (area) No      Skin Has a wound on inner right thigh area per wife                            Bowel mgmt: Problems with constipation and abdominal distention Bladder mgmt: Foley catheter Diabetic mgmt Yes, on insulin at home  Previous Home Environment Living Arrangements: Spouse/significant other  Lives With: Spouse Available Help at Discharge: Family, Available 24 hours/day Type of Home: House Home Layout: One level Home Access: Stairs to enter Secretary/administrator of Steps: small step Firefighter: Standard  Discharge Living Setting Plans for Discharge Living Setting: Patient's home, House, Lives with (comment) (Lives with wife.) Type of Home at Discharge: House Discharge Home Layout: One level Discharge Home Access: Stairs to enter Entrance Stairs-Number of Steps: 1 small step entry Does the patient have any problems obtaining your medications?: No  Social/Family/Support Systems Patient Roles: Spouse, Parent (Has a wife and 3 sons.) Contact Information: Pasquale Matters - wife Anticipated Caregiver:  Wife and sons Anticipated Caregiver's Contact Information: Damian Leavell - wife - [phone] Ability/Limitations of Caregiver: Wife currently works PT, but can take time off.  Son 22 yo not working.  Other 39 yo son can assist.  64 yo in college, but home now for summer can assist. Caregiver Availability: 24/7 Discharge Plan Discussed with Primary Caregiver: Yes Is Caregiver In Agreement with Plan?: Yes Does Caregiver/Family have Issues with Lodging/Transportation while Pt is in Rehab?: No  Goals/Additional Needs Patient/Family Goal for Rehab: PT/OT min to mod assist, ST mod I to supervision goals Expected length of stay: 15-22 days Cultural Considerations: None Dietary Needs: Dys 2, thin liquids.  SLP note says could upgrade to Dys 3 diet Equipment Needs: TBD Additional Information: Retired [employer]. Pt/Family  Agrees to Admission and willing to participate: Yes Program Orientation Provided & Reviewed with Pt/Caregiver Including Roles  & Responsibilities: Yes  Patient Condition:  Patient and wife were interviewed by me.  All records from [facility] were reviewed and results reviewed with rehab MD.  Patient will benefit from and is able to tolerate 3 hours of therapy a day.  He is currently receiving PT/OT/SLP therapies.  He is appropriate for inpatient rehab admission as he needs a coordinated approach to his rehab recovery care.  Wife and sons plan to care for patient at home after rehab discharge.  I have rehab MD approval for acute inpatient rehab admission for today.   Preadmission Screen Completed By:  Trish Mage, 04/26/2016 11:12 AM ______________________________________________________________________   Discussed status with Dr. Riley Kill on 04/26/16 at 1112 and received telephone approval for admission today.  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 Does the need for close, 24 hr/day  Medical supervision in concert with the patient's rehab needs make it unreasonable for this patient to be served in a less intensive setting? Yes 2. Co-Morbidities requiring supervision/potential complications: altered mental status, dm, seizure d/o, htn 3. Due to bladder management, bowel management, safety, skin/wound care, disease management, medication administration, pain management and patient education, does the patient require 24 hr/day rehab nursing? Yes 4. Does the patient require coordinated care of a physician, rehab nurse, PT (1-2 hrs/day, 5 days/week), OT (1-2 hrs/day, 5 days/week) and SLP (1-2 hrs/day, 5 days/week) to address physical and functional deficits in the context of the above medical diagnosis(es)? Yes Addressing deficits in the following areas: balance, endurance, locomotion, strength, transferring, bowel/bladder control, bathing, dressing, feeding, grooming, toileting, cognition and psychosocial support 5. Can the patient actively participate in an intensive therapy program of at least 3 hrs of therapy 5 days a week? Yes 6. The potential for patient to make measurable gains while on inpatient rehab is excellent 7. Anticipated functional outcomes upon discharge from inpatients are: min assist and mod assist PT, min assist and mod assist OT, supervision and min assist SLP 8. Estimated rehab length of stay to reach the above functional goals is: 16-22 days 9. Does the patient have adequate social supports to accommodate these discharge functional goals? Yes 10. Anticipated D/C setting: Home 11. Anticipated post D/C treatments: HH therapy and Outpatient therapy 12. Overall Rehab/Functional Prognosis: good    RECOMMENDATIONS: This patient's condition is appropriate for continued rehabilitative care in the following setting: CIR Patient has agreed to participate in recommended program. Yes Note that insurance prior  authorization may be required for reimbursement for recommended care.  Comment: admit to inpatient rehab today  Ranelle Oyster, MD, [facility] Physical Medicine & Rehabilitation 04/26/2016   Trish Mage 04/26/2016

## 2016-04-26 NOTE — H&P (View-Only) (Signed)
Physical Medicine and Rehabilitation Admission H&P    CC: left sided weakness, left facial droop, slurred speech, left field cut and cognitive deficits.    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  Conservative therapy as well as BP management of malignant BP recommended. He did have neurological decline and was taken to OR on 07/11 for emergent right crani for evacuation of hemorrhage by Dr. Jonathon Jordan. Post op he has had issues with elevated BP as well as anasarca due to fluid overload. He was treated with IV lasix and transferred to [facility] on 07/18  for therapy.   He continued to require IV diuresis and compression wraps for BLE edema. Foley was placed on 7/25 due to problems with retention. Lasix was changed to po and he was started on flomax. He was started on bowel regimen for abdominal pain with question of mild ileus. Medications have been adjusted for tighter blood pressure control, lantus was decreased due to hypoglycemic episodes and Zoloft added to help with depressive symptoms.  Foley and PICC discontinued prior to discharge to CIR.   Patient with resultant left sided weakness with confusion and lability, left sensory deficits with right gaze preference Therapy ongoing and he is showing improvement in endurance as well as improvement in ability to follow simple commands with cues. Chart reviewed by Rehab MD and patient felt to be CIR candidate.      Review of Systems  Unable to perform ROS: Mental acuity  HENT: Negative for hearing loss.   Eyes: Negative for blurred vision and double vision.  Respiratory: Negative for cough and shortness of breath.   Cardiovascular: Negative for chest pain and palpitations.  Gastrointestinal: Negative for constipation and heartburn.   Neurological: Negative for headaches.  Psychiatric/Behavioral: Positive for memory loss (STM deficits PTA ).  All other systems reviewed and are negative.     Past Medical History:  Diagnosis Date  . CHF (congestive heart failure) (HCC)   . CVA (cerebral infarction)   . Diabetes mellitus without complication (HCC)   . GERD (gastroesophageal reflux disease)   . Hypertension   . OSA (obstructive sleep apnea)   . Seizure disorder [facility])     Past Surgical History:  Procedure Laterality Date  . APPLICATION OF WOUND VAC Right 08/20/2013   Procedure: VAC ASSISTED CLOSURE FOREARM;  Surgeon: Dominica Severin, MD;  Location: [facility] OR;  Service: Orthopedics;  Laterality: Right;  . I&D EXTREMITY Right 08/18/2013   Procedure: IRRIGATION AND DEBRIDEMENT EXTREMITY;  Surgeon: Dominica Severin, MD;  Location: [facility] OR;  Service: Orthopedics;  Laterality: Right;  . I&D EXTREMITY Right 08/20/2013   Procedure: IRRIGATION AND DEBRIDEMENT WASHOUT ARM;  Surgeon: Dominica Severin, MD;  Location: [facility] OR;  Service: Orthopedics;  Laterality: Right;  . I&D EXTREMITY Right 08/22/2013   Procedure: IRRIGATION AND DEBRIDEMENT FOREARM COMPLEX WITH WOUND CLOSURE;  Surgeon: Dominica Severin, MD;  Location: [facility] OR;  Service: Orthopedics;  Laterality: Right;    No family history on file.    Social History:  Married. Retired from [employer]? He reports that he has been smoking--2-3 cigarettes daily.  He does not have any smokeless tobacco history on file. He reports drinks alcohol occasionally.  His drug history is not on file.  Allergies  Allergen Reactions  . Shellfish Allergy Anaphylaxis and Swelling    Medications Prior to Admission  Medication Sig Dispense Refill  . amLODipine (NORVASC) 10 MG tablet Take 1 tablet (10 mg total) by mouth daily. 30 tablet 0  . clindamycin (CLEOCIN) 150 MG capsule Take 450 mg by mouth 3 (three) times daily.    Marland Kitchen doxycycline (VIBRAMYCIN) 50 MG capsule Take 1 capsule (50 mg total) by mouth 2  (two) times daily. 42 capsule 0  . folic acid (FOLVITE) 1 MG tablet Take 1 mg by mouth daily.    Marland Kitchen glipiZIDE (GLUCOTROL) 10 MG tablet Take 10 mg by mouth 2 (two) times daily before a meal.    . insulin glargine (LANTUS) 100 UNIT/ML injection Inject 0.2 mLs (20 Units total) into the skin 2 (two) times daily. 10 mL 12  . insulin glargine (LANTUS) 100 UNIT/ML injection Inject 50 Units into the skin 2 (two) times daily.    Marland Kitchen lisinopril-hydrochlorothiazide (PRINZIDE,ZESTORETIC) 20-25 MG per tablet Take 1 tablet by mouth daily. 30 tablet 1  . lisinopril-hydrochlorothiazide (PRINZIDE,ZESTORETIC) 20-25 MG per tablet Take 1 tablet by mouth daily.    . metFORMIN (GLUCOPHAGE-XR) 500 MG 24 hr tablet Take 1,000 mg by mouth 2 (two) times daily.    . Multiple Vitamins-Minerals (MULTIVITAMIN WITH MINERALS) tablet Take 1 tablet by mouth daily.    Marland Kitchen omeprazole (PRILOSEC) 20 MG capsule Take 40 mg by mouth daily.    Marland Kitchen oxyCODONE (OXY IR/ROXICODONE) 5 MG immediate release tablet Take 1-2 tablets (5-10 mg total) by mouth every 3 (three) hours as needed for moderate pain. 45 tablet 0  . potassium chloride SA (K-DUR,KLOR-CON) 20 MEQ tablet Take 0.5 tablets (10 mEq total) by mouth daily. 20 tablet 0  . propranolol (INDERAL) 40 MG tablet Take 40 mg by mouth 2 (two) times daily.    Marland Kitchen thiamine (VITAMIN B-1) 100 MG tablet Take 100 mg by mouth daily.      Home: Home Living Living Arrangements: Spouse/significant other Available Help at Discharge: Family, Available 24 hours/day Type of Home: House Home Access: Stairs to enter [employer] of Steps: small step Home Layout: One level Firefighter: Standard  Lives With: Spouse   Functional History: Prior Function Level of Independence: Independent  Functional Status:  Mobility: Max assist to get to EOB Mod assist for transfers  Able to sit to stand  X 2 with +2 min to mod assist Able to tolerate sitting in chair for 2 hours    ADL: Max assist to  dependent for self feeding Require max assist to incorporate use of LUE  Cognition:      Physical Exam: Height 5\' 8"  (1.727 m), weight 117 kg (258 lb). Physical Exam  Nursing note and vitals reviewed. Constitutional: He appears well-developed and well-nourished.  HENT:  Head: Normocephalic and atraumatic.  Eyes: Conjunctivae and EOM are normal.  Neck: Normal range of motion. Neck supple. No JVD present. No tracheal deviation present. No thyromegaly present.  Cardiovascular: Normal rate and regular rhythm.   Respiratory: Effort normal and breath sounds normal. No respiratory distress. He has no wheezes. He has no rales.  GI: Soft. Bowel sounds are normal. He exhibits no distension. There is no tenderness.  Musculoskeletal: He exhibits edema (2+ pitting edema LLE).  Neurological: He is alert.  Confused--place "store" City - "[employer]".  Unable to answer biographic questions--kept calling for his absent wife to answer questions. Right gaze preference with eyes fixed to right--able to turn to midline with  max cues. Able to follow simple motor commands with cues.  Left inattention with decreased sensation. Has poor insight and lacks awareness of deficits.  Does move both LUE and LLE grossly 3 to 4/5 but very inconsistent. Senses pain when pinched. RUE and RLE 5/5  Skin: Skin is warm and dry. No rash noted. No erythema.  Psychiatric:  Anxious, impulsive    Results for orders placed or performed during the hospital encounter of 04/16/16 (from the past 48 hour(s))  Magnesium     Status: Abnormal   Collection Time: 04/26/16  7:16 AM  Result Value Ref Range   Magnesium 1.3 (L) 1.7 - 2.4 mg/dL   No results found.     Medical Problem List and Plan: 1.  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  -admit to inpatient rehab 2.  DVT Prophylaxis/Anticoagulation: Mechanical: Sequential compression  devices, below knee Bilateral lower extremities, lovenox 3. Pain Management: tylenol and percocet prn. Limit narcs as possible.  4. Mood: team will follow for ego support. Remains confused at present. Provide consistent routine to assist with anxiety 5. Neuropsych: This patient is not capable of making decisions on his own behalf. 6. Skin/Wound Care: local pressure relief, elevate lower ext  -optimize nutrition 7. Fluids/Electrolytes/Nutrition: encourage PO. Check admissions labs  -follow I's and O's daily 8. Urine retention: remove foley.  -encourage OOB to void/check PVR's, I and O caths prn  -check UA/CX 9. Fluid overload- lasix 60mg  BID  -daily weights  -follow I and O's 10. DM2- check cbg's QID  -lantus 15u bid  -SSI 11. HTN:   Continued elevation. Allow higher bp's for now.  -continue normodyne, hydralazine, lasix, catapres tabs,   -follow and adjust regimen as indicated with increased activity 12. Seizure proph: keppra     Post Admission Physician Evaluation: 1. Functional deficits secondary  to Right ICH. 2. Patient is admitted to receive collaborative, interdisciplinary care between the physiatrist, rehab nursing staff, and therapy team. 3. Patient's level of medical complexity and substantial therapy needs in context of that medical necessity cannot be provided at a lesser intensity of care such as a SNF. 4. Patient has experienced substantial functional loss from his/her baseline which was documented above under the "Functional History" and "Functional Status" headings.  Judging by the patient's diagnosis, physical exam, and functional history, the patient has potential for functional progress which will result in measurable gains while on inpatient rehab.  These gains will be of substantial and practical use upon discharge  in facilitating mobility and self-care at the household level. 5. Physiatrist will provide 24 hour management of medical needs as well as oversight of  the therapy plan/treatment and provide guidance as appropriate regarding the interaction of the two. 6. 24 hour rehab nursing will assist with bladder management, bowel management, safety, skin/wound care, disease management, medication administration, pain management and patient education  and help integrate therapy concepts, techniques,education, etc. 7. PT will assess and treat for/with: Lower extremity strength, range of motion, stamina, balance, functional mobility, safety, adaptive techniques and equipment, NMR, visual-spatial awarenes, cognition, family education.   Goals are: min to mod assist. 8. OT will assess and treat for/with: ADL's, functional mobility, safety, upper extremity strength, adaptive techniques and equipment, NMR, visual-spatial awareness, community reintegration, family ed.   Goals are: min assist to mod assist . Therapy may proceed with showering this patient. 9. SLP will assess and treat for/with: cognition, communication, education, behavior.  Goals are:  supervision to min assist. 10. Case Management and Social Worker will assess and treat for psychological issues and discharge planning. 11. Team conference will be held weekly to assess progress toward goals and to determine barriers to discharge. 12. Patient will receive at least 3 hours of therapy per day at least 5 days per week. 13. ELOS: 16-22 days       14. Prognosis:  excellent     Ranelle Oyster, MD, [facility] Physical Medicine & Rehabilitation 04/26/2016  04/26/2016

## 2016-04-26 NOTE — Progress Notes (Signed)
MEDICATION RELATED NOTE    Pharmacy Re:  Home Meds  Medications:  Prescriptions Prior to Admission  Medication Sig Dispense Refill Last Dose  . amLODipine (NORVASC) 10 MG tablet Take 1 tablet (10 mg total) by mouth daily. 30 tablet 0   . clindamycin (CLEOCIN) 150 MG capsule Take 450 mg by mouth 3 (three) times daily.   Past Week at Unknown time  . doxycycline (VIBRAMYCIN) 50 MG capsule Take 1 capsule (50 mg total) by mouth 2 (two) times daily. 42 capsule 0   . folic acid (FOLVITE) 1 MG tablet Take 1 mg by mouth daily.   08/20/2013 at Unknown time  . glipiZIDE (GLUCOTROL) 10 MG tablet Take 10 mg by mouth 2 (two) times daily before a meal.   Past Week at Unknown time  . insulin glargine (LANTUS) 100 UNIT/ML injection Inject 0.2 mLs (20 Units total) into the skin 2 (two) times daily. 10 mL 12   . insulin glargine (LANTUS) 100 UNIT/ML injection Inject 50 Units into the skin 2 (two) times daily.   Past Week at Unknown time  . lisinopril-hydrochlorothiazide (PRINZIDE,ZESTORETIC) 20-25 MG per tablet Take 1 tablet by mouth daily. 30 tablet 1   . lisinopril-hydrochlorothiazide (PRINZIDE,ZESTORETIC) 20-25 MG per tablet Take 1 tablet by mouth daily.   Past Week at Unknown time  . metFORMIN (GLUCOPHAGE-XR) 500 MG 24 hr tablet Take 1,000 mg by mouth 2 (two) times daily.   08/20/2013 at Unknown time  . Multiple Vitamins-Minerals (MULTIVITAMIN WITH MINERALS) tablet Take 1 tablet by mouth daily.   08/20/2013 at Unknown time  . omeprazole (PRILOSEC) 20 MG capsule Take 40 mg by mouth daily.   Past Week at Unknown time  . oxyCODONE (OXY IR/ROXICODONE) 5 MG immediate release tablet Take 1-2 tablets (5-10 mg total) by mouth every 3 (three) hours as needed for moderate pain. 45 tablet 0   . potassium chloride SA (K-DUR,KLOR-CON) 20 MEQ tablet Take 0.5 tablets (10 mEq total) by mouth daily. 20 tablet 0   . propranolol (INDERAL) 40 MG tablet Take 40 mg by mouth 2 (two) times daily.   08/20/2013 at Unknown time  .  thiamine (VITAMIN B-1) 100 MG tablet Take 100 mg by mouth daily.   Past Week at Unknown time    Assessment: Patient admitted from [facility] with multiple medications on file.  Prior to this, he had meds from [facility] which can be found in their summary.  It is unclear what medications he may have been receiving at home.  Last doses cannot be determined.  Please review the above list of medications carefully and resume those you feel appropriate for his care.  Plan:  I have marked his medication history as complete.  Should you obtain new information and we can assist in his medication history update, please let us know.  Nadara Mustard, PharmD., MS Clinical Pharmacist Pager:  [phone] Thank you for allowing pharmacy to be part of this patients care team. 04/26/2016,3:09 PM

## 2016-04-26 NOTE — H&P (Signed)
Physical Medicine and Rehabilitation Admission H&P    CC: left sided weakness, left facial droop, slurred speech, left field cut and cognitive deficits.    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  Conservative therapy as well as BP management of malignant BP recommended. He did have neurological decline and was taken to OR on 07/11 for emergent right crani for evacuation of hemorrhage by Dr. Jonathon Jordan. Post op he has had issues with elevated BP as well as anasarca due to fluid overload. He was treated with IV lasix and transferred to [facility] on 07/18  for therapy.   He continued to require IV diuresis and compression wraps for BLE edema. Foley was placed on 7/25 due to problems with retention. Lasix was changed to po and he was started on flomax. He was started on bowel regimen for abdominal pain with question of mild ileus. Medications have been adjusted for tighter blood pressure control, lantus was decreased due to hypoglycemic episodes and Zoloft added to help with depressive symptoms.  Foley and PICC discontinued prior to discharge to CIR.   Patient with resultant left sided weakness with confusion and lability, left sensory deficits with right gaze preference Therapy ongoing and he is showing improvement in endurance as well as improvement in ability to follow simple commands with cues. Chart reviewed by Rehab MD and patient felt to be CIR candidate.      Review of Systems  Unable to perform ROS: Mental acuity  HENT: Negative for hearing loss.   Eyes: Negative for blurred vision and double vision.  Respiratory: Negative for cough and shortness of breath.   Cardiovascular: Negative for chest pain and palpitations.  Gastrointestinal: Negative for constipation and heartburn.   Neurological: Negative for headaches.  Psychiatric/Behavioral: Positive for memory loss (STM deficits PTA ).  All other systems reviewed and are negative.     Past Medical History:  Diagnosis Date  . CHF (congestive heart failure) (HCC)   . CVA (cerebral infarction)   . Diabetes mellitus without complication (HCC)   . GERD (gastroesophageal reflux disease)   . Hypertension   . OSA (obstructive sleep apnea)   . Seizure disorder [facility])     Past Surgical History:  Procedure Laterality Date  . APPLICATION OF WOUND VAC Right 08/20/2013   Procedure: VAC ASSISTED CLOSURE FOREARM;  Surgeon: Dominica Severin, MD;  Location: [facility] OR;  Service: Orthopedics;  Laterality: Right;  . I&D EXTREMITY Right 08/18/2013   Procedure: IRRIGATION AND DEBRIDEMENT EXTREMITY;  Surgeon: Dominica Severin, MD;  Location: [facility] OR;  Service: Orthopedics;  Laterality: Right;  . I&D EXTREMITY Right 08/20/2013   Procedure: IRRIGATION AND DEBRIDEMENT WASHOUT ARM;  Surgeon: Dominica Severin, MD;  Location: [facility] OR;  Service: Orthopedics;  Laterality: Right;  . I&D EXTREMITY Right 08/22/2013   Procedure: IRRIGATION AND DEBRIDEMENT FOREARM COMPLEX WITH WOUND CLOSURE;  Surgeon: Dominica Severin, MD;  Location: [facility] OR;  Service: Orthopedics;  Laterality: Right;    No family history on file.    Social History:  Married. Retired from [employer]? He reports that he has been smoking--2-3 cigarettes daily.  He does not have any smokeless tobacco history on file. He reports drinks alcohol occasionally.  His drug history is not on file.  Allergies  Allergen Reactions  . Shellfish Allergy Anaphylaxis and Swelling    Medications Prior to Admission  Medication Sig Dispense Refill  . amLODipine (NORVASC) 10 MG tablet Take 1 tablet (10 mg total) by mouth daily. 30 tablet 0  . clindamycin (CLEOCIN) 150 MG capsule Take 450 mg by mouth 3 (three) times daily.    Marland Kitchen doxycycline (VIBRAMYCIN) 50 MG capsule Take 1 capsule (50 mg total) by mouth 2  (two) times daily. 42 capsule 0  . folic acid (FOLVITE) 1 MG tablet Take 1 mg by mouth daily.    Marland Kitchen glipiZIDE (GLUCOTROL) 10 MG tablet Take 10 mg by mouth 2 (two) times daily before a meal.    . insulin glargine (LANTUS) 100 UNIT/ML injection Inject 0.2 mLs (20 Units total) into the skin 2 (two) times daily. 10 mL 12  . insulin glargine (LANTUS) 100 UNIT/ML injection Inject 50 Units into the skin 2 (two) times daily.    Marland Kitchen lisinopril-hydrochlorothiazide (PRINZIDE,ZESTORETIC) 20-25 MG per tablet Take 1 tablet by mouth daily. 30 tablet 1  . lisinopril-hydrochlorothiazide (PRINZIDE,ZESTORETIC) 20-25 MG per tablet Take 1 tablet by mouth daily.    . metFORMIN (GLUCOPHAGE-XR) 500 MG 24 hr tablet Take 1,000 mg by mouth 2 (two) times daily.    . Multiple Vitamins-Minerals (MULTIVITAMIN WITH MINERALS) tablet Take 1 tablet by mouth daily.    Marland Kitchen omeprazole (PRILOSEC) 20 MG capsule Take 40 mg by mouth daily.    Marland Kitchen oxyCODONE (OXY IR/ROXICODONE) 5 MG immediate release tablet Take 1-2 tablets (5-10 mg total) by mouth every 3 (three) hours as needed for moderate pain. 45 tablet 0  . potassium chloride SA (K-DUR,KLOR-CON) 20 MEQ tablet Take 0.5 tablets (10 mEq total) by mouth daily. 20 tablet 0  . propranolol (INDERAL) 40 MG tablet Take 40 mg by mouth 2 (two) times daily.    Marland Kitchen thiamine (VITAMIN B-1) 100 MG tablet Take 100 mg by mouth daily.      Home: Home Living Living Arrangements: Spouse/significant other Available Help at Discharge: Family, Available 24 hours/day Type of Home: House Home Access: Stairs to enter [employer] of Steps: small step Home Layout: One level Firefighter: Standard  Lives With: Spouse   Functional History: Prior Function Level of Independence: Independent  Functional Status:  Mobility: Max assist to get to EOB Mod assist for transfers  Able to sit to stand  X 2 with +2 min to mod assist Able to tolerate sitting in chair for 2 hours    ADL: Max assist to  dependent for self feeding Require max assist to incorporate use of LUE  Cognition:      Physical Exam: Height 5\' 8"  (1.727 m), weight 117 kg (258 lb). Physical Exam  Nursing note and vitals reviewed. Constitutional: He appears well-developed and well-nourished.  HENT:  Head: Normocephalic and atraumatic.  Eyes: Conjunctivae and EOM are normal.  Neck: Normal range of motion. Neck supple. No JVD present. No tracheal deviation present. No thyromegaly present.  Cardiovascular: Normal rate and regular rhythm.   Respiratory: Effort normal and breath sounds normal. No respiratory distress. He has no wheezes. He has no rales.  GI: Soft. Bowel sounds are normal. He exhibits no distension. There is no tenderness.  Musculoskeletal: He exhibits edema (2+ pitting edema LLE).  Neurological: He is alert.  Confused--place "store" City - "[employer]".  Unable to answer biographic questions--kept calling for his absent wife to answer questions. Right gaze preference with eyes fixed to right--able to turn to midline with  max cues. Able to follow simple motor commands with cues.  Left inattention with decreased sensation. Has poor insight and lacks awareness of deficits.  Does move both LUE and LLE grossly 3 to 4/5 but very inconsistent. Senses pain when pinched. RUE and RLE 5/5  Skin: Skin is warm and dry. No rash noted. No erythema.  Psychiatric:  Anxious, impulsive    Results for orders placed or performed during the hospital encounter of 04/16/16 (from the past 48 hour(s))  Magnesium     Status: Abnormal   Collection Time: 04/26/16  7:16 AM  Result Value Ref Range   Magnesium 1.3 (L) 1.7 - 2.4 mg/dL   No results found.     Medical Problem List and Plan: 1.  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  -admit to inpatient rehab 2.  DVT Prophylaxis/Anticoagulation: Mechanical: Sequential compression  devices, below knee Bilateral lower extremities, lovenox 3. Pain Management: tylenol and percocet prn. Limit narcs as possible.  4. Mood: team will follow for ego support. Remains confused at present. Provide consistent routine to assist with anxiety 5. Neuropsych: This patient is not capable of making decisions on his own behalf. 6. Skin/Wound Care: local pressure relief, elevate lower ext  -optimize nutrition 7. Fluids/Electrolytes/Nutrition: encourage PO. Check admissions labs  -follow I's and O's daily 8. Urine retention: remove foley.  -encourage OOB to void/check PVR's, I and O caths prn  -check UA/CX 9. Fluid overload- lasix 60mg  BID  -daily weights  -follow I and O's 10. DM2- check cbg's QID  -lantus 15u bid  -SSI 11. HTN:   Continued elevation. Allow higher bp's for now.  -continue normodyne, hydralazine, lasix, catapres tabs,   -follow and adjust regimen as indicated with increased activity 12. Seizure proph: keppra     Post Admission Physician Evaluation: 1. Functional deficits secondary  to Right ICH. 2. Patient is admitted to receive collaborative, interdisciplinary care between the physiatrist, rehab nursing staff, and therapy team. 3. Patient's level of medical complexity and substantial therapy needs in context of that medical necessity cannot be provided at a lesser intensity of care such as a SNF. 4. Patient has experienced substantial functional loss from his/her baseline which was documented above under the "Functional History" and "Functional Status" headings.  Judging by the patient's diagnosis, physical exam, and functional history, the patient has potential for functional progress which will result in measurable gains while on inpatient rehab.  These gains will be of substantial and practical use upon discharge  in facilitating mobility and self-care at the household level. 5. Physiatrist will provide 24 hour management of medical needs as well as oversight of  the therapy plan/treatment and provide guidance as appropriate regarding the interaction of the two. 6. 24 hour rehab nursing will assist with bladder management, bowel management, safety, skin/wound care, disease management, medication administration, pain management and patient education  and help integrate therapy concepts, techniques,education, etc. 7. PT will assess and treat for/with: Lower extremity strength, range of motion, stamina, balance, functional mobility, safety, adaptive techniques and equipment, NMR, visual-spatial awarenes, cognition, family education.   Goals are: min to mod assist. 8. OT will assess and treat for/with: ADL's, functional mobility, safety, upper extremity strength, adaptive techniques and equipment, NMR, visual-spatial awareness, community reintegration, family ed.   Goals are: min assist to mod assist . Therapy may proceed with showering this patient. 9. SLP will assess and treat for/with: cognition, communication, education, behavior.  Goals are:  supervision to min assist. 10. Case Management and Social Worker will assess and treat for psychological issues and discharge planning. 11. Team conference will be held weekly to assess progress toward goals and to determine barriers to discharge. 12. Patient will receive at least 3 hours of therapy per day at least 5 days per week. 13. ELOS: 16-22 days       14. Prognosis:  excellent     Ranelle Oyster, MD, [facility] Physical Medicine & Rehabilitation 04/26/2016  04/26/2016

## 2016-04-26 NOTE — Progress Notes (Addendum)
Ernest [name] is a 60 y.o. male patient admitted from [facility] awake, alert - oriented  X1 - no acute distress noted.  VSS - Blood pressure (!) 170/90, pulse 81, temperature 99.2 F (37.3 C), temperature source Oral, resp. rate 16, height 5\' 9"  (1.753 m), weight 105.2 kg (232 lb), SpO2 99 %.    IV in place, occlusive dsg intact without redness.  Orientation to room, and floor completed with information packet given to patient/family.  Patient declined safety video at this time.  Admission INP armband ID verified with patient/family, and in place.   SR up x 2, fall assessment complete, with patient and family able to verbalize understanding of risk associated with falls, and verbalized understanding to call nsg before up out of bed.  Call light within reach, patient able to voice, and demonstrate understanding.    PA made aware of patient's HTN and stated she would place orders.    Will cont to eval and treat per MD orders.  Kendall Flack, RN 04/26/2016 2:45 PM

## 2016-04-27 ENCOUNTER — Inpatient Hospital Stay (HOSPITAL_COMMUNITY): Payer: Medicare Other | Admitting: Physical Therapy

## 2016-04-27 ENCOUNTER — Inpatient Hospital Stay (HOSPITAL_COMMUNITY): Payer: Medicare Other

## 2016-04-27 ENCOUNTER — Inpatient Hospital Stay (HOSPITAL_COMMUNITY): Payer: Medicare Other | Admitting: Speech Pathology

## 2016-04-27 LAB — COMPREHENSIVE METABOLIC PANEL
ALK PHOS: 185 U/L — AB (ref 38–126)
ALT: 40 U/L (ref 17–63)
AST: 64 U/L — ABNORMAL HIGH (ref 15–41)
Albumin: 2 g/dL — ABNORMAL LOW (ref 3.5–5.0)
Anion gap: 9 (ref 5–15)
BILIRUBIN TOTAL: 0.7 mg/dL (ref 0.3–1.2)
BUN: 12 mg/dL (ref 6–20)
CALCIUM: 8.3 mg/dL — AB (ref 8.9–10.3)
CHLORIDE: 97 mmol/L — AB (ref 101–111)
CO2: 27 mmol/L (ref 22–32)
CREATININE: 0.97 mg/dL (ref 0.61–1.24)
Glucose, Bld: 146 mg/dL — ABNORMAL HIGH (ref 65–99)
Potassium: 2.9 mmol/L — ABNORMAL LOW (ref 3.5–5.1)
Sodium: 133 mmol/L — ABNORMAL LOW (ref 135–145)
TOTAL PROTEIN: 5.9 g/dL — AB (ref 6.5–8.1)

## 2016-04-27 LAB — CBC WITH DIFFERENTIAL/PLATELET
BASOS ABS: 0 10*3/uL (ref 0.0–0.1)
BASOS PCT: 0 %
EOS ABS: 0.2 10*3/uL (ref 0.0–0.7)
EOS PCT: 5 %
HCT: 32.1 % — ABNORMAL LOW (ref 39.0–52.0)
HEMOGLOBIN: 10.6 g/dL — AB (ref 13.0–17.0)
Lymphocytes Relative: 34 %
Lymphs Abs: 1.5 10*3/uL (ref 0.7–4.0)
MCH: 29.3 pg (ref 26.0–34.0)
MCHC: 33 g/dL (ref 30.0–36.0)
MCV: 88.7 fL (ref 78.0–100.0)
Monocytes Absolute: 0.5 10*3/uL (ref 0.1–1.0)
Monocytes Relative: 12 %
NEUTROS PCT: 49 %
Neutro Abs: 2.1 10*3/uL (ref 1.7–7.7)
PLATELETS: 147 10*3/uL — AB (ref 150–400)
RBC: 3.62 MIL/uL — AB (ref 4.22–5.81)
RDW: 12.2 % (ref 11.5–15.5)
WBC: 4.3 10*3/uL (ref 4.0–10.5)

## 2016-04-27 LAB — URINALYSIS, ROUTINE W REFLEX MICROSCOPIC
Bilirubin Urine: NEGATIVE
Glucose, UA: NEGATIVE mg/dL
KETONES UR: NEGATIVE mg/dL
Nitrite: NEGATIVE
Specific Gravity, Urine: 1.014 (ref 1.005–1.030)
pH: 8 (ref 5.0–8.0)

## 2016-04-27 LAB — GLUCOSE, CAPILLARY
GLUCOSE-CAPILLARY: 176 mg/dL — AB (ref 65–99)
GLUCOSE-CAPILLARY: 225 mg/dL — AB (ref 65–99)
Glucose-Capillary: 177 mg/dL — ABNORMAL HIGH (ref 65–99)
Glucose-Capillary: 242 mg/dL — ABNORMAL HIGH (ref 65–99)

## 2016-04-27 LAB — URINE MICROSCOPIC-ADD ON

## 2016-04-27 LAB — MRSA PCR SCREENING: MRSA by PCR: NEGATIVE

## 2016-04-27 MED ORDER — LEVETIRACETAM 500 MG PO TABS
500.0000 mg | ORAL_TABLET | Freq: Two times a day (BID) | ORAL | Status: DC
  Administered 2016-04-27 – 2016-05-16 (×36): 500 mg via ORAL
  Filled 2016-04-27 (×37): qty 1

## 2016-04-27 MED ORDER — POTASSIUM CHLORIDE CRYS ER 20 MEQ PO TBCR
40.0000 meq | EXTENDED_RELEASE_TABLET | Freq: Three times a day (TID) | ORAL | Status: DC
Start: 2016-04-27 — End: 2016-04-28
  Administered 2016-04-27 – 2016-04-28 (×5): 40 meq via ORAL
  Filled 2016-04-27 (×5): qty 2

## 2016-04-27 MED ORDER — IPRATROPIUM-ALBUTEROL 0.5-2.5 (3) MG/3ML IN SOLN: 3.0000 mL | Freq: Four times a day (QID) | RESPIRATORY_TRACT | Status: DC | PRN

## 2016-04-27 MED ORDER — FUROSEMIDE 40 MG PO TABS
60.0000 mg | ORAL_TABLET | Freq: Two times a day (BID) | ORAL | Status: DC
  Administered 2016-04-27 – 2016-05-06 (×20): 60 mg via ORAL
  Filled 2016-04-27 (×20): qty 1

## 2016-04-27 MED ORDER — ENOXAPARIN SODIUM 40 MG/0.4ML ~~LOC~~ SOLN
40.0000 mg | SUBCUTANEOUS | Status: DC
  Administered 2016-04-27 – 2016-05-15 (×18): 40 mg via SUBCUTANEOUS
  Filled 2016-04-27 (×18): qty 0.4

## 2016-04-27 NOTE — Progress Notes (Signed)
[place] PHYSICAL MEDICINE & REHABILITATION     PROGRESS NOTE    Subjective/Complaints: Uneventful night. Still with confusion. Mild headache.   ROS limited by mental status  Objective: Vital Signs: Blood pressure (!) 144/72, pulse 71, temperature 98.3 F (36.8 C), temperature source Oral, resp. rate 18, height 5\' 9"  (1.753 m), weight 105.2 kg (232 lb), SpO2 98 %. No results found.  Recent Labs  04/27/16 0536  WBC 4.3  HGB 10.6*  HCT 32.1*  PLT 147*    Recent Labs  04/26/16 1532 04/27/16 0536  NA  --  133*  K  --  2.9*  CL  --  97*  GLUCOSE  --  146*  BUN  --  12  CREATININE 0.98 0.97  CALCIUM  --  8.3*   CBG (last 3)   Recent Labs  04/26/16 1703 04/26/16 2038 04/27/16 0649  GLUCAP 133* 196* 176*    Wt Readings from Last 3 Encounters:  04/26/16 105.2 kg (232 lb)  04/26/16 117 kg (258 lb)  08/18/13 89.1 kg (196 lb 6.4 oz)    Physical Exam:  Constitutional: He appears well-developed and well-nourished.  HENT:  Head: Normocephalic and atraumatic.  Eyes: Conjunctivae and EOM are normal.  Neck: Normal range of motion. Neck supple. No JVD present. No tracheal deviation present. No thyromegaly present.  Cardiovascular: Normal rate and regular rhythm.   Respiratory: Effort normal and breath sounds normal. No respiratory distress. He has no wheezes. He has no rales.  GI: Soft. Bowel sounds are normal. He exhibits no distension. There is no tenderness.  Musculoskeletal: He exhibits edema (2+ pitting edema LLE).  Neurological: He is alert.  Still confused. Concentration a little better today. Right gaze preference with eyes fixed to right--able to turn to midline with max cues. Able to follow simple motor commands with cues.  Left inattention with decreased sensation. Has poor insight and lacks awareness of deficits.  Does move both LUE and LLE grossly 3 to 4/5 but very inconsistent. Senses pain when pinched. RUE and RLE 5/5  Skin: Skin is warm and dry. No  rash noted. No erythema.  Psychiatric:  Anxious, impulsive   Assessment/Plan: 1. Cognitive, mobility, and visual-spatial deficits secondary to right parietal-temporal ICH which require 3+ hours per day of interdisciplinary therapy in a comprehensive inpatient rehab setting. Physiatrist is providing close team supervision and 24 hour management of active medical problems listed below. Physiatrist and rehab team continue to assess barriers to discharge/monitor patient progress toward functional and medical goals.  Function:  Bathing Bathing position      Bathing parts      Bathing assist        Upper Body Dressing/Undressing Upper body dressing                    Upper body assist        Lower Body Dressing/Undressing Lower body dressing                                  Lower body assist        Toileting Toileting          Toileting assist     Transfers Chair/bed Optician, dispensing          Cognition Comprehension  Expression    Social Interaction    Problem Solving    Memory     Medical Problem List and Plan: 1.  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                       -begin therapies today 2.  DVT Prophylaxis/Anticoagulation: Mechanical: Sequential compression devices, below knee Bilateral lower extremities, lovenox 40 qd 3. Pain Management: tylenol and percocet prn. Limit narcs as possible.  4. Mood: team will follow for ego support. Remains confused at present. Provide consistent routine to assist with anxiety 5. Neuropsych: This patient is not capable of making decisions on his own behalf. 6. Skin/Wound Care: local pressure relief, elevate lower ext                       -optimize nutrition 7. Fluids/Electrolytes/Nutrition:   -I personally reviewed the patient's labs today.    -replete K+              -follow I's and O's daily 8. Urine retention: remove foley.                       -encourage OOB to void/check PVR's, I and O caths prn                       -check UA/CX 9. Fluid overload- lasix  BID--adjust regimen as indicated                       -daily weights                       -follow I and O's 10. DM2- check cbg's QID                       -lantus 15u bid                       -SSI   -follow for pattern 11. HTN:   fair control at present                       -continue normodyne, hydralazine, lasix, catapres tabs,                        -follow and adjust regimen as indicated with increased activity 12. Seizure proph: keppra  bid  LOS (Days) 1 A FACE TO FACE EVALUATION WAS PERFORMED  Macee Venables T 04/27/2016 8:23 AM

## 2016-04-27 NOTE — Evaluation (Signed)
Physical Therapy Assessment and Plan  Patient Details  Name: Ernest [name] MRN: [id number] Date of Birth: 09-Feb-1956  PT Diagnosis: Abnormality of gait, Cognitive deficits, Difficulty walking, Hemiplegia non-dominant, Impaired cognition, Impaired sensation and Muscle weakness Rehab Potential: Good ELOS: 16-20 days   Today's Date: 04/27/2016 PT Individual Time: 1000-1058 PT Individual Time Calculation (min): 58 min     Problem List: Patient Active Problem List   Diagnosis Date Noted  . Nontraumatic cortical hemorrhage of right cerebral hemisphere ([place]) 04/26/2016  . 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 Abscess of right arm 08/18/2013  . Essential hypertension 08/18/2013  . Poorly controlled type 2 diabetes mellitus ([place]) 08/18/2013  . Thrombocytopenia, unspecified ([place]) 08/18/2013    Past Medical History:  Past Medical History:  Diagnosis Date  . CHF (congestive heart failure) ([place])   . CVA (cerebral infarction)   . Diabetes mellitus without complication ([place])   . GERD (gastroesophageal reflux disease)   . Hypertension   . OSA (obstructive sleep apnea)   . Seizure disorder [facility])    Past Surgical History:  Past Surgical History:  Procedure Laterality Date  . APPLICATION OF WOUND VAC Right 08/20/2013   Procedure: VAC ASSISTED CLOSURE FOREARM;  Surgeon: Roseanne Kaufman, MD;  Location: [place];  Service: Orthopedics;  Laterality: Right;  . I&D EXTREMITY Right 08/18/2013   Procedure: IRRIGATION AND DEBRIDEMENT EXTREMITY;  Surgeon: Roseanne Kaufman, MD;  Location: [place];  Service: Orthopedics;  Laterality: Right;  . I&D EXTREMITY Right 08/20/2013   Procedure: IRRIGATION AND DEBRIDEMENT WASHOUT ARM;  Surgeon: Roseanne Kaufman, MD;  Location: [place];  Service: Orthopedics;  Laterality: Right;  . I&D EXTREMITY Right 08/22/2013   Procedure: IRRIGATION AND DEBRIDEMENT FOREARM COMPLEX WITH WOUND CLOSURE;  Surgeon: Roseanne Kaufman, MD;  Location: [place];  Service: Orthopedics;  Laterality: Right;     Assessment & Plan Clinical Impression: Patient is a 60 y.o. year old male with recent admission to the hospital on 04/08/16 with left facial droop and left sided weakness due to large acute IPH involving right parietal and temporal region with possible IVH.  Conservative therapy as well as BP management of malignant BP recommended. He did have neurological decline and was taken to OR on 07/11 for emergent right crani for evacuation of hemorrhage by Dr. Radonna Ricker. Post op he has had issues with elevated BP as well as anasarca due to fluid overload. He was treated with IV lasix and transferred to [facility] on 07/18  for therapy.  Patient transferred to CIR on 04/26/2016 .   Patient currently requires max with mobility secondary to muscle weakness, abnormal tone, unbalanced muscle activation, motor apraxia and decreased coordination, field cut, decreased attention, decreased awareness, decreased problem solving, decreased safety awareness, decreased memory and delayed processing and decreased standing balance, decreased postural control, hemiplegia and decreased balance strategies.  Prior to hospitalization, patient was independent  with mobility and lived with Spouse in a House home.  Home access is small stepStairs to enter.  Patient will benefit from skilled PT intervention to maximize safe functional mobility, minimize fall risk and decrease caregiver burden for planned discharge home with 24 hour assist.  Anticipate patient will benefit from follow up [facility] at discharge.  PT - End of Session Activity Tolerance: Tolerates 30+ min activity with multiple rests Endurance Deficit: Yes PT Assessment Rehab Potential (ACUTE/IP ONLY): Good PT Patient demonstrates impairments in the following area(s): Balance;Edema;Endurance;Motor;Pain;Safety;Sensory PT Transfers Functional Problem(s): Bed Mobility;Bed to Chair;Car;Furniture PT Locomotion Functional Problem(s): Ambulation;Wheelchair Mobility;Stairs PT Plan PT  Intensity:  Minimum of 1-2 x/day ,45 to 90 minutes PT Frequency: 5 out of 7 days PT Duration Estimated Length of Stay: 16-20 days PT Treatment/Interventions: Ambulation/gait training;Community reintegration;DME/adaptive equipment instruction;Neuromuscular re-education;Stair training;UE/LE Strength taining/ROM;Wheelchair propulsion/positioning;UE/LE Coordination activities;Therapeutic Activities;Pain management;Functional electrical stimulation;Discharge planning;Balance/vestibular training;Cognitive remediation/compensation;Functional mobility training;Patient/family education;Splinting/orthotics;Therapeutic Exercise PT Transfers Anticipated Outcome(s): min A PT Locomotion Anticipated Outcome(s): min A gait PT Recommendation Follow Up Recommendations: Home health PT Patient destination: Home Equipment Recommended: To be determined  Skilled Therapeutic Intervention Pt performed sit to stand multiple attempts with mod A to stand, mod/max A for standing balance without device as pt tends to lose balance to Rt and posteriorly.  Gait with +2 HHA with pt able to advance L LE, requires cues for wt shifts and due to visual impairments requires cues for avoiding obstacles.  Gait with RW with +2 assist for safety, mod A for wt shifts, max A to control RW due to left inattention and field cut.  Standing step ups to 4'' step multiple attempts for strength and balance with mod A to ascend, max A to descend. Pt performed car transfer to simulated sedan with mod A, max cuing as transfer into car was to the left.  Pt with little recall of information during session, decreased awareness of deficits, impaired attention to task and decreased attention to Lt side of body.  PT Evaluation Precautions/Restrictions Precautions Precautions: Fall Precaution Comments: Swallowing precautions Restrictions Weight Bearing Restrictions: No Pain Pain Assessment Pain Assessment: No/denies pain  Home Living/Prior  Functioning Home Living Available Help at Discharge: Family;Available 24 hours/day Type of Home: House Home Access: Stairs to enter [employer] of Steps: small step Home Layout: One level Bathroom Shower/Tub: Walk-in shower;Tub/shower unit Armed forces training and education officer: Yes  Lives With: Spouse Prior Function Level of Independence: Independent with basic ADLs;Independent with transfers;Independent with gait Driving: Yes Vocation: On disability Leisure: Hobbies-yes (Comment) Comments: watch sports, basketball  Cognition Overall Cognitive Status: Impaired/Different from baseline Arousal/Alertness: Awake/alert Orientation Level: Oriented to person;Disoriented to place;Disoriented to time;Disoriented to situation Attention: Sustained Sustained Attention: Appears intact Sustained Attention Impairment: Functional basic Memory: Impaired Memory Impairment: Decreased recall of new information;Decreased short term memory Awareness: Impaired Awareness Impairment: Intellectual impairment Problem Solving: Impaired Problem Solving Impairment: Verbal basic;Functional basic Behaviors: Perseveration Safety/Judgment: Impaired Comments: perseverative on finding out what time his wife will get to the hospital Sensation Sensation Light Touch: Impaired Detail Light Touch Impaired Details: Impaired LUE;Impaired LLE Proprioception: Impaired Detail Proprioception Impaired Details: Impaired LUE;Impaired LLE Coordination Gross Motor Movements are Fluid and Coordinated: No Fine Motor Movements are Fluid and Coordinated: No Coordination and Movement Description: LUE apraxia Finger Nose Finger Test: unable to complete with LUE due to weakness and apraxia Motor  Motor Motor: Hemiplegia;Abnormal tone;Motor apraxia   Trunk/Postural Assessment  Cervical Assessment Cervical Assessment:  (forward flexed) Thoracic Assessment Thoracic Assessment:  (kyphosis) Lumbar  Assessment Lumbar Assessment:  (posterior pelvic tilt) Postural Control Postural Control: Deficits on evaluation Righting Reactions: delayed  Balance Balance Balance Assessed: Yes Static Sitting Balance Static Sitting - Balance Support: Feet supported;Bilateral upper extremity supported Static Sitting - Level of Assistance: 5: Stand by assistance Dynamic Sitting Balance Dynamic Sitting - Balance Support: During functional activity;Feet supported (posterior LOB) Dynamic Sitting - Level of Assistance: 5: Stand by assistance Static Standing Balance Static Standing - Balance Support: Bilateral upper extremity supported Static Standing - Level of Assistance: 3: Mod assist Extremity Assessment   RLE Assessment RLE Assessment: Within Functional Limits LLE Assessment LLE Assessment:  (3-/5 knee ext, hip  flex, 2+/5 knee flex, 0/5 DF/PF)   See Function Navigator for Current Functional Status.   Refer to Care Plan for Long Term Goals  Recommendations for other services: None  Discharge Criteria: Patient will be discharged from PT if patient refuses treatment 3 consecutive times without medical reason, if treatment goals not met, if there is a change in medical status, if patient makes no progress towards goals or if patient is discharged from hospital.  The above assessment, treatment plan, treatment alternatives and goals were discussed and mutually agreed upon: by patient  , 04/27/2016, 11:03 AM

## 2016-04-27 NOTE — Evaluation (Signed)
Occupational Therapy Assessment and Plan  Patient Details  Name: Ernest [name] MRN: [id number] Date of Birth: 12-25-1955  OT Diagnosis: abnormal posture, apraxia, cognitive deficits, disturbance of vision, hemiplegia affecting non-dominant side and muscle weakness (generalized) Rehab Potential: Rehab Potential (ACUTE ONLY): Good ELOS: 16-22 days   Today's Date: 04/27/2016 OT Individual Time: 9798-9211 OT Individual Time Calculation (min): 75 min      Problem List: Patient Active Problem List   Diagnosis Date Noted  . Nontraumatic cortical hemorrhage of right cerebral hemisphere ([place]) 04/26/2016  . 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 Abscess of right arm 08/18/2013  . Essential hypertension 08/18/2013  . Poorly controlled type 2 diabetes mellitus ([place]) 08/18/2013  . Thrombocytopenia, unspecified ([place]) 08/18/2013    Past Medical History:  Past Medical History:  Diagnosis Date  . CHF (congestive heart failure) ([place])   . CVA (cerebral infarction)   . Diabetes mellitus without complication ([place])   . GERD (gastroesophageal reflux disease)   . Hypertension   . OSA (obstructive sleep apnea)   . Seizure disorder [facility])    Past Surgical History:  Past Surgical History:  Procedure Laterality Date  . APPLICATION OF WOUND VAC Right 08/20/2013   Procedure: VAC ASSISTED CLOSURE FOREARM;  Surgeon: Roseanne Kaufman, MD;  Location: [place];  Service: Orthopedics;  Laterality: Right;  . I&D EXTREMITY Right 08/18/2013   Procedure: IRRIGATION AND DEBRIDEMENT EXTREMITY;  Surgeon: Roseanne Kaufman, MD;  Location: [place];  Service: Orthopedics;  Laterality: Right;  . I&D EXTREMITY Right 08/20/2013   Procedure: IRRIGATION AND DEBRIDEMENT WASHOUT ARM;  Surgeon: Roseanne Kaufman, MD;  Location: [place];  Service: Orthopedics;  Laterality: Right;  . I&D EXTREMITY Right 08/22/2013   Procedure: IRRIGATION AND DEBRIDEMENT FOREARM COMPLEX WITH WOUND CLOSURE;  Surgeon: Roseanne Kaufman, MD;  Location: [place];  Service:  Orthopedics;  Laterality: Right;    Assessment & Plan Clinical Impression: Ernest [name] is a 60 year old male with history of T2DM, HTN, seizure disorder, prior CVA who was admitted to [facility] on 04/08/16 with left facial droop and left sided weakness due to large acute IPH involving right parietal and temporal region with possible IVH.  Conservative therapy as well as BP management of malignant BP recommended. He did have neurological decline and was taken to OR on 07/11 for emergent right crani for evacuation of hemorrhage by Dr. Radonna Ricker. Post op he has had issues with elevated BP as well as anasarca due to fluid overload. He was treated with IV lasix and transferred to [facility] on 07/18  for therapy.   He continued to require IV diuresis and compression wraps for BLE edema. Foley was placed on 7/25 due to problems with retention. Lasix was changed to po and he was started on flomax. He was started on bowel regimen for abdominal pain with question of mild ileus. Medications have been adjusted for tighter blood pressure control, lantus was decreased due to hypoglycemic episodes and Zoloft added to help with depressive symptoms.  Foley and PICC discontinued prior to discharge to CIR.   Patient with resultant left sided weakness with confusion and lability, left sensory deficits with right gaze preference Therapy ongoing and he is showing improvement in endurance as well as improvement in ability to follow simple commands with cues  Patient transferred to CIR on 04/26/2016 .    Patient currently requires max with basic self-care skills secondary to muscle weakness, decreased cardiorespiratoy endurance, abnormal tone, motor apraxia, decreased coordination and decreased motor planning, decreased visual perceptual skills and  field cut, decreased midline orientation, decreased attention to left and decreased motor planning, decreased attention, decreased awareness, decreased problem solving, decreased safety awareness and  decreased memory and decreased sitting balance, decreased standing balance, decreased postural control, hemiplegia and decreased balance strategies.  Prior to hospitalization, patient could complete BADLs with modified independent .  Patient will benefit from skilled intervention to increase independence with basic self-care skills prior to discharge home with care partner.  Anticipate patient will require minimal physical assistance and follow up home health.  OT - End of Session Activity Tolerance: Decreased this session Endurance Deficit: Yes (rest breaks) OT Assessment Rehab Potential (ACUTE ONLY): Good OT Patient demonstrates impairments in the following area(s): Balance;Cognition;Endurance;Motor;Perception;Safety;Sensory OT Basic ADL's Functional Problem(s): Grooming;Dressing;Bathing;Toileting OT Transfers Functional Problem(s): Toilet;Tub/Shower OT Additional Impairment(s): Fuctional Use of Upper Extremity (LUE) OT Plan OT Intensity: Minimum of 1-2 x/day, 45 to 90 minutes OT Frequency: 5 out of 7 days OT Duration/Estimated Length of Stay: 16-22 days OT Treatment/Interventions: Balance/vestibular training;Cognitive remediation/compensation;Community reintegration;Discharge planning;Functional mobility training;Patient/family education;Neuromuscular re-education;Self Care/advanced ADL retraining;Therapeutic Activities;Therapeutic Exercise;UE/LE Strength taining/ROM;UE/LE Coordination activities;Splinting/orthotics;Visual/perceptual remediation/compensation;Wheelchair propulsion/positioning OT Self Feeding Anticipated Outcome(s): n/a OT Basic Self-Care Anticipated Outcome(s): grooming and UB dressing setup assist; LB dressing and bathing min assist OT Toileting Anticipated Outcome(s): min assist OT Bathroom Transfers Anticipated Outcome(s): min assist OT Recommendation Patient destination: Home Follow Up Recommendations: Home health OT Equipment Recommended: To be determined   Skilled  Therapeutic Intervention OT evaluation completed. Discussed role of OT, goals of therapy, possible ELOS, DME, safety plan, and fall risk. OT attempted to call wife to communicate the stated above however she was not available. Pt completed bathing and dressing at sink requiring mod assist sit<>stand and max A for dynamic standing balance due to posterior lean. Pt was perseverative on locating his wife throughout session, requiring max cues for attention. Pt also demonstrated significant left visual field deficit requiring total assist to located items. At end of session pt left sitting in w/c with all needs in reach.  OT Evaluation Precautions/Restrictions  Precautions Precautions: Fall Restrictions Weight Bearing Restrictions: No General   Vital Signs Therapy Vitals Temp: 98.3 F (36.8 C) Temp Source: Oral Pulse Rate: 71 Resp: 18 BP: (!) 144/72 Patient Position (if appropriate): Lying Oxygen Therapy SpO2: 98 % O2 Device: Not Delivered Pain Pain Assessment Pain Assessment: 0-10 Pain Score: 5  Pain Type: Acute pain Pain Location: Head Home Living/Prior Functioning Home Living Available Help at Discharge: Family, Available 24 hours/day Type of Home: House Home Access: Stairs to enter [employer] bre[employer] of Steps: small step Home Layout: One level Bathroom Shower/Tub: Gaffer, Chiropodist: Standard Bathroom Accessibility: Yes  Lives With: Spouse Prior Function Level of Independence: Independent with basic ADLs, Independent with transfers, Independent with gait Driving: Yes Vocation: Retired Leisure: Hobbies-yes (Comment) Comments: watch sports, basketball ADL   Vision/Perception  Vision- History Baseline Vision/History: Wears glasses Wears Glasses: Reading only Patient Visual Report: No change from baseline Vision- Assessment Vision Assessment?: Yes Ocular Range of Motion: Restricted on the left Alignment/Gaze Preference: Gaze  right Tracking/Visual Pursuits: Unable to hold eye position out of midline;Impaired - to be further tested in functional context Saccades: Impaired - to be further tested in functional context Convergence: Impaired - to be further tested in functional context Visual Fields: Left visual field deficit  Cognition Overall Cognitive Status: Impaired/Different from baseline Arousal/Alertness: Awake/alert Orientation Level: Person Year: Other (Comment) (2014) Month: April Day of Week: Incorrect Memory: Impaired Memory Impairment: Decreased recall of new information;Decreased  short term memory;Retrieval deficit Immediate Memory Recall: Sock;Blue Memory Recall: Sock Memory Recall Sock: Without Cue Attention: Sustained Sustained Attention: Impaired Sustained Attention Impairment: Verbal basic;Functional basic Awareness: Impaired Awareness Impairment: Intellectual impairment Problem Solving: Impaired Problem Solving Impairment: Verbal basic;Functional basic Behaviors: Perseveration Safety/Judgment: Impaired Comments: perseverative on finding out what time his wife will get to the hospital Sensation Sensation Light Touch: Impaired Detail Light Touch Impaired Details: Impaired LUE;Impaired LLE Proprioception: Impaired Detail Proprioception Impaired Details: Impaired LUE;Impaired LLE Coordination Gross Motor Movements are Fluid and Coordinated: No Fine Motor Movements are Fluid and Coordinated: No Finger Nose Finger Test: unable to complete with LUE due to weakness and apraxia Motor  Motor Motor: Hemiplegia;Abnormal tone;Motor apraxia Mobility  Bed Mobility Bed Mobility: Supine to Sit Supine to Sit: 3: Mod assist Supine to Sit Details: Tactile cues for initiation;Tactile cues for sequencing;Verbal cues for sequencing;Manual facilitation for weight shifting Transfers Transfers: Sit to Stand;Stand to Sit Sit to Stand: 3: Mod assist;4: Min assist Sit to Stand Details: Tactile cues for  initiation;Tactile cues for sequencing;Manual facilitation for weight shifting;Manual facilitation for placement Stand to Sit: 3: Mod assist;2: Max assist Stand to Sit Details: poor controlled decent  Trunk/Postural Assessment     Balance Balance Balance Assessed: Yes Static Sitting Balance Static Sitting - Balance Support: Feet supported;Bilateral upper extremity supported Static Sitting - Level of Assistance: 5: Stand by assistance;4: Min assist;Other (comment) (demonstrates extension with fatigue) Dynamic Sitting Balance Dynamic Sitting - Balance Support: During functional activity;Feet supported (posterior LOB) Static Standing Balance Static Standing - Balance Support: Bilateral upper extremity supported Static Standing - Level of Assistance: 4: Min assist Extremity/Trunk Assessment RUE Assessment RUE Assessment: Within Functional Limits LUE Assessment LUE Assessment: Exceptions to WFL LUE AROM (degrees) Overall AROM Left Upper Extremity: Deficits;Other (comment) (inconsistencies with ROM during functional tasks and upon verbal command for assessment) LUE PROM (degrees) Overall PROM Left Upper Extremity: Within functional limits for tasks assessed LUE Strength LUE Overall Strength: Deficits (3/5)   See Function Navigator for Current Functional Status.   Refer to Care Plan for Long Term Goals  Recommendations for other services: None  Discharge Criteria: Patient will be discharged from OT if patient refuses treatment 3 consecutive times without medical reason, if treatment goals not met, if there is a change in medical status, if patient makes no progress towards goals or if patient is discharged from hospital.  The above assessment, treatment plan, treatment alternatives and goals were discussed and mutually agreed upon: by patient  Duayne Cal 04/27/2016, 7:10 AM

## 2016-04-27 NOTE — Evaluation (Signed)
Speech Language Pathology Assessment and Plan  Patient Details  Name: Ernest [name] MRN: [id number] Date of Birth: 11-23-1955  SLP Diagnosis: Dysphagia;Cognitive Impairments  Rehab Potential: Good ELOS: 21 days    Today's Date: 04/27/2016 SLP Individual Time: 1100-1200 SLP Individual Time Calculation (min): 60 min    Problem List:  Patient Active Problem List   Diagnosis Date Noted  . Nontraumatic cortical hemorrhage of right cerebral hemisphere ([place]) 04/26/2016  . 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 Abscess of right arm 08/18/2013  . Essential hypertension 08/18/2013  . Poorly controlled type 2 diabetes mellitus ([place]) 08/18/2013  . Thrombocytopenia, unspecified ([place]) 08/18/2013   Past Medical History:  Past Medical History:  Diagnosis Date  . CHF (congestive heart failure) ([place])   . CVA (cerebral infarction)   . Diabetes mellitus without complication ([place])   . GERD (gastroesophageal reflux disease)   . Hypertension   . OSA (obstructive sleep apnea)   . Seizure disorder [facility])    Past Surgical History:  Past Surgical History:  Procedure Laterality Date  . APPLICATION OF WOUND VAC Right 08/20/2013   Procedure: VAC ASSISTED CLOSURE FOREARM;  Surgeon: Roseanne Kaufman, MD;  Location: [place];  Service: Orthopedics;  Laterality: Right;  . I&D EXTREMITY Right 08/18/2013   Procedure: IRRIGATION AND DEBRIDEMENT EXTREMITY;  Surgeon: Roseanne Kaufman, MD;  Location: [place];  Service: Orthopedics;  Laterality: Right;  . I&D EXTREMITY Right 08/20/2013   Procedure: IRRIGATION AND DEBRIDEMENT WASHOUT ARM;  Surgeon: Roseanne Kaufman, MD;  Location: [place];  Service: Orthopedics;  Laterality: Right;  . 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  Conservative therapy as well as BP management of malignant BP recommended. He did have neurological decline and was taken to OR on 07/11 for emergent right crani for evacuation of hemorrhage by Dr. Radonna Ricker. Post op he has had issues with elevated BP as well as anasarca due to fluid overload. He was treated with IV lasix and transferred to [facility] on 07/18  for therapy.   He continued to require IV diuresis and compression wraps for BLE edema. Foley was placed on 7/25 due to problems with retention. Lasix was changed to po and he was started on flomax. He was started on bowel regimen for abdominal pain with question of mild ileus. Medications have been adjusted for tighter blood pressure control, lantus was decreased due to hypoglycemic episodes and Zoloft added to help with depressive symptoms.  Foley and PICC discontinued prior to discharge to CIR.   Patient with resultant left sided weakness with confusion and lability, left sensory deficits with right gaze preference Therapy ongoing and he is showing improvement in endurance as well as improvement in ability to follow simple commands with cues. Chart reviewed by Rehab MD and patient felt to be CIR candidate.  Upon transfer to rehab, pt participated in cognitive-linguistic assessment and swallow evaluation. Risk of aspiration is felt primarily to be secondary to cognitive impairment and pt would benefit from full supervision/assist with all PO. Will initially address deficits related to awareness, attention and  L neglect and progress goals as appropriate. Pt and spouse in agreement with POC.  Skilled Therapeutic Interventions          Pt is oriented x self and "something wrong with my head only." When provided with multiple choice options, pt  selected the month as March. Pt was able to recall the therapist name throughout the entire session with the use of association and rehearsal for initial recall then recall in 5-10 minute increments for the remainder of the session. Pt was quite labile and required frequent redirection and direct communication to change behavior. Attention to functional tasks required max A for consistent verbal and tactile remainders. Focused visual attention proved very challenging to attain despite max verbal, tactile and visual cueing. Initial education completed with pt and his spouse re: POC and expectations for rehab. Both parties verbalized understanding.   SLP Assessment  Patient will need skilled Speech Lanaguage Pathology Services during CIR admission    Recommendations  SLP Diet Recommendations: Dysphagia 2 (Fine chop);Thin Liquid Administration via: Cup Medication Administration: Whole meds with liquid Supervision: Staff to assist with self feeding;Full supervision/cueing for compensatory strategies Compensations: Minimize environmental distractions;Slow rate;Small sips/bites Postural Changes and/or Swallow Maneuvers: Seated upright 90 degrees Oral Care Recommendations: Oral care BID Recommendations for Other Services: Neuropsych consult Patient destination: Home Follow up Recommendations: Outpatient SLP Equipment Recommended: None recommended by SLP    SLP Frequency 3 to 5 out of 7 days   SLP Duration  SLP Intensity  SLP Treatment/Interventions 21 days  Minumum of 1-2 x/day, 30 to 90 minutes  Cognitive remediation/compensation;Dysphagia/aspiration precaution training;Cueing hierarchy;Environmental controls;Patient/family education;Functional tasks    Pain Pain Assessment Pain Assessment: No/denies pain Pain Score: Asleep  Prior Functioning Cognitive/Linguistic Baseline: Baseline deficits Baseline deficit details: short term memory Type of Home: House  Lives With: Spouse Available  Help at Discharge: Family;Available 24 hours/day Vocation: On disability  Function:  Eating Eating   Modified Consistency Diet: Yes Eating Assist Level: More than reasonable amount of time;Supervision or verbal cues;Helper scoops food on utensil;Help managing cup/glass;Help with picking up utensils;Helper brings food to mouth   Eating Set Up Assist For: Opening containers;Cutting food Helper [place] on Utensil: Occasionally Helper Brings Food to Mouth: Occasionally   Cognition Comprehension Comprehension assist level: Understands basic 50 - 74% of the time/ requires cueing 25 - 49% of the time  Expression   Expression assist level: Expresses basic 50 - 74% of the time/requires cueing 25 - 49% of the time. Needs to repeat parts of sentences.  Social Interaction Social Interaction assist level: Interacts appropriately 25 - 49% of time - Needs frequent redirection.  Problem Solving Problem solving assist level: Solves basic less than 25% of the time - needs direction nearly all the time or does not effectively solve problems and may need a restraint for safety  Memory Memory assist level: Recognizes or recalls less than 25% of the time/requires cueing greater than 75% of the time   Short Term Goals: Week 1: SLP Short Term Goal 1 (Week 1): Pt to demonstrate general orientation x 4 with mod A. SLP Short Term Goal 2 (Week 1): Pt to demonstrate gaze shifting to midline with max A in 3/4 opportunities.   SLP Short Term Goal 3 (Week 1): Pt to tolerate trials of Dys 3 with adequate oral clearance with min A. SLP Short Term Goal 4 (Week 1): Pt to demonstrate basic problem solving during functional tasks with max A. SLP Short Term Goal 5 (Week 1):  Pt to attend to task for 5 minutes with mod A.   Refer to Care Plan for Long Term Goals  Recommendations for other services: Neuropsych  Discharge Criteria: Patient will be discharged from SLP if patient refuses treatment 3 consecutive times  without medical reason, if treatment goals not met, if there is a change in medical status, if patient makes no progress towards goals or if patient is discharged from hospital.  The above assessment, treatment plan, treatment alternatives and goals were discussed and mutually agreed upon: by patient and by family  Vinetta Bergamo MA, [place] 04/27/2016, 5:52 PM

## 2016-04-28 ENCOUNTER — Inpatient Hospital Stay (HOSPITAL_COMMUNITY): Payer: Medicare Other | Admitting: Physical Therapy

## 2016-04-28 LAB — BASIC METABOLIC PANEL
ANION GAP: 8 (ref 5–15)
BUN: 13 mg/dL (ref 6–20)
CHLORIDE: 98 mmol/L — AB (ref 101–111)
CO2: 28 mmol/L (ref 22–32)
Calcium: 8.8 mg/dL — ABNORMAL LOW (ref 8.9–10.3)
Creatinine, Ser: 0.93 mg/dL (ref 0.61–1.24)
GFR calc Af Amer: >60 mL/min (ref >60)
GFR calc non Af Amer: >60 mL/min (ref >60)
GLUCOSE: 184 mg/dL — AB (ref 65–99)
POTASSIUM: 3.5 mmol/L (ref 3.5–5.1)
Sodium: 134 mmol/L — ABNORMAL LOW (ref 135–145)

## 2016-04-28 LAB — GLUCOSE, CAPILLARY
GLUCOSE-CAPILLARY: 168 mg/dL — AB (ref 65–99)
GLUCOSE-CAPILLARY: 142 mg/dL — AB (ref 65–99)
Glucose-Capillary: 183 mg/dL — ABNORMAL HIGH (ref 65–99)
Glucose-Capillary: 234 mg/dL — ABNORMAL HIGH (ref 65–99)

## 2016-04-28 MED ORDER — POTASSIUM CHLORIDE CRYS ER 20 MEQ PO TBCR
40.0000 meq | EXTENDED_RELEASE_TABLET | Freq: Two times a day (BID) | ORAL | Status: DC
  Administered 2016-04-29 – 2016-05-06 (×15): 40 meq via ORAL
  Filled 2016-04-28 (×15): qty 2

## 2016-04-28 MED ORDER — INSULIN GLARGINE 100 UNIT/ML ~~LOC~~ SOLN
5.0000 [IU] | Freq: Two times a day (BID) | SUBCUTANEOUS | Status: DC
  Administered 2016-04-28 – 2016-04-30 (×5): 5 [IU] via SUBCUTANEOUS
  Filled 2016-04-28 (×6): qty 0.05

## 2016-04-28 NOTE — Plan of Care (Signed)
Problem: RH BLADDER ELIMINATION Goal: RH STG MANAGE BLADDER WITH EQUIPMENT WITH ASSISTANCE STG Manage Bladder With Equipment With Total  Assistance  Outcome: Progressing Condom cath in use at [facility]

## 2016-04-28 NOTE — Progress Notes (Signed)
[place] PHYSICAL MEDICINE & REHABILITATION     PROGRESS NOTE    Subjective/Complaints: No new issues this morning. Slept overnight. Headache at times  ROS limited by mental status  Objective: Vital Signs: Blood pressure (!) 162/71, pulse 75, temperature 98.3 F (36.8 C), temperature source Oral, resp. rate 18, height  (1.753 m), weight 108.8 kg (239 lb 14.4 oz), SpO2 98 %. No results found.  Recent Labs  04/27/16 0536  WBC 4.3  HGB 10.6*  HCT 32.1*  PLT 147*    Recent Labs  04/26/16 1532 04/27/16 0536  NA  --  133*  K  --  2.9*  CL  --  97*  GLUCOSE  --  146*  BUN  --  12  CREATININE 0.98 0.97  CALCIUM  --  8.3*   CBG (last 3)   Recent Labs  04/27/16 1621 04/27/16 2052 04/28/16 0651  GLUCAP 177* 242* 183*    Wt Readings from Last 3 Encounters:  04/28/16 108.8 kg (239 lb 14.4 oz)  04/26/16 117 kg (258 lb)  08/18/13 89.1 kg (196 lb 6.4 oz)    Physical Exam:  Constitutional: He appears well-developed and well-nourished.  HENT:  Head: Normocephalic and atraumatic.  Eyes: Conjunctivae and EOM are normal.  Neck: Normal range of motion. Neck supple. No JVD present. No tracheal deviation present. No thyromegaly present.  Cardiovascular: Normal rate and regular rhythm.   Respiratory: Effort normal and breath sounds normal. No respiratory distress. He has no wheezes. He has no rales.  GI: Soft. Bowel sounds are normal. He exhibits no distension. There is no tenderness.  Musculoskeletal: He exhibits edema (2+ pitting edema LLE).  Neurological: He is alert.  Still confused. Concentration a little better today. Right gaze preference with eyes fixed to right--able to turn to midline with max cues. Able to follow simple motor commands with cues.  Left inattention with decreased sensation. Has poor insight and lacks awareness of deficits.  Does move both LUE and LLE grossly 3 to 4/5 but very inconsistent. Senses pain when pinched. RUE and RLE 5/5  Skin: Skin  is warm and dry. No rash noted. No erythema.  Psychiatric:  Anxious, impulsive   Assessment/Plan: 1. Cognitive, mobility, and visual-spatial deficits secondary to right parietal-temporal ICH which require 3+ hours per day of interdisciplinary therapy in a comprehensive inpatient rehab setting. Physiatrist is providing close team supervision and 24 hour management of active medical problems listed below. Physiatrist and rehab team continue to assess barriers to discharge/monitor patient progress toward functional and medical goals.  Function:  Bathing Bathing position      Bathing parts Body parts bathed by patient: Left arm, Chest, Abdomen, Front perineal area, Right upper leg, Left upper leg Body parts bathed by helper: Right arm, Buttocks, Right lower leg, Left lower leg, Back  Bathing assist Assist Level:  (max assist for thoroughness)      Upper Body Dressing/Undressing Upper body dressing   What is the patient wearing?: Hospital gown                Upper body assist        Lower Body Dressing/Undressing Lower body dressing   What is the patient wearing?: Non-skid slipper socks           Non-skid slipper socks- Performed by helper: Don/doff right sock, Don/doff left sock                  Lower body assist  Toileting Toileting     Toileting steps completed by helper: Adjust clothing prior to toileting, Performs perineal hygiene, Adjust clothing after toileting Toileting Assistive Devices: Grab bar or rail  Toileting assist Assist level: More than reasonable time   Transfers Chair/bed transfer     Chair/bed transfer assist level: Maximal assist (Pt 25 - 49%/lift and lower)       Locomotion Ambulation     Max distance: 10 Assist level: 2 helpers   Wheelchair   Type: Manual Max wheelchair distance: 25 Assist Level: Touching or steadying assistance (Pt > 75%)  Cognition Comprehension Comprehension assist level: Understands basic 75 -  89% of the time/ requires cueing 10 - 24% of the time  Expression Expression assist level: Expresses basic 50 - 74% of the time/requires cueing 25 - 49% of the time. Needs to repeat parts of sentences.  Social Interaction Social Interaction assist level: Interacts appropriately 50 - 74% of the time - May be physically or verbally inappropriate.  Problem Solving Problem solving assist level: Solves basic 25 - 49% of the time - needs direction more than half the time to initiate, plan or complete simple activities  Memory Memory assist level: Recognizes or recalls less than 25% of the time/requires cueing greater than 75% of the time   Medical Problem List and Plan: 1.  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                       -continue CIR therapies 2.  DVT Prophylaxis/Anticoagulation: Mechanical: Sequential compression devices, below knee Bilateral lower extremities, lovenox 40 qd 3. Pain Management: tylenol and percocet prn. Limit narcs as possible.  4. Mood: team will follow for ego support. Remains confused at present. Provide consistent routine to assist with anxiety 5. Neuropsych: This patient is not capable of making decisions on his own behalf. 6. Skin/Wound Care: local pressure relief, elevate lower ext                       -optimize nutrition 7. Fluids/Electrolytes/Nutrition:   -K+ 2.9 yesterday---today's labs pending  -repleting K+              -follow I's and O's daily 8. Urine retention: remove foley.                       -encourage OOB to void/check PVR's, I and O caths prn                       -UA equivocal. Cx not done!!--ordered today 9. Fluid overload- lasix 60mg  BID--adjust regimen as indicated                       -daily weights                       -follow I and O's 10. DM2- check cbg's QID                       -lantus-- resume at 5u bid (on 15u at home)                        -SSI   -follow for pattern 11. HTN:   fair control at present                       -  continue normodyne, hydralazine, lasix, catapres tabs,                        -follow and adjust regimen as indicated with increased activity 12. Seizure proph: keppra 500mg  bid 13. ABLA--hgb trending up  -10.6 yesterday   LOS (Days) 2 A FACE TO FACE EVALUATION WAS PERFORMED  Akio Hudnall T 04/28/2016 8:16 AM

## 2016-04-28 NOTE — Progress Notes (Signed)
Physical Therapy Session Note  Patient Details  Name: Ernest [name] MRN: [id number] Date of Birth: May 19, 1956  Today's Date: 04/28/2016 PT Individual Time: 1100-1155 PT Individual Time Calculation (min): 55 min    Short Term Goals: Week 1:  PT Short Term Goal 1 (Week 1): Pt will perform gait with mod A 25' in controlled environment PT Short Term Goal 2 (Week 1): pt will perform functional transfers consistently at mod A PT Short Term Goal 3 (Week 1): pt will demo intellectual awareness with mod A  Skilled Therapeutic Interventions/Progress Updates:    Patient resting in bed upon arrival with wife, son, and grandson present for session. Patient perseverative throughout session on being cold and tired, reinforced education regarding goals and purpose of PT and provided max cues to attend to task. Patient required increased time and encouragement to sit edge of bed with max A to initiate bringing BLE off bed and to elevate trunk and max verbal cues for sequencing and technique. Patient instructed in stand pivot transfer to wheelchair to L but due to posterior LOB and difficulty sequencing despite max cues, patient returned to sitting EOB and performed squat pivot to L with max A and HOH assist for L hand placement on wheelchair arm rest. Patient reporting need to urinate, performed sit <> stand with mod A and patient stood using RW with steady assist while NT held urinal and managed clothing. Gait training in straight path using RW x 62 ft with min-mod A overall and +2 for wheelchair follow for safety with max cues for navigating environment and physical assistance to control RW and keep close to body as patient unable to follow commands for safe use of RW. Stair training up/down 8 (3") stairs using 2 rails with max multimodal cues for safe sequencing and advancing LUE along rail with mod A overall, assist to clear/safely place LLE when descending. Patient instructed in wheelchair propulsion using BUE in  controlled environment x 75 ft with hand over hand assist for LUE and max multimodal cues for sequencing, technique, and navigating environment. Patient with poor carryover of instructions and required max A overall. Discussed with patient, family, and nurse plan for patient to stay sitting up in wheelchair until after lunch before returning to bed for increased OOB tolerance, all verbalized agreement. Patient left sitting in wheelchair with quick release belt donned and family present.   Therapy Documentation Precautions:  Precautions Precautions: Fall Precaution Comments: Swallowing precautions Restrictions Weight Bearing Restrictions: No Pain: Pain Assessment Pain Assessment: No/denies pain   See Function Navigator for Current Functional Status.   Therapy/Group: Individual Therapy  Kerney Elbe 04/28/2016, 12:17 PM

## 2016-04-29 ENCOUNTER — Inpatient Hospital Stay (HOSPITAL_COMMUNITY): Payer: Medicare Other | Admitting: Physical Therapy

## 2016-04-29 ENCOUNTER — Inpatient Hospital Stay (HOSPITAL_COMMUNITY): Payer: Medicare Other | Admitting: Speech Pathology

## 2016-04-29 ENCOUNTER — Inpatient Hospital Stay (HOSPITAL_COMMUNITY): Payer: Medicare Other | Admitting: Occupational Therapy

## 2016-04-29 LAB — GLUCOSE, CAPILLARY
GLUCOSE-CAPILLARY: 146 mg/dL — AB (ref 65–99)
GLUCOSE-CAPILLARY: 190 mg/dL — AB (ref 65–99)
GLUCOSE-CAPILLARY: 173 mg/dL — AB (ref 65–99)
Glucose-Capillary: 133 mg/dL — ABNORMAL HIGH (ref 65–99)

## 2016-04-29 MED ORDER — CYCLOBENZAPRINE HCL 5 MG PO TABS
5.0000 mg | ORAL_TABLET | Freq: Every day | ORAL | Status: DC
  Administered 2016-04-29 – 2016-05-15 (×15): 5 mg via ORAL
  Filled 2016-04-29 (×15): qty 1

## 2016-04-29 MED ORDER — LIDOCAINE HCL 2 % EX GEL: CUTANEOUS | Status: DC | PRN

## 2016-04-29 MED ORDER — CLONIDINE HCL 0.2 MG PO TABS
0.2000 mg | ORAL_TABLET | Freq: Three times a day (TID) | ORAL | Status: DC
  Administered 2016-04-29 – 2016-05-04 (×16): 0.2 mg via ORAL
  Filled 2016-04-29 (×17): qty 1

## 2016-04-29 MED ORDER — TOPIRAMATE 25 MG PO TABS
25.0000 mg | ORAL_TABLET | Freq: Every day | ORAL | Status: DC
  Administered 2016-04-29: 25 mg via ORAL
  Filled 2016-04-29: qty 1

## 2016-04-29 MED ORDER — TRAZODONE HCL 50 MG PO TABS: 25.0000 mg | ORAL_TABLET | Freq: Every evening | ORAL | Status: DC | PRN

## 2016-04-29 MED ORDER — CIPROFLOXACIN HCL 250 MG PO TABS
250.0000 mg | ORAL_TABLET | Freq: Two times a day (BID) | ORAL | Status: AC
  Administered 2016-04-30 – 2016-05-02 (×4): 250 mg via ORAL
  Filled 2016-04-29 (×4): qty 1

## 2016-04-29 NOTE — Progress Notes (Signed)
Occupational Therapy Session Note  Patient Details  Name: Ernest [name] MRN: [id number] Date of Birth: [birth year]  Today's Date: 04/29/2016 OT Individual Time: 1430-1458 OT Individual Time Calculation (min): 28 min     Short Term Goals: Week 1:  OT Short Term Goal 1 (Week 1): Pt will complete toilet transfer with mod assist  OT Short Term Goal 2 (Week 1): Pt will visual scan environment to locate 3 items with min verbal prompts OT Short Term Goal 3 (Week 1): Pt will complete sit<>stand with min assist OT Short Term Goal 4 (Week 1): Pt will complete LB dressing with mod assist OT Short Term Goal 5 (Week 1): Pt will complete bathing with mod assist and moderate verbal cues for sequencing and completion  Skilled Therapeutic Interventions/Progress Updates:    Treatment session with focus on attention to task, orientation, sitting balance, and Lt attention.  Pt asleep in bed upon arrival, aroused to voice and touch.  Pt reports headache 9/10, RN notified and provided meds during session.  Pt demonstrating decreased attention, orientation, and memory with inability to recall season, location, situation, and that he had received pain meds during session.  Min-mod assist sitting balance with min tactile cues for upright sitting balance.  Attempted to engage in visual scanning task to orient to midline, with pt unable to fixate on any one point to follow visual stimulus or locate item to Rt of midline.  Sit > stand to engage in sidestepping to increase positioning in bed with pt completing sit > stand with max assist, not remaining standing and therefore completing lateral scoot up in bed before returning to supine.  Therapy Documentation Precautions:  Precautions Precautions: Fall Precaution Comments: Swallowing precautions Restrictions Weight Bearing Restrictions: No General: General PT Missed Treatment Reason: Patient fatigue Vital Signs: Therapy Vitals Temp: 98.3 F (36.8 C) Temp Source:  Oral Pulse Rate: 82 Resp: 20 BP: (!) 154/80 Oxygen Therapy SpO2: 98 % O2 Device: Not Delivered Pain: Pain Assessment Pain Assessment: Faces Faces Pain Scale: Hurts whole lot Pain Type: Acute pain Pain Location: Head Pain Descriptors / Indicators: Aching Pain Frequency: Intermittent Pain Onset: Gradual Patients Stated Pain Goal: 3 Pain Intervention(s): Medication (See eMAR)  See Function Navigator for Current Functional Status.   Therapy/Group: Individual Therapy  Rosalio Loud 04/29/2016, 3:07 PM

## 2016-04-29 NOTE — Progress Notes (Signed)
Social Work Patient ID: Ernest [name], male   DOB: [birth year], 60 y.o.   MRN: [id number]   CSW went to visit pt to introduce self and role and complete assessment.  Pt was extremely fatigued and opened eyes, but could not converse with CSW.  CSW will reach out to pt's wife via telephone, as no family present at the time of CSW's visit.

## 2016-04-29 NOTE — Progress Notes (Signed)
Occupational Therapy Session Note  Patient Details  Name: Ernest [name] MRN: [id number] Date of Birth: May 05, 1956  Today's Date: 04/29/2016 OT Individual Time: 1133-1203 OT Individual Time Calculation (min): 30 min     Short Term Goals: Week 1:  OT Short Term Goal 1 (Week 1): Pt will complete toilet transfer with mod assist  OT Short Term Goal 2 (Week 1): Pt will visual scan environment to locate 3 items with min verbal prompts OT Short Term Goal 3 (Week 1): Pt will complete sit<>stand with min assist OT Short Term Goal 4 (Week 1): Pt will complete LB dressing with mod assist OT Short Term Goal 5 (Week 1): Pt will complete bathing with mod assist and moderate verbal cues for sequencing and completion  Skilled Therapeutic Interventions/Progress Updates:    Treatment session with focus on visual attention, attention to task, NMR, and standing tolerance. Pt received in bed in room with reports of feeling tired and cold, but agreeable to therapy. Pt noted to perseverate on reports of feeling "cold" throughout session with noted withdrawal of bilateral hands when touching cool surface of sink in room. Pt completed squat pivot WC <> bed with Mod A.   Engaged pt in NMR BUE weight bearing at sink in room for improved standing tolerance while completing hygiene. Max multimodal cues to pt for maintaining attention to task, attention to tendency for L twisting while standing, weight bearing in BLEs, and bringing visual attention to midline. Cuing helpful in pt visual attention redirect, though pt not observed to be able to maintain midline focus - or focus on any object in field of view - during session.   Utilized UE Ranger with pt seated at EOB to provide tactile cues and proprioceptive input with pt / clinician hand over hand to guide LUE in all planes of movement. Pt consistently able to visually track only to midline during tasks. Noted pt requirement for L postural support during seated activities.    Transferred pt back to bed and left with all needs in reach.   Therapy Documentation Precautions:  Precautions Precautions: Fall Precaution Comments: Swallowing precautions Restrictions Weight Bearing Restrictions: No General:   Vital Signs: Therapy Vitals Pulse Rate: 91 BP: (!) 168/95 Patient Position (if appropriate): Sitting Oxygen Therapy SpO2: 100 % Pain: Pt with no c/o pain this session.   See Function Navigator for Current Functional Status.   Therapy/Group: Individual Therapy  Leafy Kindle 04/29/2016, 12:14 PM

## 2016-04-29 NOTE — Progress Notes (Signed)
[place] PHYSICAL MEDICINE & REHABILITATION     PROGRESS NOTE    Subjective/Complaints: Complains of headache. No new issues per RN. Slept last night.  ROS limited by mental status  Objective: Vital Signs: Blood pressure (!) 168/95, pulse 91, temperature 98 F (36.7 C), temperature source Oral, resp. rate 18, height 5\' 9"  (1.753 m), weight 106.1 kg (234 lb), SpO2 100 %. No results found.  Recent Labs  04/27/16 0536  WBC 4.3  HGB 10.6*  HCT 32.1*  PLT 147*    Recent Labs  04/27/16 0536 04/28/16 0731  NA 133* 134*  K 2.9* 3.5  CL 97* 98*  GLUCOSE 146* 184*  BUN 12 13  CREATININE 0.97 0.93  CALCIUM 8.3* 8.8*   CBG (last 3)   Recent Labs  04/28/16 1628 04/28/16 2046 04/29/16 0652  GLUCAP 168* 142* 146*    Wt Readings from Last 3 Encounters:  04/29/16 106.1 kg (234 lb)  04/26/16 117 kg (258 lb)  08/18/13 89.1 kg (196 lb 6.4 oz)    Physical Exam:  Constitutional: He appears well-developed and well-nourished.  HENT:  Head: Normocephalic and atraumatic.  Eyes: Conjunctivae and EOM are normal.  Neck: Normal range of motion. Neck supple. No JVD present. No tracheal deviation present. No thyromegaly present.  Cardiovascular: Normal rate and regular rhythm.   Respiratory: Effort normal and breath sounds normal. No respiratory distress. He has no wheezes. He has no rales.  GI: Soft. Bowel sounds are normal. He exhibits no distension. There is no tenderness.  Musculoskeletal: He exhibits edema (1+ to 2+ pitting edema LLE--improving).  Neurological: He is alert.  Still with poor insight and awareness.   Left inattention with decreased sensation.  Poor sitting  Balance due to visual-spatial deficits.  Does move both LUE and LLE grossly 3 to 4/5 but very inconsistent. Senses pain when pinched. RUE and RLE 5/5  Skin: Skin is warm and dry. No rash noted. No erythema.  Psychiatric:  Anxious at times   Assessment/Plan: 1. Cognitive, mobility, and visual-spatial  deficits secondary to right parietal-temporal ICH which require 3+ hours per day of interdisciplinary therapy in a comprehensive inpatient rehab setting. Physiatrist is providing close team supervision and 24 hour management of active medical problems listed below. Physiatrist and rehab team continue to assess barriers to discharge/monitor patient progress toward functional and medical goals.  Function:  Bathing Bathing position   Position: Bed  Bathing parts Body parts bathed by patient: Left arm, Chest, Abdomen, Front perineal area, Right upper leg, Left upper leg Body parts bathed by helper: Right arm, Left arm, Chest, Abdomen, Front perineal area, Buttocks, Right upper leg, Left upper leg, Left lower leg, Back, Right lower leg  Bathing assist Assist Level:  (max assist for thoroughness)      Upper Body Dressing/Undressing Upper body dressing   What is the patient wearing?: Hospital gown                Upper body assist        Lower Body Dressing/Undressing Lower body dressing   What is the patient wearing?: Non-skid slipper socks           Non-skid slipper socks- Performed by helper: Don/doff right sock, Don/doff left sock                  Lower body assist        Toileting Toileting     Toileting steps completed by helper: Adjust clothing prior to toileting, Performs  perineal hygiene, Adjust clothing after toileting Toileting Assistive Devices: Grab bar or rail  Toileting assist Assist level: More than reasonable time   Transfers Chair/bed transfer     Chair/bed transfer assist level: Maximal assist (Pt 25 - 49%/lift and lower) Chair/bed transfer assistive device: Armrests     Locomotion Ambulation     Max distance: 62 ft Assist level: 2 helpers (mod A, +2 wc follow)   Wheelchair   Type: Manual Max wheelchair distance: 75 ft Assist Level: Maximal assistance (Pt 25 - 49%)  Cognition Comprehension Comprehension assist level: Understands basic  75 - 89% of the time/ requires cueing 10 - 24% of the time  Expression Expression assist level: Expresses basic 50 - 74% of the time/requires cueing 25 - 49% of the time. Needs to repeat parts of sentences.  Social Interaction Social Interaction assist level: Interacts appropriately 50 - 74% of the time - May be physically or verbally inappropriate.  Problem Solving Problem solving assist level: Solves basic 25 - 49% of the time - needs direction more than half the time to initiate, plan or complete simple activities  Memory Memory assist level: Recognizes or recalls less than 25% of the time/requires cueing greater than 75% of the time   Medical Problem List and Plan: 1.  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                       -continue CIR therapies 2.  DVT Prophylaxis/Anticoagulation: Mechanical: Sequential compression devices, below knee Bilateral lower extremities, lovenox 40 qd 3. Pain Management: tylenol and percocet prn. Limit narcs as possible.  -headaches are persistent  4. Mood: team will follow for ego support. Remains confused at present. Provide consistent routine to assist with anxiety 5. Neuropsych: This patient is not capable of making decisions on his own behalf. 6. Skin/Wound Care: local pressure relief, elevate lower ext                       -optimize nutrition 7. Fluids/Electrolytes/Nutrition:   -K+ 3.5 yesterday--recheck tomorrow  -repleting K+, reduced to bid              -follow I's and O's daily 8. Urine retention: remove foley.                       -encourage OOB to void/check PVR's, I and O caths prn                       -UA equivocal. Cx pending 9. Fluid overload- lasix 60mg  BID--adjust regimen as indicated                       -daily weights                       -follow I and O's 10. DM2- cbg's still elevated                       -lantus-- resumed  at 5u bid yesterday. (on 15u at home)                       -SSI   -likely increase lantus further tomorrow 11. HTN:   bp's trending up                       -  continue normodyne, hydralazine, lasix, catapres tabs   -increase catapres to 0.2mg  tid                       -follow and adjust regimen as indicated with increased activity 12. Seizure proph: keppra  bid 13. ABLA--hgb trending up  -10.6 saturday   LOS (Days) 3 A FACE TO FACE EVALUATION WAS PERFORMED  Ernest [name] T 04/29/2016 8:49 AM

## 2016-04-29 NOTE — Progress Notes (Signed)
Physical Therapy Session Note  Patient Details  Name: Ernest [name] MRN: [id number] Date of Birth: Feb 01, 1956  Today's Date: 04/29/2016 PT Individual Time: 0830-0900 and  1008-1100  PT Individual Time Calculation (min): 30 min and 52 min     Short Term Goals: Week 1:  PT Short Term Goal 1 (Week 1): Pt will perform gait with mod A 25' in controlled environment PT Short Term Goal 2 (Week 1): pt will perform functional transfers consistently at mod A PT Short Term Goal 3 (Week 1): pt will demo intellectual awareness with mod A  Skilled Therapeutic Interventions/Progress Updates:    First session: Pt received in w/c leaning heavily to L reporting headache, bilat LE also noted to be edematous L >RLE edema in dependent position and reporting bilat LE pain.  Pt also reporting feeling very cold and seeing a large [place] man in the corner that "fell on me and hurt my shoulder."  Pt also reports seeing his wife in his bed and talking to her.  Pt also disoriented to place and time; oriented to self and situation.  No other people in the room.  Attempted to re-orient pt.  Vitals assessed in w/c and RN notified of hallucinations, pain in head, LE and LE edema.  While RN providing medication and donning TEDs therapist obtained bilat ELR.  Donned ELR to w/c for edema management.  RN requesting pt attempt to use urinal due to being on Lasix.  Performed sit > stand and performed static standing at sink with mod A while attempted to use urinal.  Pt unable able urinate due to pain in LE.  Returned to w/c and pt left in w/c with call bed within reach and LE elevated in ELR to await SLP to assist with breakfast.  Second session:  Pt received in w/c; pt continues to report pain and feeling very cold.  Pt in gown and currently does not have his own clothing in the room.  Obtained new long sleeve t-shirt and sweat pants and assisted pt with donning pants and shirt with verbal cues for sequencing and attention to LUE and  LLE.  Performed sit > stand and static standing with +2 to pull up pants.  Performed w/c mobility training for sequencing, coordination, attention to L side and environment and attention to task with max-total A hand over L hand for sequencing and max verbal cues for sequencing x 50'.  Performed transfer to Nustep squat pivot with max A and verbal cues to sequence and attend to L.  Pt set up on Nustep and initiated bilat UE and LE coordination and strengthening but then reported need to use toilet.  Returned to room and performed toilet transfer stand pivot with grab bar and mod-max A with therapist assisting with clothing doffing and re-donning; pt unable to urinate on toilet.  Pt reporting significant fatigue and requesting to lie down.  Pt performed ambulation with RW back to bed with mod-max A with max-total verbal cues for sequencing and safety with RW and attention to environment.  Performed sit > supine with mod A and pt positioned in supine with LE elevated.  Pt continued to report feeling cold; pt temperature assessed-97.9.  Pt left to rest in bed until OT session.  Therapy Documentation Precautions:  Precautions Precautions: Fall Precaution Comments: Swallowing precautions Restrictions Weight Bearing Restrictions: No General: PT Amount of Missed Time (min): 8 Minutes PT Missed Treatment Reason: Patient fatigue Vital Signs: Therapy Vitals Pulse Rate: 91 BP: (!) 168/95 Patient  Position (if appropriate): Sitting Oxygen Therapy SpO2: 100 % Pain: Pain Assessment Pain Assessment: Faces Pain Score: 8  Faces Pain Scale: Hurts a little bit Pain Type: Acute pain Pain Location: Arm Pain Orientation: Right Pain Descriptors / Indicators: Aching Pain Onset: Gradual Patients Stated Pain Goal: 3 Pain Intervention(s): Medication (See eMAR)   See Function Navigator for Current Functional Status.   Therapy/Group: Individual Therapy  Edman Circle [facility] 04/29/2016, 12:22 PM

## 2016-04-29 NOTE — Progress Notes (Signed)
Patient information reviewed and entered into eRehab system by Tashe Purdon, RN, CRRN, PPS Coordinator.  Information including medical coding and functional independence measure will be reviewed and updated through discharge.     Per nursing patient was given "Data Collection Information Summary for Patients in Inpatient Rehabilitation Facilities with attached "Privacy Act Statement-Health Care Records" upon admission.  

## 2016-04-29 NOTE — Progress Notes (Signed)
Patient fatigued and cold today.-. Reviewed PT noted--he has similar symptoms yesterday. Has been complaining of HA not relieved by tylenol and has been requiring cath due recurrent retention-- foley d/c Saturday.   UCS with 30,000 colonies gram negative rods. Will add short course of cipro for treatment empirically.  Decrease flexeril to 5 mg and trazodone to 25 mg to prevent am sedation and  add low dose Topamax to help with management of HA.

## 2016-04-29 NOTE — Progress Notes (Signed)
Speech Language Pathology Daily Session Note  Patient Details  Name: Ernest [name] MRN: [id number] Date of Birth: [birth year]  Today's Date: 04/29/2016 SLP Individual Time: 0902-1001 SLP Individual Time Calculation (min): 59 min   Short Term Goals: Week 1: SLP Short Term Goal 1 (Week 1): Pt to demonstrate general orientation x 4 with mod A. SLP Short Term Goal 2 (Week 1): Pt to demonstrate gaze shifting to midline with max A in 3/4 opportunities.   SLP Short Term Goal 3 (Week 1): Pt to tolerate trials of Dys 3 with adequate oral clearance with min A. SLP Short Term Goal 4 (Week 1): Pt to demonstrate basic problem solving during functional tasks with max A. SLP Short Term Goal 5 (Week 1): Pt to attend to task for 5 minutes with mod A.   Skilled Therapeutic Interventions:  Pt was seen for skilled ST targeting goals for cognition and dysphagia.  Per report from primary PT, pt with visual hallucinations during previous therapy session.  Pt verbally confused,stating that he needed to get to the hospital, but no visual hallucinations noted.  Orientation information reiterated throughout therapy session to facilitate carryover.  Pt required hand over hand assist for initiation to feed himself breakfast.  Max to total assist was needed to attend to objects at midline.  Pt was able to sustain his attention to basic, familiar tasks for ~30 second intervals with max assist verbal cues for redirection. Pt consumed his currently prescribed diet without overt s/s of aspiration and mod assist verbal cues to alternate solids and liquids to clear residue from the oral cavity.  Max encouragement needed for PO intake.  Pt was left in wheelchair with quick release belt donned and call bell left within reach.  Continue per current plan of care.    Function:  Eating Eating   Modified Consistency Diet: Yes Eating Assist Level: Hand over hand assist           Cognition Comprehension Comprehension assist level:  Understands basic 50 - 74% of the time/ requires cueing 25 - 49% of the time  Expression   Expression assist level: Expresses basic 50 - 74% of the time/requires cueing 25 - 49% of the time. Needs to repeat parts of sentences.  Social Interaction Social Interaction assist level: Interacts appropriately 25 - 49% of time - Needs frequent redirection.  Problem Solving Problem solving assist level: Solves basic 25 - 49% of the time - needs direction more than half the time to initiate, plan or complete simple activities  Memory Memory assist level: Recognizes or recalls less than 25% of the time/requires cueing greater than 75% of the time    Pain Pain Assessment Pain Assessment: Faces Pain Score: Asleep Faces Pain Scale: Hurts even more Pain Type: Acute pain Pain Location: Head Pain Descriptors / Indicators: Aching Pain Intervention(s): Other (Comment);Distraction (premedicated prior to therapist's arrival)  Therapy/Group: Individual Therapy  Sabrina Keough, Melanee Spry 04/29/2016, 7:13 PM

## 2016-04-30 ENCOUNTER — Inpatient Hospital Stay (HOSPITAL_COMMUNITY): Payer: Medicare Other | Admitting: Physical Therapy

## 2016-04-30 ENCOUNTER — Inpatient Hospital Stay (HOSPITAL_COMMUNITY): Payer: Medicare Other | Admitting: Occupational Therapy

## 2016-04-30 ENCOUNTER — Inpatient Hospital Stay (HOSPITAL_COMMUNITY): Payer: Medicare Other | Admitting: *Deleted

## 2016-04-30 ENCOUNTER — Inpatient Hospital Stay (HOSPITAL_COMMUNITY): Payer: Medicare Other | Admitting: Speech Pathology

## 2016-04-30 LAB — BASIC METABOLIC PANEL
ANION GAP: 8 (ref 5–15)
BUN: 13 mg/dL (ref 6–20)
CHLORIDE: 99 mmol/L — AB (ref 101–111)
CO2: 26 mmol/L (ref 22–32)
Calcium: 8.6 mg/dL — ABNORMAL LOW (ref 8.9–10.3)
Creatinine, Ser: 1.03 mg/dL (ref 0.61–1.24)
GFR calc non Af Amer: >60 mL/min (ref >60)
Glucose, Bld: 135 mg/dL — ABNORMAL HIGH (ref 65–99)
POTASSIUM: 3.4 mmol/L — AB (ref 3.5–5.1)
SODIUM: 133 mmol/L — AB (ref 135–145)

## 2016-04-30 LAB — CBC WITH DIFFERENTIAL/PLATELET
BASOS PCT: 0 %
Basophils Absolute: 0 10*3/uL (ref 0.0–0.1)
EOS ABS: 0.2 10*3/uL (ref 0.0–0.7)
Eosinophils Relative: 3 %
HCT: 33 % — ABNORMAL LOW (ref 39.0–52.0)
HEMOGLOBIN: 10.7 g/dL — AB (ref 13.0–17.0)
LYMPHS ABS: 1.6 10*3/uL (ref 0.7–4.0)
Lymphocytes Relative: 27 %
MCH: 29.1 pg (ref 26.0–34.0)
MCHC: 32.4 g/dL (ref 30.0–36.0)
MCV: 89.7 fL (ref 78.0–100.0)
Monocytes Absolute: 0.7 10*3/uL (ref 0.1–1.0)
Monocytes Relative: 11 %
NEUTROS PCT: 59 %
Neutro Abs: 3.6 10*3/uL (ref 1.7–7.7)
Platelets: 138 10*3/uL — ABNORMAL LOW (ref 150–400)
RBC: 3.68 MIL/uL — AB (ref 4.22–5.81)
RDW: 12.5 % (ref 11.5–15.5)
WBC: 6.1 10*3/uL (ref 4.0–10.5)

## 2016-04-30 LAB — URINE CULTURE: Culture: 30000 — AB

## 2016-04-30 LAB — GLUCOSE, CAPILLARY
GLUCOSE-CAPILLARY: 149 mg/dL — AB (ref 65–99)
Glucose-Capillary: 168 mg/dL — ABNORMAL HIGH (ref 65–99)
Glucose-Capillary: 114 mg/dL — ABNORMAL HIGH (ref 65–99)
Glucose-Capillary: 153 mg/dL — ABNORMAL HIGH (ref 65–99)

## 2016-04-30 MED ORDER — INSULIN GLARGINE 100 UNIT/ML ~~LOC~~ SOLN
10.0000 [IU] | Freq: Two times a day (BID) | SUBCUTANEOUS | Status: DC
  Administered 2016-04-30 – 2016-05-12 (×24): 10 [IU] via SUBCUTANEOUS
  Filled 2016-04-30 (×32): qty 0.1

## 2016-04-30 MED ORDER — OXYCODONE-ACETAMINOPHEN 5-325 MG PO TABS
1.0000 | ORAL_TABLET | Freq: Four times a day (QID) | ORAL | Status: DC | PRN
Start: 2016-04-30 — End: 2016-05-02
  Administered 2016-04-30: 1 via ORAL
  Filled 2016-04-30: qty 1

## 2016-04-30 MED ORDER — TOPIRAMATE 25 MG PO TABS
25.0000 mg | ORAL_TABLET | Freq: Every day | ORAL | Status: DC
  Administered 2016-04-30: 25 mg via ORAL
  Filled 2016-04-30: qty 1

## 2016-04-30 NOTE — Progress Notes (Signed)
[place] PHYSICAL MEDICINE & REHABILITATION     PROGRESS NOTE    Subjective/Complaints: Still with headaches on right side. Denies other issues this morning.   ROS limited by mental status  Objective: Vital Signs: Blood pressure (!) 153/79, pulse 82, temperature 98.8 F (37.1 C), temperature source Oral, resp. rate 18, height  (1.753 m), weight 103.1 kg (227 lb 4.7 oz), SpO2 93 %. No results found.  Recent Labs  04/30/16 0613  WBC 6.1  HGB 10.7*  HCT 33.0*  PLT 138*    Recent Labs  04/28/16 0731 04/30/16 0613  NA 134* 133*  K 3.5 3.4*  CL 98* 99*  GLUCOSE 184* 135*  BUN 13 13  CREATININE 0.93 1.03  CALCIUM 8.8* 8.6*   CBG (last 3)   Recent Labs  04/29/16 1640 04/29/16 2033 04/30/16 0635  GLUCAP 133* 173* 149*    Wt Readings from Last 3 Encounters:  04/30/16 103.1 kg (227 lb 4.7 oz)  04/26/16 117 kg (258 lb)  08/18/13 89.1 kg (196 lb 6.4 oz)    Physical Exam:  Constitutional: He appears well-developed and well-nourished.  HENT:  Head: Normocephalic and atraumatic.  Eyes: Conjunctivae and EOM are normal.  Neck: Normal range of motion. Neck supple. No JVD present. No tracheal deviation present. No thyromegaly present.  Cardiovascular: Normal rate and regular rhythm.   Respiratory: Effort normal and breath sounds normal. No respiratory distress. He has no wheezes. He has no rales.  GI: Soft. Bowel sounds are normal. He exhibits no distension. There is no tenderness.  Musculoskeletal: He exhibits edema (1+ to 2+ pitting edema LLE--improving).  Neurological: He is alert.  Still with poor insight and awareness.   Left inattention with decreased sensation.  Poor sitting  Balance due to visual-spatial deficits.  Does move both LUE and LLE grossly 3 to 4/5 but very inconsistent. Senses pain when pinched. RUE and RLE 5/5  Skin: Skin is warm and dry. No rash noted. No erythema.  Psychiatric:  Anxious at times   Assessment/Plan: 1. Cognitive,  mobility, and visual-spatial deficits secondary to right parietal-temporal ICH which require 3+ hours per day of interdisciplinary therapy in a comprehensive inpatient rehab setting. Physiatrist is providing close team supervision and 24 hour management of active medical problems listed below. Physiatrist and rehab team continue to assess barriers to discharge/monitor patient progress toward functional and medical goals.  Function:  Bathing Bathing position   Position: Bed  Bathing parts Body parts bathed by patient: Left arm, Chest, Abdomen, Front perineal area, Right upper leg, Left upper leg Body parts bathed by helper: Right arm, Left arm, Chest, Abdomen, Front perineal area, Buttocks, Right upper leg, Left upper leg, Left lower leg, Back, Right lower leg  Bathing assist Assist Level:  (max assist for thoroughness)      Upper Body Dressing/Undressing Upper body dressing   What is the patient wearing?: Hospital gown                Upper body assist        Lower Body Dressing/Undressing Lower body dressing   What is the patient wearing?: Non-skid slipper socks           Non-skid slipper socks- Performed by helper: Don/doff right sock, Don/doff left sock                  Lower body assist        Toileting Toileting     Toileting steps completed by helper:  Adjust clothing prior to toileting, Performs perineal hygiene, Adjust clothing after toileting Toileting Assistive Devices: Grab bar or rail  Toileting assist Assist level: Two helpers   Transfers Chair/bed transfer   Chair/bed transfer method: Stand pivot, Squat pivot Chair/bed transfer assist level: Maximal assist (Pt 25 - 49%/lift and lower) Chair/bed transfer assistive device: Armrests, Patent attorney     Max distance: 25 Assist level: Moderate assist (Pt 50 - 74%)   Wheelchair   Type: Manual Max wheelchair distance: 50 Assist Level: Total assistance (Pt < 25%)   Cognition Comprehension Comprehension assist level: Understands basic 50 - 74% of the time/ requires cueing 25 - 49% of the time  Expression Expression assist level: Expresses basic 50 - 74% of the time/requires cueing 25 - 49% of the time. Needs to repeat parts of sentences.  Social Interaction Social Interaction assist level: Interacts appropriately 25 - 49% of time - Needs frequent redirection.  Problem Solving Problem solving assist level: Solves basic 25 - 49% of the time - needs direction more than half the time to initiate, plan or complete simple activities  Memory Memory assist level: Recognizes or recalls less than 25% of the time/requires cueing greater than 75% of the time   Medical Problem List and Plan: 1.  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                       -continue CIR therapies  -team conf today 2.  DVT Prophylaxis/Anticoagulation: Mechanical: Sequential compression devices, below knee Bilateral lower extremities, lovenox 40 qd 3. Pain Management: tylenol and percocet prn. Limit narcs as possible.  -headaches are persistent   -continue low dose topamax (Started by PA yesterday) 4. Mood: team will follow for ego support. Remains confused at present. Provide consistent routine to assist with anxiety 5. Neuropsych: This patient is not capable of making decisions on his own behalf.  -dc pm trazodone now that topamax added 6. Skin/Wound Care: local pressure relief, elevate lower ext                       -optimize nutrition 7. Fluids/Electrolytes/Nutrition:   -K+ 3.4 today  -repleting K+, continue at bid              -follow I's and O's daily 8. Urine retention: removed foley.                       -encourage OOB to void/check PVR's, I and O caths prn                       -Ucx with 30k GNR   -short course of cipro (3 days) 9. Fluid overload- lasix 60mg   BID--adjust regimen as indicated                       -daily weights                       -follow I and O's 10. DM2- cbg's still elevated                       -lantus-- increase to 10u bid (on 15u at home)                       -  SSI    11. HTN:   bp's trending up                       -continue normodyne, hydralazine, lasix, catapres tabs   -increased catapres to 0.2mg  tid                       -follow and adjust regimen as indicated with increased activity 12. Seizure proph: keppra 500mg  bid 13. ABLA--hgb trending up  -10.6 saturday   LOS (Days) 4 A FACE TO FACE EVALUATION WAS PERFORMED  Ernest [name] T 04/30/2016 8:29 AM

## 2016-04-30 NOTE — Progress Notes (Signed)
Recreational Therapy Session Note  Patient Details  Name: Ernest [name] MRN: [id number] Date of Birth: 1955-12-19 Today's Date: 04/30/2016  Pt placed on hold for TR services at this time.  Will continue to monitor through team.  Leanna Hamid 04/30/2016, 12:07 PM

## 2016-04-30 NOTE — IPOC Note (Signed)
Overall Plan of Care [facility]) Patient Details Name: Ernest [name] MRN: [id number] DOB: Dec 16, 1955  Admitting Diagnosis: RT ICH  Hospital Problems: Principal Problem:   Nontraumatic cortical hemorrhage of right cerebral hemisphere [facility]) Active Problems:   Essential hypertension   Poorly controlled type 2 diabetes mellitus (HCC)   Left-sided neglect     Functional Problem List: Nursing Skin Integrity, Medication Management, Edema, Endurance, Motor, Safety, Bowel, Bladder, Pain, Perception, Sensory, Nutrition  PT Balance, Edema, Endurance, Motor, Pain, Safety, Sensory  OT Balance, Cognition, Endurance, Motor, Perception, Safety, Sensory  SLP Cognition, Nutrition, Safety, Perception  TR         Basic ADL's: OT Grooming, Dressing, Bathing, Toileting     Advanced  ADL's: OT       Transfers: PT Bed Mobility, Bed to Chair, Car, Occupational psychologist, Research scientist (life sciences): PT Ambulation, Psychologist, prison and probation services, Stairs     Additional Impairments: OT Fuctional Use of Upper Extremity (LUE)  SLP Swallowing, Social Cognition   Social Interaction, Problem Solving, Memory, Attention, Awareness  TR      Anticipated Outcomes Item Anticipated Outcome  Self Feeding n/a  Swallowing  supervision for least restrictive diet   Basic self-care  grooming and UB dressing setup assist; LB dressing and bathing min assist  Toileting  min assist   Bathroom Transfers min assist  Bowel/Bladder  Pt will manage bowel and bladder with mod assist   Transfers  min A  Locomotion  min A gait  Communication  mod I  Cognition  min A for basic  Pain  Pt will rate pain at 4 or less on a scale of 0-10.   Safety/Judgment  Pt will remain free of falls and injury with min assist    Therapy Plan: PT Intensity: Minimum of 1-2 x/day ,45 to 90 minutes PT Frequency: 5 out of 7 days PT Duration Estimated Length of Stay: 16-20 days OT Intensity: Minimum of 1-2 x/day, 45 to 90 minutes OT Frequency:  5 out of 7 days OT Duration/Estimated Length of Stay: 16-22 days SLP Intensity: Minumum of 1-2 x/day, 30 to 90 minutes SLP Frequency: 3 to 5 out of 7 days SLP Duration/Estimated Length of Stay: 21 days       Team Interventions: Nursing Interventions Patient/Family Education, Bladder Management, Bowel Management, Disease Management/Prevention, Pain Management, Medication Management, Cognitive Remediation/Compensation, Skin Care/Wound Management, Discharge Planning, Dysphagia/Aspiration Precaution Training  PT interventions Ambulation/gait training, Community reintegration, DME/adaptive equipment instruction, Neuromuscular re-education, Stair training, UE/LE Strength taining/ROM, Wheelchair propulsion/positioning, UE/LE Coordination activities, Therapeutic Activities, Pain management, Functional electrical stimulation, Discharge planning, Warden/ranger, Cognitive remediation/compensation, Functional mobility training, Patient/family education, Splinting/orthotics, Therapeutic Exercise  OT Interventions Warden/ranger, Cognitive remediation/compensation, Community reintegration, Discharge planning, Functional mobility training, Patient/family education, Neuromuscular re-education, Self Care/advanced ADL retraining, Therapeutic Activities, Therapeutic Exercise, UE/LE Strength taining/ROM, UE/LE Coordination activities, Splinting/orthotics, Visual/perceptual remediation/compensation, Wheelchair propulsion/positioning  SLP Interventions Cognitive remediation/compensation, Dysphagia/aspiration precaution training, Financial trader, Environmental controls, Patient/family education, Functional tasks  TR Interventions    SW/CM Interventions Discharge Planning, Psychosocial Support, Patient/Family Education    Team Discharge Planning: Destination: PT-Home ,OT- Home , SLP-Home Projected Follow-up: PT-Home health PT, OT-  Home health OT, SLP-Outpatient SLP Projected Equipment Needs:  PT-To be determined, OT- To be determined, SLP-None recommended by SLP Equipment Details: PT- , OT-  Patient/family involved in discharge planning: PT- Patient, Family member/caregiver,  OT-Patient, SLP-Family member/caregiver, Patient  MD ELOS: 16-22 days Medical Rehab Prognosis:  Excellent Assessment: The patient has been admitted for CIR therapies  with the diagnosis of right ICH. The team will be addressing functional mobility, strength, stamina, balance, safety, adaptive techniques and equipment, self-care, bowel and bladder mgt, patient and caregiver education, NMR, visual-perceptual awareness, pain mgt, sleep-wake, community reintegration, cognition, behavior. Goals have been set at min assist for basic mobility and self-care, cognition, mod I with communication. Pt with profound visual-spatial deficits which heavily impact short term goals.   Ranelle Oyster, MD, FAAPMR      See Team Conference Notes for weekly updates to the plan of care

## 2016-04-30 NOTE — Progress Notes (Signed)
   04/30/16 1345  Clinical Encounter Type  Visited With Health care provider  Referral From Social work  Chaplain responded to a consult.  Chaplain went to patient room.  Patient was asleep.  Chaplain conferred with Child psychotherapist who attempted to awaken him.  Patient did not wish to talk.  Chaplain shared another attempt could be made at another time.

## 2016-04-30 NOTE — Progress Notes (Signed)
Occupational Therapy Session Note  Patient Details  Name: Etsel Shed MRN: [id number] Date of Birth: 02-Apr-1956  Today's Date: 04/30/2016 OT Individual Time: 2025-4270 OT Individual Time Calculation (min): 60 min   Short Term Goals: Week 1:  OT Short Term Goal 1 (Week 1): Pt will complete toilet transfer with mod assist  OT Short Term Goal 2 (Week 1): Pt will visual scan environment to locate 3 items with min verbal prompts OT Short Term Goal 3 (Week 1): Pt will complete sit<>stand with min assist OT Short Term Goal 4 (Week 1): Pt will complete LB dressing with mod assist OT Short Term Goal 5 (Week 1): Pt will complete bathing with mod assist and moderate verbal cues for sequencing and completion  Skilled Therapeutic Interventions/Progress Updates:  Patient found seated in w/c on room phone. Pt willing to work with therapist and quickly got off phone. Pt with difficulty pushing button to end phone call. Therapist worked with patient on how to turn phone on/off, pushing square button on phone. Pt may benefit from more muscle memory education and encouragement to increase independence with tasks this way due to poor attention and decreased visual perception. Patient worked on calling wife (pt turned phone on and therapist dialed number) to ask her to bring in clothes for him to work on donning/doffing in therapy. Pt with no complaints of pain, but complaints of "getting bored and depressed" while sitting in room; notified RN of this.  Therapist assisted pt to sink and pt engaged in ADL retraining in sit to/from stand position (+2 present during dynamic standing for safety this session). Pt with no clothing, therefore donned two gowns, TEDs, and non-skid socks. Pt refused brushing his teeth, as he sated he already completed this grooming task this morning. Therapist propelled pt out of room to day room to work on visual perception, attention, and problem solving. At end of session, left pt seated in  w/c with quick release belt donned at nurses station. Left pt at nurses station to keep patient out of the room for awhile due to his comment on getting board and depressed. Skilled therapy focusing on attention, visual perception, ADL retraining, sit to/from stands, following commands, and overall activity tolerance/endurance.   Therapy Documentation Precautions:  Precautions Precautions: Fall Precaution Comments: Swallowing precautions Restrictions Weight Bearing Restrictions: No  See Function Navigator for Current Functional Status.  Therapy/Group: Individual Therapy  Edwin Cap , MS, OTR/L, CLT  04/30/2016, 12:29 PM

## 2016-04-30 NOTE — Progress Notes (Signed)
Speech Language Pathology Daily Session Note  Patient Details  Name: Ernest [name] MRN: [id number] Date of Birth: 08/02/1956  Today's Date: 04/30/2016 SLP Individual Time: 0902-1000 SLP Individual Time Calculation (min): 58 min   Short Term Goals:Week 1: SLP Short Term Goal 1 (Week 1): Pt to demonstrate general orientation x 4 with mod A. SLP Short Term Goal 2 (Week 1): Pt to demonstrate gaze shifting to midline with max A in 3/4 opportunities.   SLP Short Term Goal 3 (Week 1): Pt to tolerate trials of Dys 3 with adequate oral clearance with min A. SLP Short Term Goal 4 (Week 1): Pt to demonstrate basic problem solving during functional tasks with max A. SLP Short Term Goal 5 (Week 1): Pt to attend to task for 5 minutes with mod A.   Skilled Therapeutic Interventions:  Pt was seen for skilled ST targeting cognitive goals.  Pt was much clearer this morning in comparison to previous therapy session.  Pt was oriented to place with supervision question cues but required min assist verbal cues to orient to situation.  Pt required hand over hand assist to locate items needed for oral care due to visual acuity and scanning impairment.  Therapist facilitated the session with a basic sorting task to address sustained attention and visual scanning to midline.  Pt initially required max assist multimodal cues to sort items into groups of two by color; however with task simplification pt was able to reach into bucket of blocks on his left and place them on the right side of the table with mod assist verbal cues.  Improved visual scanning to midline was noted during task but did not appear to facilitate carryover during more familiar tasks.  Pt was returned to room and left in wheelchair with quick release belt donned and call bell left within reach.  Continue per current plan of care.    Function:  Eating Eating                Cognition Comprehension Comprehension assist level: Understands basic 50  - 74% of the time/ requires cueing 25 - 49% of the time  Expression   Expression assist level: Expresses basic 75 - 89% of the time/requires cueing 10 - 24% of the time. Needs helper to occlude trach/needs to repeat words.  Social Interaction Social Interaction assist level: Interacts appropriately 50 - 74% of the time - May be physically or verbally inappropriate.  Problem Solving Problem solving assist level: Solves basic 25 - 49% of the time - needs direction more than half the time to initiate, plan or complete simple activities  Memory Memory assist level: Recognizes or recalls less than 25% of the time/requires cueing greater than 75% of the time    Pain Pain Assessment Pain Assessment: No/denies pain Pain Score: 3   Therapy/Group: Individual Therapy  Jessi Jessop, Melanee Spry 04/30/2016, 12:25 PM

## 2016-04-30 NOTE — Progress Notes (Signed)
Patient fell asleep after 2008 tylenol given; pt woke up yelling out in pain with c/o headache "It's throbbing, it's throbbing!" and rating pain 10/10. RN called on-call and received order for perc 1 tab q6h for 2 doses, per NP giving second dose only if needed.  Will continue to monitor patient.

## 2016-04-30 NOTE — Progress Notes (Signed)
Physical Therapy Session Note  Patient Details  Name: Ernest [name] MRN: [id number] Date of Birth: [birth year]  Today's Date: 04/30/2016 PT Individual Time: 4098-1191 and  1530-1604 PT Individual Time Calculation (min): 38 min and 34 min    Short Term Goals: Week 1:  PT Short Term Goal 1 (Week 1): Pt will perform gait with mod A 25' in controlled environment PT Short Term Goal 2 (Week 1): pt will perform functional transfers consistently at mod A PT Short Term Goal 3 (Week 1): pt will demo intellectual awareness with mod A  Skilled Therapeutic Interventions/Progress Updates:  Pt received in w/c with wife present to observe.  Pt more alert and oriented today but still with head turn to R, R gaze preference and L neglect.  No c/o pain; pt continued to report feeling cold and also perseverating on being "depressed" from being in the hospital for so long and wanting to go home.  Wife reports bringing clothing for pt to wear; pt requesting to don clothing.  Assisted pt with donning pants in sitting > standing and shirt in standing with max-total A and verbal cues to sequence with mod-max A for balance in standing.  Discussed with pt ELOS, goals of therapy and safety risks of going home now.  Pt verbalized understanding but continued to state how depressing being in the hospital was; discussed referral to Neuropsych with social worker.  In gym pt performed stair negotiation training for coordination, attention to L, sequencing, strength and balance training while negotiating 4 stairs x 2 reps with bilat UE support on rails and mod A for balance, weight shifting and safety with pt initially performing with step to sequence progressing to alternating sequence.  Performed gait with RW x 75' in controlled environment with therapist providing mod-max A at trunk to maintain upright trunk, weight shifting to L (pt with heavy R lateral lean), to navigate with RW and safety around obstacles due to vision/neglect.   Seated on mat attempted to assess pt's current field of vision and use of small, medium, large targets to assess scanning and use of pt holding object for proprioceptive awareness.  Pt with most vision in far R field with inability to maintain fixation on target as target moves towards midline.  Returned to w/c and to room to await second PT session after conference.  Second session: pt in w/c with social worker present to discuss rest breaks and D/C date.  Therapy recommending that pt sit in recliner in between therapy sessions for prolonged rest breaks due to fatigue.  Continued transfer training with focus on use of UE WB, proprioception and touch to locate surface transferring to and to sequence squat pivot. Verbalized and guided pt through squat pivots w/c <> recliner and w/c > bed to L and R side with use of UE placement on seat or arm rest and therapist initially providing max A for transfer progressing to min A with mod verbal cues to locate and sequence.  Pt requesting to use urinal; performed sit >stand and stood with RW with min-mod A while therapist obtained, positioned and removed urinal.  Performed sit > supine on bed with mod A.  Pt left in bed with bilat LE elevated for edema management and wife present to supervise; discussed with wife having family and friends sit on L side of pt to improve scanning and attention.  Therapy Documentation Precautions:  Precautions Precautions: Fall Precaution Comments: Swallowing precautions Restrictions Weight Bearing Restrictions: No Vital Signs: Therapy Vitals Temp:  97.7 F (36.5 C) Temp Source: Oral Pulse Rate: 69 Resp: 18 BP: (!) 154/81 Patient Position (if appropriate): Sitting Oxygen Therapy SpO2: 94 % O2 Device: Not Delivered Pain: Pain Assessment Pain Assessment: No/denies pain  See Function Navigator for Current Functional Status.   Therapy/Group: Individual Therapy  Edman Circle [facility] 04/30/2016, 4:12 PM

## 2016-05-01 ENCOUNTER — Inpatient Hospital Stay (HOSPITAL_COMMUNITY): Payer: Medicare Other | Admitting: Speech Pathology

## 2016-05-01 ENCOUNTER — Inpatient Hospital Stay (HOSPITAL_COMMUNITY): Payer: Medicare Other | Admitting: Physical Therapy

## 2016-05-01 ENCOUNTER — Inpatient Hospital Stay (HOSPITAL_COMMUNITY): Payer: Medicare Other | Admitting: Occupational Therapy

## 2016-05-01 LAB — GLUCOSE, CAPILLARY
GLUCOSE-CAPILLARY: 133 mg/dL — AB (ref 65–99)
GLUCOSE-CAPILLARY: 114 mg/dL — AB (ref 65–99)
Glucose-Capillary: 218 mg/dL — ABNORMAL HIGH (ref 65–99)
Glucose-Capillary: 123 mg/dL — ABNORMAL HIGH (ref 65–99)

## 2016-05-01 MED ORDER — METHYLPHENIDATE HCL 5 MG PO TABS
5.0000 mg | ORAL_TABLET | Freq: Two times a day (BID) | ORAL | Status: DC
  Administered 2016-05-01 – 2016-05-08 (×13): 5 mg via ORAL
  Filled 2016-05-01 (×15): qty 1

## 2016-05-01 NOTE — Progress Notes (Signed)
[facility] Individual Statement of Services  Patient Name:  Ernest [name]  Date:  05/01/2016  Welcome to the [facility].  Our goal is to provide you with an individualized program based on your diagnosis and situation, designed to meet your specific needs.  With this comprehensive rehabilitation program, you will be expected to participate in at least 3 hours of rehabilitation therapies Monday-Friday, with modified therapy programming on the weekends.  Your rehabilitation program will include the following services:  Physical Therapy (PT), Occupational Therapy (OT), Speech Therapy (ST), 24 hour per day rehabilitation nursing, Therapeutic Recreaction (TR), Neuropsychology, Case Management (Social Worker), Rehabilitation Medicine, Nutrition Services and Pharmacy Services  Weekly team conferences will be held on Wednesdays to discuss your progress.  Your Social Worker will talk with you frequently to get your input and to update you on team discussions.  Team conferences with you and your family in attendance may also be held.  Expected length of stay:  16 to 22 days  Overall anticipated outcome:  Minimal assistance  Depending on your progress and recovery, your program may change. Your Social Worker will coordinate services and will keep you informed of any changes. Your Social Worker's name and contact numbers are listed  below.  The following services may also be recommended but are not provided by the [facility]:   Driving Evaluations  Home Health Rehabiltiation Services  Outpatient Rehabilitation Services   Arrangements will be made to provide these services after discharge if needed.  Arrangements include referral to agencies that provide these services.  Your insurance has been verified to be:  Medicare [facility] primary doctor [facility]:  [facility]  Pertinent information will be shared with your doctor and your insurance  company.  Social Worker:  Staci Acosta, [name]  [phone] or ([phone]  Information discussed with and copy given to patient by: Elvera Lennox, 05/01/2016, 10:33 AM

## 2016-05-01 NOTE — Progress Notes (Signed)
Occupational Therapy Session Note  Patient Details  Name: Ernest [name] MRN: [id number] Date of Birth: 07-Feb-1956  Today's Date: 05/01/2016 OT Individual Time: 8250-0370 OT Individual Time Calculation (min): 57 min     Short Term Goals: Week 1:  OT Short Term Goal 1 (Week 1): Pt will complete toilet transfer with mod assist  OT Short Term Goal 2 (Week 1): Pt will visual scan environment to locate 3 items with min verbal prompts OT Short Term Goal 3 (Week 1): Pt will complete sit<>stand with min assist OT Short Term Goal 4 (Week 1): Pt will complete LB dressing with mod assist OT Short Term Goal 5 (Week 1): Pt will complete bathing with mod assist and moderate verbal cues for sequencing and completion  Skilled Therapeutic Interventions/Progress Updates:    Treatment session with focus on self feeding, hygiene, cognition, and bed mobility. Pt received in bed with breakfast tray just arrived at start of session. Pt stated that he was hungry and wanted to eat. Clinician initiated hand-over-hand assist with pt exerting 75% effort in RUE to bring food to mouth. Clinician also assisted with targeting in hand-mouth coordination for safety due to pt demonstrated R preference and L inattention.   Pt responded positively to max verbal cues for conversation and for clinician to ascertain pt preferences in sequencing of food types and drinks. Pt verbalized several times that he did not want to eat any longer yet still opened mouth and flexed neck toward food tray in addition to bringing food to mouth with hand-over-hand assist.   Pt demonstrates good bed mobility with max verbal cues for sequencing and weight shifting. Pt able to lift BLEs and grip trousers with BUEs with verbal cue delivery to assist with LB dressing and incontinence brief change.   Pt left in bed with alarm set and all needs in reach.   Therapy Documentation Precautions:  Precautions Precautions: Fall Precaution Comments:  Swallowing precautions Restrictions Weight Bearing Restrictions: No General:   Vital Signs:  Pain: Pain Assessment Pain Assessment: No/denies pain Pain Score: 0-No pain  See Function Navigator for Current Functional Status.   Therapy/Group: Individual Therapy  Leafy Kindle 05/01/2016, 11:43 AM

## 2016-05-01 NOTE — Progress Notes (Signed)
Social Work Assessment and Plan  Patient Details  Name: Ernest [name] MRN: [id number] Date of Birth: 1956-01-18  Today's Date: 04/30/2016  Problem List:  Patient Active Problem List   Diagnosis Date Noted  . Nontraumatic cortical hemorrhage of right cerebral hemisphere ([place]) 04/26/2016  . 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 Abscess of right arm 08/18/2013  . Essential hypertension 08/18/2013  . Poorly controlled type 2 diabetes mellitus ([place]) 08/18/2013  . Thrombocytopenia, unspecified ([place]) 08/18/2013   Past Medical History:  Past Medical History:  Diagnosis Date  . CHF (congestive heart failure) ([place])   . CVA (cerebral infarction)   . Diabetes mellitus without complication ([place])   . GERD (gastroesophageal reflux disease)   . Hypertension   . OSA (obstructive sleep apnea)   . Seizure disorder [facility])    Past Surgical History:  Past Surgical History:  Procedure Laterality Date  . APPLICATION OF WOUND VAC Right 08/20/2013   Procedure: VAC ASSISTED CLOSURE FOREARM;  Surgeon: Roseanne Kaufman, MD;  Location: [place];  Service: Orthopedics;  Laterality: Right;  . I&D EXTREMITY Right 08/18/2013   Procedure: IRRIGATION AND DEBRIDEMENT EXTREMITY;  Surgeon: Roseanne Kaufman, MD;  Location: [place];  Service: Orthopedics;  Laterality: Right;  . I&D EXTREMITY Right 08/20/2013   Procedure: IRRIGATION AND DEBRIDEMENT WASHOUT ARM;  Surgeon: Roseanne Kaufman, MD;  Location: [place];  Service: Orthopedics;  Laterality: Right;  . I&D EXTREMITY Right 08/22/2013   Procedure: IRRIGATION AND DEBRIDEMENT FOREARM COMPLEX WITH WOUND CLOSURE;  Surgeon: Roseanne Kaufman, MD;  Location: [place];  Service: Orthopedics;  Laterality: Right;   Social History:  reports that he has been smoking.  He does not have any smokeless tobacco history on file. He reports that he does not drink alcohol. His drug history is not on file.  Family / Support Systems Marital Status: Married Patient Roles: Spouse, Parent, Other (Comment)  (grandfather) Spouse/Significant Other: Sire Poet - spouse - [phone] (h); [phone] (m) Children: 2 sons and a dtr and a 94 y/o grandson Other Supports: Macon Large - sister - [phone] Anticipated Caregiver: Wife and sons Ability/Limitations of Caregiver: Wife currently works PT, but can take time off.  Son 73 yo not working.  84 yo in college, but home now for summer can assist. 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  His wife would be next of kin for decision making purposes.   Abuse/Neglect Physical Abuse: Denies Verbal Abuse: Denies Sexual Abuse: Denies Exploitation of patient/patient's resources: Denies Self-Neglect: Denies  Emotional Status Pt's affect, behavior and adjustment status: Pt is very open about missing his wife and children and wanting to be home.  He reports feeling down, but very focused on working hard and getting better and getting home. Recent Psychosocial Issues: none reported Psychiatric History: Pt reports some PTSD when he returned from [facility], but has worked through this.  He reports feeling "down" or "depressed" after this stroke and hospitalization and missing being with his family. Substance Abuse History: none reported  Patient / Family Perceptions, Expectations & Goals Pt/Family understanding of illness & functional limitations: Pt/wife report a good understanding of pt's condition and do not have any outstanding questions/concerns for medical team at this time. Premorbid pt/family roles/activities: Pt enjoys watching football with his sons and spending time with  family. Anticipated changes in roles/activities/participation: Pt wants  to resume his activities as soon as possible. Pt/family expectations/goals: Pt just wants to get home to be with his family.  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 Patient/Family Preliminary Plans: Pt's family plans to take care of pt at home. Barriers to Discharge: Steps, Self care Social Work Anticipated Follow Up Needs: HH/OP, Support Group Expected length of stay: 16 to 22 days  Clinical Impression CSW met with pt to introduce self and role of CSW, as well as to complete assessment, then later met wife to confirm information and meet her.  Pt's family is committed to caring for pt at home and would like for him to come home as soon as possible.  CSW tried to explain that to get pt as well as we can, he may need more time on Rehab than they had hoped for.  Wife seemed to understand this, but is still hopeful pt can return sooner than 3 weeks.  CSW explained that we would have family education closer to d/c and that [place] will order DME and arrange f/u therapies at that time, also.  Pt is very much missing being at home with his family.  CSW offered support prior to wife and grandson's visit and asked chaplain to visit pt.  CSW will also arrange neuropsychologist to see pt.  CSW will  continue to offer support and assist as needed.  Amoreena Neubert, Silvestre Mesi 04/30/2016, 10:57 AM

## 2016-05-01 NOTE — Progress Notes (Signed)
Social Work Patient ID: Ernest [name], male   DOB: May 28, 1956, 60 y.o.   MRN: [id number]   Nigel Sloop, [name] Social Worker Signed   Patient Care Conference Date of Service: 05/01/2016  8:43 AM      Hide copied text Hover for attribution information Inpatient RehabilitationTeam Conference and Plan of Care Update Date: 04/30/2016   Time: 3:00 PM      Patient Name: Ernest [name]      Medical Record Number: [id number]  Date of Birth: September 25, 1956 Sex: Male         Room/Bed: 4W15C/4W15C-01 Payor Info: Payor: MEDICARE / Plan: MEDICARE PART A AND B / Product Type: *No Product type* /     Admitting Diagnosis: RT ICH  Admit Date/Time:  04/26/2016  2:24 PM Admission Comments: No comment available    Primary Diagnosis:  Nontraumatic cortical hemorrhage of right cerebral hemisphere [facility]) Principal Problem: Nontraumatic cortical hemorrhage of right cerebral hemisphere [facility])       Patient Active Problem List    Diagnosis Date Noted  . 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 Abscess of right arm 08/18/2013  . Essential hypertension 08/18/2013  . Poorly controlled type 2 diabetes mellitus (HCC) 08/18/2013  . Thrombocytopenia, unspecified (H[facility]) 08/18/2013      Expected Discharge Date: Expected Discharge Date: 05/17/16   Team Members Present: Physician leading conference: Dr. Faith Rogue Social Worker Present: Dossie Der, [name] Nurse Present: Ronny Bacon, RN PT Present: Karolee Stamps, Varney Biles, PT OT Present: Other (comment) Edwin Cap) SLP Present: Jackalyn Lombard, SLP PPS Coordinator present : Edson Snowball, PT       Current Status/Progress Goal Weekly Team Focus  Medical   Right ICH requiring crani, severe left neglect. persistent headaches. mental status improving  improve awareness and insight  headache/sleep/safety/nutrition   Bowel/Bladder   cont/incont x2 (flucuates); LBM 8/1  Mod Assist   Attempt cont episodes with timed toilet   Swallow/Nutrition/ Hydration   Dys 2, thin liquids   supervision   use of swallowing precautions, trials of upgraded textures per improved mentation    ADL's   Mod to Max A overall due to inattention and perseveration   Min A transfers, toileting, LB dressing, standing; Supervision sitting, UB dressing, grooming  Attention to task, safety awareness, activity tolerance, functional transfers    Mobility   mod-max A overall  supervision-min A   orientation, attention and visual scanning, safety, balance, gait   Communication             Safety/Cognition/ Behavioral Observations moderately severe cognitive deficits   min assist   orientation, sustained attention, visual scanning to the left  of midline    Pain   C/o headache intermittent  < 4 on 0-10 pain scale  Monitor and treat pain as therapy begins   Skin   Abrasions to penis; BLE edema 2-3+  No new skin breakdown while on Rehab  Continue monitoring skin qshift     Rehab Goals Patient on target to meet rehab goals: Yes Rehab Goals Revised: none - pt's first conference *See Care Plan and progress notes for long and short-term goals.   Barriers to Discharge: profound cognitive and visual-spatial awraeness   Possible Resolutions to Barriers:  ongoing cognitive/behavioral mgt, family education   Discharge Planning/Teaching Needs:  Pt plans to return home with his wife, sons, and sister-in-law/brother-in-law to assist.  Pt's family can come in for family education closer to d/c.  Team Discussion:  Goals-supervision/min assist mostly min assist level. Spacing out therapies and will build in nap for pt due to exhausted and extremely fatigued after therapy session. Poor appetite MD adding topoamax should help appetite. Inattention, visual and cognitive deficits limit him in therapy. RN to begin wrapping leg to help with swelling. Is clearing with cognition.   Revisions to Treatment Plan:  None      Continued Need for Acute Rehabilitation Level of Care: The patient requires daily medical management by a physician with specialized training in physical medicine and rehabilitation for the following conditions: Daily direction of a multidisciplinary physical rehabilitation program to ensure safe treatment while eliciting the highest outcome that is of practical value to the patient.: Yes Daily medical management of patient stability for increased activity during participation in an intensive rehabilitation regime.: Yes Daily analysis of laboratory values and/or radiology reports with any subsequent need for medication adjustment of medical intervention for : Post surgical problems;Neurological problems   Leza Apsey, Vista Deck 05/01/2016, 10:05 AM     Revision History   Date/Time User Provider Type Action  05/01/2016 10:05 AM Nigel Sloop, [name] Social Worker Sign  05/01/2016  8:46 AM Lucy Chris, [name] Social Worker [employer] Details Report

## 2016-05-01 NOTE — Patient Care Conference (Signed)
Inpatient RehabilitationTeam Conference and Plan of Care Update Date: 04/30/2016   Time: 3:00 PM    Patient Name: Ernest [name]      Medical Record Number: [id number]  Date of Birth: [birth year] Sex: Male         Room/Bed: 4W15C/4W15C-01 Payor Info: Payor: MEDICARE / Plan: MEDICARE PART A AND B / Product Type: *No Product type* /    Admitting Diagnosis: RT ICH  Admit Date/Time:  04/26/2016  2:24 PM Admission Comments: No comment available   Primary Diagnosis:  Nontraumatic cortical hemorrhage of right cerebral hemisphere [facility]) Principal Problem: Nontraumatic cortical hemorrhage of right cerebral hemisphere [facility])  Patient Active Problem List   Diagnosis Date Noted  . 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 Abscess of right arm 08/18/2013  . Essential hypertension 08/18/2013  . Poorly controlled type 2 diabetes mellitus (HCC) 08/18/2013  . Thrombocytopenia, unspecified (HCC) 08/18/2013    Expected Discharge Date: Expected Discharge Date: 05/17/16  Team Members Present: Physician leading conference: Dr. Faith Rogue Social Worker Present: Dossie Der, [name] Nurse Present: Ronny Bacon, RN PT Present: Karolee Stamps, Varney Biles, PT OT Present: Other (comment) Edwin Cap) SLP Present: Jackalyn Lombard, SLP PPS Coordinator present : Edson Snowball, PT     Current Status/Progress Goal Weekly Team Focus  Medical   Right ICH requiring crani, severe left neglect. persistent headaches. mental status improving  improve awareness and insight  headache/sleep/safety/nutrition   Bowel/Bladder   cont/incont x2 (flucuates); LBM 8/1  Mod Assist  Attempt cont episodes with timed toilet   Swallow/Nutrition/ Hydration   Dys 2, thin liquids   supervision   use of swallowing precautions, trials of upgraded textures per improved mentation    ADL's   Mod to Max A overall due to inattention and perseveration   Min A  transfers, toileting, LB dressing, standing; Supervision sitting, UB dressing, grooming  Attention to task, safety awareness, activity tolerance, functional transfers    Mobility   mod-max A overall  supervision-min A   orientation, attention and visual scanning, safety, balance, gait   Communication             Safety/Cognition/ Behavioral Observations  moderately severe cognitive deficits   min assist   orientation, sustained attention, visual scanning to the left  of midline    Pain   C/o headache intermittent  < 4 on 0-10 pain scale  Monitor and treat pain as therapy begins   Skin   Abrasions to penis; BLE edema 2-3+  No new skin breakdown while on Rehab  Continue monitoring skin qshift    Rehab Goals Patient on target to meet rehab goals: Yes Rehab Goals Revised: none - pt's first conference *See Care Plan and progress notes for long and short-term goals.  Barriers to Discharge: profound cognitive and visual-spatial awraeness    Possible Resolutions to Barriers:  ongoing cognitive/behavioral mgt, family education    Discharge Planning/Teaching Needs:  Pt plans to return home with his wife, sons, and sister-in-law/brother-in-law to assist.  Pt's family can come in for family education closer to d/c.   Team Discussion:  Goals-supervision/min assist mostly min assist level. Spacing out therapies and will build in nap for pt due to exhausted and extremely fatigued after therapy session. Poor appetite MD adding topoamax should help appetite. Inattention, visual and cognitive deficits limit him in therapy. RN to begin wrapping leg to help with swelling. Is clearing with cognition.   Revisions to Treatment  Plan:  None   Continued Need for Acute Rehabilitation Level of Care: The patient requires daily medical management by a physician with specialized training in physical medicine and rehabilitation for the following conditions: Daily direction of a multidisciplinary physical  rehabilitation program to ensure safe treatment while eliciting the highest outcome that is of practical value to the patient.: Yes Daily medical management of patient stability for increased activity during participation in an intensive rehabilitation regime.: Yes Daily analysis of laboratory values and/or radiology reports with any subsequent need for medication adjustment of medical intervention for : Post surgical problems;Neurological problems  Sagrario Lineberry, Vista Deck 05/01/2016, 10:05 AM

## 2016-05-01 NOTE — Progress Notes (Signed)
Speech Language Pathology Daily Session Note  Patient Details  Name: Sanjiv Petit MRN: [id number] Date of Birth: May 31, 1956  Today's Date: 05/01/2016 SLP Individual Time: 8756-4332 SLP Individual Time Calculation (min): 55 min   Short Term Goals: Week 1: SLP Short Term Goal 1 (Week 1): Pt to demonstrate general orientation x 4 with mod A. SLP Short Term Goal 2 (Week 1): Pt to demonstrate gaze shifting to midline with max A in 3/4 opportunities.   SLP Short Term Goal 3 (Week 1): Pt to tolerate trials of Dys 3 with adequate oral clearance with min A. SLP Short Term Goal 4 (Week 1): Pt to demonstrate basic problem solving during functional tasks with max A. SLP Short Term Goal 5 (Week 1): Pt to attend to task for 5 minutes with mod A.   Skilled Therapeutic Interventions:   Skilled treatment session focused on addressing dysphagia and cognition goals. SLP facilitated session by providing set-up assist and hand over hand assist for the initiation of each sip of thin liquid via cup.  SLP was able to faded hand over hand to tactile cues for sips; however, if cup was placed on the table between sips hand over hand assist was required to initiate each self-feeding attempt.  Patient required Total assist to recall no straw restriction and requested one frequently during session, likely due to recall and perseveration.  Patient also perseverated on going back to bed throughout session, but was redirectable to participate for various tasks with rest breaks.  SLP also facilitated session with a basic sorting task to address sustained attention and visual scanning to midline. Patient Total hand over hand assist faded to Max assist multimodal cues to locate box at midline and retrieve blocks from it and place them on the right side of the table.  Patient required constant multimodal cues to scan to left but no gaze shift to midline was observed.  Continue with current plan of care.     Function:  Eating Eating   Modified Consistency Diet: Yes Eating Assist Level: Hand over hand assist   Eating Set Up Assist For: Opening containers;Cutting food       Cognition Comprehension Comprehension assist level: Understands basic 25 - 49% of the time/ requires cueing 50 - 75% of the time  Expression   Expression assist level: Expresses basic 25 - 49% of the time/requires cueing 50 - 75% of the time. Uses single words/gestures.  Social Interaction Social Interaction assist level: Interacts appropriately 25 - 49% of time - Needs frequent redirection.  Problem Solving Problem solving assist level: Solves basic less than 25% of the time - needs direction nearly all the time or does not effectively solve problems and may need a restraint for safety  Memory Memory assist level: Recognizes or recalls less than 25% of the time/requires cueing greater than 75% of the time    Pain Pain Assessment Pain Assessment: No/denies pain  Therapy/Group: Individual Therapy  Charlane Ferretti., CCC-SLP [phone]  Diasha Castleman 05/01/2016, 12:16 PM

## 2016-05-01 NOTE — Progress Notes (Signed)
Physical Therapy Session Note  Patient Details  Name: Ernest [name] MRN: [id number] Date of Birth: 08-03-1956  Today's Date: 05/01/2016 PT Individual Time: 1000-1100 PT Individual Time Calculation (min): 60 min    Short Term Goals: Week 1:  PT Short Term Goal 1 (Week 1): Pt will perform gait with mod A 25' in controlled environment PT Short Term Goal 2 (Week 1): pt will perform functional transfers consistently at mod A PT Short Term Goal 3 (Week 1): pt will demo intellectual awareness with mod A  Skilled Therapeutic Interventions/Progress Updates:   Pt received seated in w/c with quick release belt intact; pt lethargic and difficult to arouse, poor eye contact with therapist. Pt perseverative on "I'm sleepy", and requires mod encouragement to participate, give motivation with rest breaks to improve participation. Standing in parallel bars, marching in place 2x30 with min guard. Gait x26' with rail and modA from therapist with +2 w/c follow for safety. Second trial with no w/c follow, x50' including turns and pt using rail in hallway with both UEs as appropriate. Has difficulty maintaining LUE near body when ambulating with tendency to drag hand behind him d/t inattention. Stairs 1x8 on 3" height steps with B handrails and minA; cues for hand placement, L attention and sequencing. Pt frequently sitting in w/c before completing full turn to line up with chair despite cueing. Stand pivot transfer w/c <>nustep modA. Nustep x6 min for L attention, reciprocal stepping, strengthening and aerobic endurance. Stand pivot to return to bed min/modA. Sit >supine with S and cues for alignment in bed. Left supine in bed with alarm intact and all needs in reach at end of session.   Therapy Documentation Precautions:  Precautions Precautions: Fall Precaution Comments: Swallowing precautions Restrictions Weight Bearing Restrictions: No Pain: Pain Assessment Pain Assessment: No/denies pain Pain Score: 0-No  pain   See Function Navigator for Current Functional Status.   Therapy/Group: Individual Therapy  Vista Lawman 05/01/2016, 10:44 AM

## 2016-05-01 NOTE — Progress Notes (Signed)
[place] PHYSICAL MEDICINE & REHABILITATION     PROGRESS NOTE    Subjective/Complaints: RN reports better sleep. Pt denies headache. Attempting to work with OT but appears quite sleepy. Having a difficult time keeping eyes open   ROS limited by mental status  Objective: Vital Signs: Blood pressure (!) 169/77, pulse 84, temperature 98.5 F (36.9 C), temperature source Oral, resp. rate 17, height  (1.753 m), weight 100.8 kg (222 lb 3.2 oz), SpO2 99 %. No results found.  Recent Labs  04/30/16 0613  WBC 6.1  HGB 10.7*  HCT 33.0*  PLT 138*    Recent Labs  04/30/16 0613  NA 133*  K 3.4*  CL 99*  GLUCOSE 135*  BUN 13  CREATININE 1.03  CALCIUM 8.6*   CBG (last 3)   Recent Labs  04/30/16 1643 04/30/16 2244 05/01/16 0701  GLUCAP 114* 153* 133*    Wt Readings from Last 3 Encounters:  05/01/16 100.8 kg (222 lb 3.2 oz)  04/26/16 117 kg (258 lb)  08/18/13 89.1 kg (196 lb 6.4 oz)    Physical Exam:  Constitutional: He appears well-developed and well-nourished.  HENT:  Head: Normocephalic and atraumatic.  Eyes: Conjunctivae and EOM are normal.  Neck: Normal range of motion. Neck supple. No JVD present. No tracheal deviation present. No thyromegaly present.  Cardiovascular: Normal rate and regular rhythm.   Respiratory: Effort normal and breath sounds normal. No respiratory distress. He has no wheezes. He has no rales.  GI: Soft. Bowel sounds are normal. He exhibits no distension. There is no tenderness.  Musculoskeletal: He exhibits edema (1+ to 2+ pitting edema LLE--improving).  Neurological: He is alert.  Still with poor insight and awareness.   Left inattention---can be cued to look left. Left visual field deficits.   Moves LUE and LLE grossly 3 to 4/5 but very inconsistent. Senses pain when pinched on left side. RUE and RLE 5/5  Skin: Skin is warm and dry. No rash noted. No erythema.  Psychiatric:  Anxious at times   Assessment/Plan: 1. Cognitive,  mobility, and visual-spatial deficits secondary to right parietal-temporal ICH which require 3+ hours per day of interdisciplinary therapy in a comprehensive inpatient rehab setting. Physiatrist is providing close team supervision and 24 hour management of active medical problems listed below. Physiatrist and rehab team continue to assess barriers to discharge/monitor patient progress toward functional and medical goals.  Function:  Bathing Bathing position   Position: Wheelchair/chair at sink  Bathing parts Body parts bathed by patient: Left arm, Chest, Abdomen, Front perineal area, Right upper leg, Left upper leg, Right arm Body parts bathed by helper: Right lower leg, Left lower leg, Back  Bathing assist Assist Level: Touching or steadying assistance(Pt > 75%)      Upper Body Dressing/Undressing Upper body dressing   What is the patient wearing?: Hospital gown                Upper body assist        Lower Body Dressing/Undressing Lower body dressing   What is the patient wearing?: Non-skid slipper socks, Ted Hose           Non-skid slipper socks- Performed by helper: Don/doff right sock, Don/doff left sock               TED Hose - Performed by helper: Don/doff right TED hose, Don/doff left TED hose  Lower body assist        Toileting Toileting Toileting activity did not  occur: N/A   Toileting steps completed by helper: Adjust clothing prior to toileting, Performs perineal hygiene, Adjust clothing after toileting Toileting Assistive Devices: Grab bar or rail  Toileting assist Assist level: Two helpers   Transfers Chair/bed transfer   Chair/bed transfer method: Squat pivot Chair/bed transfer assist level: Moderate assist (Pt 50 - 74%/lift or lower) Chair/bed transfer assistive device: Armrests     Locomotion Ambulation     Max distance: 75 Assist level: Moderate assist (Pt 50 - 74%)   Wheelchair   Type: Manual Max wheelchair distance: 50 Assist  Level: Total assistance (Pt < 25%)  Cognition Comprehension Comprehension assist level: Understands basic 50 - 74% of the time/ requires cueing 25 - 49% of the time  Expression Expression assist level: Expresses basic 50 - 74% of the time/requires cueing 25 - 49% of the time. Needs to repeat parts of sentences.  Social Interaction Social Interaction assist level: Interacts appropriately 25 - 49% of time - Needs frequent redirection.  Problem Solving Problem solving assist level: Solves basic 25 - 49% of the time - needs direction more than half the time to initiate, plan or complete simple activities  Memory Memory assist level: Recognizes or recalls less than 25% of the time/requires cueing greater than 75% of the time   Medical Problem List and Plan: 1.  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                       -continue CIR therapies  -substantial visual-perceptual deficits  -cognition showing some improvement 2.  DVT Prophylaxis/Anticoagulation: Mechanical: Sequential compression devices, below knee Bilateral lower extremities, lovenox 40 qd 3. Pain Management: tylenol and percocet prn. Limit narcs as possible.  -headaches are persistent   -continue low dose topamax --need to be careful that this doesn't contribute to sedation. 4. Mood: team will follow for ego support. Remains confused at present. Provide consistent routine to assist with anxiety 5. Neuropsych: This patient is not capable of making decisions on his own behalf.  -dc'ed pm trazodone now that topamax added  -add ritalin for arousal and to improve attention 6. Skin/Wound Care: local pressure relief, elevate lower ext                       -optimize nutrition 7. Fluids/Electrolytes/Nutrition:   -K+ 3.4    -repleting K+, continue at bid              -follow I's and O's daily 8. Urine retention: removed foley.                        -encourage OOB to void/check PVR's, I and O caths prn                       -Ucx with 30k klebsiella   -short course of cipro (3 days) 9. Fluid overload- lasix 60mg  BID--adjust regimen as indicated                       -daily weights                       -follow I and O's 10. DM2- cbg's still elevated              -lantus-- increased to 10u bid (on 15u at home)  -  sugars improving although po intake poor  -SSI    11. HTN:   bp's trending up                       -continue normodyne, hydralazine, lasix, catapres tabs   -increased catapres to 0.2mg  tid                       -follow and adjust regimen as indicated with increased activity 12. Seizure proph: keppra 500mg  bid 13. ABLA--hgb trending up  -10.6 saturday   LOS (Days) 5 A FACE TO FACE EVALUATION WAS PERFORMED  SWARTZ,ZACHARY T 05/01/2016 9:09 AM

## 2016-05-01 NOTE — Progress Notes (Signed)
Physical Therapy Session Note  Patient Details  Name: Ernest [name] MRN: [id number] Date of Birth: May 16, 1956  Today's Date: 05/01/2016 PT Individual Time: 1500-1530 PT Individual Time Calculation (min): 30 min    Short Term Goals: Week 1:  PT Short Term Goal 1 (Week 1): Pt will perform gait with mod A 25' in controlled environment PT Short Term Goal 2 (Week 1): pt will perform functional transfers consistently at mod A PT Short Term Goal 3 (Week 1): pt will demo intellectual awareness with mod A  Skilled Therapeutic Interventions/Progress Updates:     Patient in bed, requiring max encouragement and more than reasonable time to participate as he continued to pull blankets back over his face/body, stating, "I'll do it tomorrow," and perseverating on being tired. Patient kept eyes closed and maintained strong R gaze preference throughout session. Therapist provided tactile cues to initiate bed mobility with verbal cues for technique and mod A overall. Patient performed squat pivot transfers with mod A overall and hand over hand assist to place UE on arm rest to guide transfer and stand pivot transfer with mod-max A without device. Instructed patient in gait training using RW with mod A overall to control RW but patient ambulated approx 10 ft to nearest therapy mat, sat down, and transferred to supine. Patient noted to be incontinent of bowel and bladder and returned to room. Performed static standing using RW with steady assist while NT performed hygiene with total A. After threading BLE, patient stood and pulled pants over hips with mod A for balance due to posterior lean. Patient transferred sit > supine with min A overall and left semi reclined in bed with soft call bell on chest and bed alarm on.    Therapy Documentation Precautions:  Precautions Precautions: Fall Precaution Comments: Swallowing precautions Restrictions Weight Bearing Restrictions: No Pain: Pain Assessment Pain Assessment:  No/denies pain   See Function Navigator for Current Functional Status.   Therapy/Group: Individual Therapy  Kerney Elbe 05/01/2016, 4:46 PM

## 2016-05-01 NOTE — Progress Notes (Signed)
Social Work Patient ID: Ernest [name], male   DOB: [birth year], 60 y.o.   MRN: [id number]   CSW met with pt, pt's wife, and pt's 86 y/o grandson 04-30-16 to update them on team conference discussion.  Pt has min assist level goals overall and will need someone with him 24/7, especially for visual and cognitive deficits.  Pt's wife stated they have lots of help and plan to care for him at home.  Pt's targeted d/c date is the same day that pt's son is planning to return to [employer] for school.  Wife seemed disappointed pt would need that much time.  PT, Raylene Everts, came in at the end of CSW's visit and she explained that date could be changed, but to get pt to the highest level they can, he would need to stay until 05-17-16.  Both PT and this CSW assured wife that we will continue to assess pt's progress and will work with them on the date, if need be, with the understanding that pt would likely need more care.  She seemed to understand this and we will revisit it at next week's conference.  Pt very much wants to be reunited with his family, although they do visit daily.  CSW will continue to follow and assist as needed.

## 2016-05-02 ENCOUNTER — Inpatient Hospital Stay (HOSPITAL_COMMUNITY): Payer: Medicare Other | Admitting: Occupational Therapy

## 2016-05-02 ENCOUNTER — Inpatient Hospital Stay (HOSPITAL_COMMUNITY): Payer: Medicare Other

## 2016-05-02 ENCOUNTER — Inpatient Hospital Stay (HOSPITAL_COMMUNITY): Payer: Medicare Other | Admitting: Speech Pathology

## 2016-05-02 ENCOUNTER — Encounter (HOSPITAL_COMMUNITY): Payer: Medicare Other

## 2016-05-02 DIAGNOSIS — F4321 Adjustment disorder with depressed mood: Secondary | ICD-10-CM

## 2016-05-02 LAB — GLUCOSE, CAPILLARY
GLUCOSE-CAPILLARY: 149 mg/dL — AB (ref 65–99)
GLUCOSE-CAPILLARY: 99 mg/dL (ref 65–99)
Glucose-Capillary: 130 mg/dL — ABNORMAL HIGH (ref 65–99)
Glucose-Capillary: 141 mg/dL — ABNORMAL HIGH (ref 65–99)

## 2016-05-02 NOTE — Progress Notes (Signed)
Occupational Therapy Session Note  Patient Details  Name: Ernest [name] MRN: [id number] Date of Birth: Sep 27, 1956  Today's Date: 05/02/2016 OT Individual Time: 1586-8257 OT Individual Time Calculation (min): 31 min   Short Term Goals: Week 1:  OT Short Term Goal 1 (Week 1): Pt will complete toilet transfer with mod assist  OT Short Term Goal 2 (Week 1): Pt will visual scan environment to locate 3 items with min verbal prompts OT Short Term Goal 3 (Week 1): Pt will complete sit<>stand with min assist OT Short Term Goal 4 (Week 1): Pt will complete LB dressing with mod assist OT Short Term Goal 5 (Week 1): Pt will complete bathing with mod assist and moderate verbal cues for sequencing and completion  Skilled Therapeutic Interventions/Progress Updates:    Pt greeted in w/c with spouse present.  Pt reported being sleepy, requiring tactile cues to maintain alertness. OT instructed pt to track finger, unable to shift eyes past midline to scan to L. Pt required tactile cues to find handle of RW on L side, then stood w/ Min A. Pt ambulated ~30 ft with Min/Mod A, w/ cues for safety, technique, and walker management w/ strong veer to the L. Pt took rest break in w/c and brought to [place] room. Pt unable to locate red light towards midline, nor bottom left quadrant. Pt perseverative on where his wife was, and unable to sustain attention to participate in task. Pt was however able to repeat/verbalize directions given by OT "find the red lights" . Pt then ambulated ~10 feet w/ RW and Min-Mod A to head back to the room. Pt stated he was tired and impulsively began to sit, despite OT instruction. Second OT present and quickly provided pt with w/c. Pt propelled w/c short distance, but unable maintain forward momentum, running into wall on L side. Pt was returned to room, completed stand-pivot from w/c>recliner, and left with spouse present, safety belt in place, and needs met.   Therapy  Documentation Precautions:  Precautions Precautions: Fall Restrictions Weight Bearing Restrictions: No   Pain: Pain Assessment Pain Assessment: Faces Faces Pain Scale: Hurts little more Pain Type: Acute pain Pain Location: Head Pain Orientation: Right;Upper Pain Descriptors / Indicators: Headache Pain Intervention(s): Rest Multiple Pain Sites: No        See Function Navigator for Current Functional Status.  Therapy/Group: Individual Therapy  Dalphine Handing Sitton OTR/L 05/02/2016, 2:16 PM

## 2016-05-02 NOTE — Progress Notes (Addendum)
[place] PHYSICAL MEDICINE & REHABILITATION     PROGRESS NOTE    Subjective/Complaints: States head hurts but headaches are better. Just waking up. Wife at bedside.   ROS limited by mental status  Objective: Vital Signs: Blood pressure (!) 189/95, pulse 81, temperature 99 F (37.2 C), temperature source Oral, resp. rate 16, height 5\' 9"  (1.753 m), weight 97.5 kg (215 lb), SpO2 90 %. No results found.  Recent Labs  04/30/16 0613  WBC 6.1  HGB 10.7*  HCT 33.0*  PLT 138*    Recent Labs  04/30/16 0613  NA 133*  K 3.4*  CL 99*  GLUCOSE 135*  BUN 13  CREATININE 1.03  CALCIUM 8.6*   CBG (last 3)   Recent Labs  05/01/16 1654 05/01/16 2050 05/02/16 0633  GLUCAP 123* 114* 130*    Wt Readings from Last 3 Encounters:  05/02/16 97.5 kg (215 lb)  04/26/16 117 kg (258 lb)  08/18/13 89.1 kg (196 lb 6.4 oz)    Physical Exam:  Constitutional: He appears well-developed and well-nourished.  HENT:  Head: Normocephalic and atraumatic.  Eyes: Conjunctivae and EOM are normal.  Neck: Normal range of motion. Neck supple. No JVD present. No tracheal deviation present. No thyromegaly present.  Cardiovascular: Normal rate and regular rhythm.   Respiratory: Effort normal and breath sounds normal. No respiratory distress. He has no wheezes. He has no rales.  GI: Soft. Bowel sounds are normal. He exhibits no distension. There is no tenderness.  Musculoskeletal: He exhibits edema (1+ to 2+ pitting edema LLE--improving).  Neurological: He is alert.  Still with poor insight and awareness.   Left inattention---can be cued to look left. Left visual field deficits.   Moves LUE and LLE grossly 3 to 4/5 but very inconsistent. Senses pain when pinched on left side. RUE and RLE 5/5  Skin: Skin is warm and dry. No rash noted. No erythema.  Psychiatric:  Anxious at times   Assessment/Plan: 1. Cognitive, mobility, and visual-spatial deficits secondary to right parietal-temporal ICH  which require 3+ hours per day of interdisciplinary therapy in a comprehensive inpatient rehab setting. Physiatrist is providing close team supervision and 24 hour management of active medical problems listed below. Physiatrist and rehab team continue to assess barriers to discharge/monitor patient progress toward functional and medical goals.  Function:  Bathing Bathing position   Position: Bed  Bathing parts Body parts bathed by patient: Left arm, Chest, Abdomen, Front perineal area, Right upper leg, Left upper leg, Right arm Body parts bathed by helper: Buttocks, Front perineal area  Bathing assist Assist Level:  (Max A )      Upper Body Dressing/Undressing Upper body dressing   What is the patient wearing?: Hospital gown                Upper body assist        Lower Body Dressing/Undressing Lower body dressing   What is the patient wearing?: Non-skid slipper socks, Ted Hose           Non-skid slipper socks- Performed by helper: Don/doff right sock, Don/doff left sock               TED Hose - Performed by helper: Don/doff right TED hose, Don/doff left TED hose  Lower body assist Assist for lower body dressing:  (Max A)      Toileting Toileting Toileting activity did not occur: N/A   Toileting steps completed by helper: Adjust clothing prior to toileting, Performs  perineal hygiene, Adjust clothing after toileting Toileting Assistive Devices: Grab bar or rail  Toileting assist Assist level: Two helpers   Transfers Chair/bed transfer   Chair/bed transfer method: Squat pivot Chair/bed transfer assist level: Moderate assist (Pt 50 - 74%/lift or lower) Chair/bed transfer assistive device: Armrests     Locomotion Ambulation     Max distance: 10 Assist level: Moderate assist (Pt 50 - 74%)   Wheelchair   Type: Manual Max wheelchair distance: 50 Assist Level: Dependent (Pt equals 0%)  Cognition Comprehension Comprehension assist level: Understands  basic 25 - 49% of the time/ requires cueing 50 - 75% of the time  Expression Expression assist level: Expresses basic 25 - 49% of the time/requires cueing 50 - 75% of the time. Uses single words/gestures.  Social Interaction Social Interaction assist level: Interacts appropriately 25 - 49% of time - Needs frequent redirection.  Problem Solving Problem solving assist level: Solves basic less than 25% of the time - needs direction nearly all the time or does not effectively solve problems and may need a restraint for safety  Memory Memory assist level: Recognizes or recalls less than 25% of the time/requires cueing greater than 75% of the time   Medical Problem List and Plan: 1.  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              -continue CIR therapies  -substantial visual-perceptual deficits  -spoke with wife regarding prognosis and current deficits 2.  DVT Prophylaxis/Anticoagulation: Mechanical: Sequential compression devices, below knee Bilateral lower extremities, lovenox 40 qd 3. Pain Management: tylenol and percocet prn. Limit narcs as possible.  -headaches are persistent   -continue low dose topamax --need to be careful that this doesn't contribute to sedation. 4. Mood: team will follow for ego support. Remains confused at present. Provide consistent routine to assist with anxiety 5. Neuropsych: This patient is not capable of making decisions on his own behalf.  -added ritalin for arousal and to improve attention 6. Skin/Wound Care: local pressure relief, elevate lower ext                       -optimize nutrition 7. Fluids/Electrolytes/Nutrition:   -K+ 3.4    -repleting K+, continue at bid              -follow I's and O's daily  -recheck bmet tomorrow. Also check prealbumin 8. Urine retention: removed foley.                       -encourage OOB to void/check PVR's, I and O caths  prn                       -Ucx with 30k klebsiella   -short course of cipro (3 days) 9. Fluid overload- lasix 60mg  BID--adjust regimen as indicated                       -daily weights (trending down)                       -follow I and O's 10. DM2- cbg's still elevated              -lantus-- increased to 10u bid (on 15u at home)  -sugars improving although po intake is inconsistent  -SSI    11. HTN:   bp's  trending up                       -continue normodyne, hydralazine, lasix, catapres tabs   -increased catapres to 0.2mg  tid                       -follow and adjust regimen as indicated with increased activity 12. Seizure proph: keppra  bid 13. ABLA--hgb trending up  -10.6 saturday   LOS (Days) 6 A FACE TO FACE EVALUATION WAS PERFORMED  Danarius Mcconathy T 05/02/2016 8:33 AM

## 2016-05-02 NOTE — Progress Notes (Signed)
Physical Therapy Note  Patient Details  Name: Ernest [name] MRN: [id number] Date of Birth: June 22, 1956 Today's Date: 05/02/2016  0800-0900, 60 min individual tx Pain: c/o leg soreness during gait, unable to rate  Pt extremely somnolent, with eyes closed for majority of session.  Wife present, and he responded better to her.  Bed mobility for donning pants in bed.  Bed mobility with min assist for supine> sit  Squat pivots with min/mod assist. Seated neuromuscular re-education via multimodal cues, hand over hand assistance for matching cards using L hand, board in front of him placedat midline due to inability to hold L gaze.  Nustep at level 4 x 20 cycles x 3 for increasing alertness and alternating reciprocal movements. Gait with RW x 15' with mod assist,  with pt sitting suddenly stating his legs hurt; x 10' with grocery cart also sitting suddenly; +2 assitance needed for w/c follow by wife.Pt left resting in w/c with quick release belt applied and all needs within reach. Wife present.   Tameika Heckmann 05/02/2016, 7:50 AM

## 2016-05-02 NOTE — Progress Notes (Signed)
Occupational Therapy Session Note  Patient Details  Name: Ernest [name] MRN: [id number] Date of Birth: 09/14/1956  Today's Date: 05/02/2016 OT Individual Time: 0569-7948 OT Individual Time Calculation (min): 57 min  and Today's Date: 05/02/2016 OT Missed Time: 18 Minutes Missed Time Reason: Patient fatigue     Short Term Goals: Week 1:  OT Short Term Goal 1 (Week 1): Pt will complete toilet transfer with mod assist  OT Short Term Goal 2 (Week 1): Pt will visual scan environment to locate 3 items with min verbal prompts OT Short Term Goal 3 (Week 1): Pt will complete sit<>stand with min assist OT Short Term Goal 4 (Week 1): Pt will complete LB dressing with mod assist OT Short Term Goal 5 (Week 1): Pt will complete bathing with mod assist and moderate verbal cues for sequencing and completion  Skilled Therapeutic Interventions/Progress Updates:    Treatment session with focus on visual attention, attention to task, and functional mobility. Pt received reclined in recliner reporting fatigue but able to be engaged in therapeutic activity.  Engaged in table top task with focus on sustained visual attention as well as scanning.  Pt required increased time and intermittent hand over hand as well as tapping cues to locate peg container in Rt visual field and then place specific colored pegs into pegboard at angle on table at midline.  Pt demonstrating difficulty with visual fixation on items, requiring max cues to locate even hand over hand to bring item into far Rt visual field with decreased fixation/attention on target.  Pt correctly locating 0/7 pegs by color and often guessing when naming color.  "Hunt and peck" when placing pegs in pegboard with no visual attention to task.  Pt perseverating on "where's Trudy?" and being tired.  Often requiring reassurance and redirection to attend to task.  Therapy Documentation Precautions:  Precautions Precautions: Fall Precaution Comments: Swallowing  precautions Restrictions Weight Bearing Restrictions: No General: General OT Amount of Missed Time: 18 Minutes Vital Signs: Therapy Vitals Temp: 98.4 F (36.9 C) Temp Source: Oral Pulse Rate: 77 Resp: 16 BP: (!) 177/87 Patient Position (if appropriate): Lying Oxygen Therapy SpO2: 100 % O2 Device: Not Delivered Pain: Pain Assessment Pain Assessment: Faces Faces Pain Scale: Hurts little more Pain Type: Acute pain Pain Location: Head Pain Orientation: Right;Upper Pain Descriptors / Indicators: Headache Pain Intervention(s): Rest Multiple Pain Sites: No  See Function Navigator for Current Functional Status.   Therapy/Group: Individual Therapy  Rosalio Loud 05/02/2016, 2:48 PM

## 2016-05-02 NOTE — Progress Notes (Signed)
Patient/Family refused Topamax. Will discontinue as HA better and wife concerned about medications sedating patient.

## 2016-05-02 NOTE — Progress Notes (Signed)
Speech Language Pathology Daily Session Note  Patient Details  Name: Ernest [name] MRN: [id number] Date of Birth: [birth year]  Today's Date: 05/02/2016 SLP Individual Time: 1000-1100 SLP Individual Time Calculation (min): 60 min   Short Term Goals: Week 1: SLP Short Term Goal 1 (Week 1): Pt to demonstrate general orientation x 4 with mod A. SLP Short Term Goal 2 (Week 1): Pt to demonstrate gaze shifting to midline with max A in 3/4 opportunities.   SLP Short Term Goal 3 (Week 1): Pt to tolerate trials of Dys 3 with adequate oral clearance with min A. SLP Short Term Goal 4 (Week 1): Pt to demonstrate basic problem solving during functional tasks with max A. SLP Short Term Goal 5 (Week 1): Pt to attend to task for 5 minutes with mod A.   Skilled Therapeutic Interventions:Pt seen for skilled SLP treatment to address cognitive and dysphagia goals. Pt tolerated trials of regular consistency with occasional verbal cueing for bite size. Pt required hand over hand assist and tactile cueing for head to approximate midline for intake of liquids. Pt required max A to maintain adequate eye opening/alertness for participation. Pt O x self, [place] and hospital at mod I, but required mod A for month. Mod A for basic problem solving in a functional task (hand washing). Spouse present for session.    Function:  Eating Eating   Modified Consistency Diet: Yes Eating Assist Level: Hand over hand assist;More than reasonable amount of time   Eating Set Up Assist For: Opening containers;Cutting food Helper Scoops Food on Utensil: Every scoop Helper Brings Food to Mouth: Every scoop   Cognition Comprehension Comprehension assist level: Understands basic 50 - 74% of the time/ requires cueing 25 - 49% of the time  Expression   Expression assist level: Expresses basic 50 - 74% of the time/requires cueing 25 - 49% of the time. Needs to repeat parts of sentences.  Social Interaction Social Interaction assist level:  Interacts appropriately 25 - 49% of time - Needs frequent redirection.  Problem Solving Problem solving assist level: Solves basic less than 25% of the time - needs direction nearly all the time or does not effectively solve problems and may need a restraint for safety  Memory Memory assist level: Recognizes or recalls less than 25% of the time/requires cueing greater than 75% of the time    Pain Pain Assessment Pain Assessment: No/denies pain   Therapy/Group: Individual Therapy  Rocky Crafts MA, CCC-SLP 05/02/2016, 11:29 AM

## 2016-05-03 ENCOUNTER — Inpatient Hospital Stay (HOSPITAL_COMMUNITY): Payer: Medicare Other | Admitting: Speech Pathology

## 2016-05-03 ENCOUNTER — Inpatient Hospital Stay (HOSPITAL_COMMUNITY): Payer: Medicare Other | Admitting: Occupational Therapy

## 2016-05-03 ENCOUNTER — Inpatient Hospital Stay (HOSPITAL_COMMUNITY): Payer: Medicare Other | Admitting: Physical Therapy

## 2016-05-03 LAB — GLUCOSE, CAPILLARY
GLUCOSE-CAPILLARY: 133 mg/dL — AB (ref 65–99)
GLUCOSE-CAPILLARY: 152 mg/dL — AB (ref 65–99)
Glucose-Capillary: 157 mg/dL — ABNORMAL HIGH (ref 65–99)
Glucose-Capillary: 128 mg/dL — ABNORMAL HIGH (ref 65–99)

## 2016-05-03 MED ORDER — DULOXETINE HCL 20 MG PO CPEP
40.0000 mg | ORAL_CAPSULE | Freq: Every day | ORAL | Status: DC
  Administered 2016-05-04 – 2016-05-15 (×11): 40 mg via ORAL
  Filled 2016-05-03 (×12): qty 2

## 2016-05-03 MED ORDER — WHITE PETROLATUM GEL
Status: AC
  Administered 2016-05-03: 11:00:00
  Filled 2016-05-03: qty 1

## 2016-05-03 NOTE — Progress Notes (Signed)
Physical Therapy Weekly Progress Note  Patient Details  Name: Ernest [name] MRN: [id number] Date of Birth: [birth year]  Beginning of progress report period: April 27, 2016 End of progress report period: May 03, 2016  Today's Date: 05/03/2016 PT Individual Time: 8003-4917 and 1400-1445 PT Individual Time Calculation (min): 44 min and 45 min   Patient has made slow progress and has met 2 of 3 short term goals.  Pt continues to require inconsistent amounts of physical assistance (min-mod-max A) for basic transfers, w/c mobility, gait and standing balance due to intermittent episodes of lethargy, altered mental status, significantly impaired vision and cognition.    Patient continues to demonstrate the following deficits: L hemiparesis, impaired vision, cognition, impaired activity tolerance/endurance, impaired motor planning/sequencing, impaired postural control, balance, gait and therefore will continue to benefit from skilled PT intervention to enhance overall performance with activity tolerance, balance, postural control, ability to compensate for deficits, functional use of  left upper extremity and left lower extremity, attention, awareness and coordination.  Patient progressing toward long term goals..  Continue plan of care.  PT Short Term Goals Week 1:  PT Short Term Goal 1 (Week 1): Pt will perform gait with mod A 25' in controlled environment PT Short Term Goal 1 - Progress (Week 1): Met PT Short Term Goal 2 (Week 1): pt will perform functional transfers consistently at mod A PT Short Term Goal 2 - Progress (Week 1): Met PT Short Term Goal 3 (Week 1): pt will demo intellectual awareness with mod A PT Short Term Goal 3 - Progress (Week 1): Partly met Week 2:  PT Short Term Goal 1 (Week 2): Pt will perform bed mobility with min A PT Short Term Goal 2 (Week 2): Pt will perform bed <> chair transfers with supervision squat pivot, min A stand pivot with LRAD PT Short Term Goal 3 (Week 2):  Pt will perform gait x 100' in controlled environment with LRAD and mod A PT Short Term Goal 4 (Week 2): Pt will negotiate 4 steps with min A   Skilled Therapeutic Interventions/Progress Updates:    AM session: Pt received in bed; pt reporting feeling very depressed today and perseverating on feelings of sadness, loneliness, hopelessness and not understanding why this happened to him.  Pt also perseverating on loss of control of his body, ability to move around and ability to see his family.  Provided pt with significant emotional support, attempted to re-orient pt to situation, current functional level, goals and D/C plan.  Also offered to take pt outside for therapy this afternoon.  Pt agreeable to therapy outside this afternoon but not agreeable to get OOB this morning.  Pt stating he just wants to lie in bed and think about things.  Educated pt on importance of participating in therapy sessions to improve strength, function, safety and mobility to progress towards goal and D/C home.  Offered pt multiple options for therapy session this am but pt refused each and continued to perseverate on emotions; continued to encourage pt but pt continued to state that he misses his family.  Offered to assist pt with calling his family; attempted to guide pt through visual scanning of phone to dial his wife's number but unable to successfully locate numbers.  Pt spent time on phone with wife discussing his emotions and desire to come home.  Pt grateful for opportunity to speak with his wife.  Pt more agreeable to transfer OOB.  Pt performed supine > sit with min A for  attention to L side of bed and LLE.  Transferred bed > recliner squat pivot to R side with min A and verbal cues for hand placement and sequence.  Pt continues to demonstrate severely impaired attention, awareness of deficits and memory limiting his ability to fully attend to and engage in therapy sessions.  Discussed with RN and will discuss with wife in  pm.  PM session: pt received in w/c still with gown on.  Pt awake and in brighter spirits and able to engage in conversation about football and family.  Continued to discuss with pt going outside for therapy but pt stated he wanted to wait until he had clothes.  Upon checking drawers pt noted to have clothing.  When asked to don clothing pt stated, "then I will be cold!".  Able to initiate dressing with pt while engaging in conversation about family.  Pt performed sit > stand to don pants with min-mod A.  Donned shirt in sitting.  Transported pt outside with w/c total A.  Outside performed gait training over inclined pavement (downhill/uphill) with RW and mod A with verbal cues for attention to midline and assistance to maintain straight navigation with RW.  Performed 8 stair negotiation with LUE support on rail to ascend and RUE support on rail to descend with mod A and verbal cues for step to sequence.  Pt required one seated rest break on bench.  Returned to room in w/c with pt extremely grateful for therapy session outside stating it lifted his spirits for the entire week.  Pt transferred to recliner stand pivot min A. Pt left in recliner to rest with quick release belt in place and all items within reach.    Therapy Documentation Precautions:  Precautions Precautions: Fall Precaution Comments: Swallowing precautions Restrictions Weight Bearing Restrictions: No     See Function Navigator for Current Functional Status.  Therapy/Group: Individual Therapy  Raylene Everts [facility] 05/03/2016, 9:45 AM

## 2016-05-03 NOTE — Progress Notes (Signed)
Speech Language Pathology Weekly Progress and Session Note  Patient Details  Name: Ernest [name] MRN: [id number] Date of Birth: 03/09/1956  Beginning of progress report period:  04/27/2016 End of progress report period: 05/03/2016  Today's Date: 05/03/2016 SLP Individual Time: 1300-1400 SLP Individual Time Calculation (min): 60 min   Short Term Goals: Week 1: SLP Short Term Goal 1 (Week 1): Pt to demonstrate general orientation x 4 with mod A. SLP Short Term Goal 1 - Progress (Week 1): Met SLP Short Term Goal 2 (Week 1): Pt to demonstrate gaze shifting to midline with max A in 3/4 opportunities.   SLP Short Term Goal 2 - Progress (Week 1): Progressing toward goal SLP Short Term Goal 3 (Week 1): Pt to tolerate trials of Dys 3 with adequate oral clearance with min A. SLP Short Term Goal 3 - Progress (Week 1): Progressing toward goal  SLP Short Term Goal 4 (Week 1): Pt to demonstrate basic problem solving during functional tasks with max A. SLP Short Term Goal 4 - Progress (Week 1): Met SLP Short Term Goal 5 (Week 1): Pt to attend to task for 5 minutes with mod A.  SLP Short Term Goal 5 - Progress (Week 1): Progressing toward goal    New Short Term Goals: Week 2: SLP Short Term Goal 1 (Week 2): Pt will demonstrate general orientation x 4 with min A. SLP Short Term Goal 2 (Week 2): Pt will gaze shift to midline with max A in 3/4 opportunities.   SLP Short Term Goal 3 (Week 2): Pt will tolerate trials of Dys 3 with adequate oral clearance with min A. SLP Short Term Goal 4 (Week 2): Pt will demonstrate basic problem solving during functional tasks with mod A. SLP Short Term Goal 5 (Week 2): Pt will attend to task for 5 minutes with mod A.   Weekly Progress Updates: Pt made slow functional gains this reporting period and has met 2 out of 5 targeted goals.  Pt currently requires max assist during basic familiar tasks due to significant cognitive deficits and visual limitations.  Pt has  demonstrated improvements in his task initiation, orientation, and his ability to sustain his attention to tasks.  Pt remains on a dys 2 diet with thin liquids due to cognition impacting safety when swallowing.  Pt would continue to benefit from skilled ST while inpatient in order to maximize functional independence and reduce burden of care prior to discharge.  Pt and family education is ongoing.     Intensity: Minumum of 1-2 x/day, 30 to 90 minutes Frequency: 3 to 5 out of 7 days Duration/Length of Stay: 21 days Treatment/Interventions: Cognitive remediation/compensation;Dysphagia/aspiration precaution training;Cueing hierarchy;Environmental controls;Patient/family education;Functional tasks   Daily Session  Skilled Therapeutic Interventions: Pt was seen for skilled ST targeting cognitive goals.  SLP utilized the [employer] to address sustained attention, visual scanning, and task initiation.  Pt required hand over hand assist to locate targets in both left and right fields of light board.  However, as task progressed therapist was able to fade cuing from max to min for task initiation.  Pt was able to sustain his attention to task for ~1 minute intervals with consistent min verbal cues for redirection.  Pt was returned to room and left with call bell within reach and quick release belt donned for safety.  Goals updated on this date to reflect current progress and plan of care.      Function:   Eating Eating                   Cognition Comprehension Comprehension assist level: Understands basic 75 - 89% of the time/ requires cueing 10 - 24% of the time  Expression   Expression assist level: Expresses basic 75 - 89% of the time/requires cueing 10 - 24% of the time. Needs helper to occlude trach/needs to repeat words.  Social Interaction Social Interaction assist level: Interacts appropriately 25 - 49% of time - Needs frequent redirection.  Problem Solving Problem solving assist level:  Solves basic 25 - 49% of the time - needs direction more than half the time to initiate, plan or complete simple activities  Memory Memory assist level: Recognizes or recalls 25 - 49% of the time/requires cueing 50 - 75% of the time   General    Pain Pain Assessment Pain Assessment: No/denies pain  Therapy/Group: Individual Therapy  ,  L 05/03/2016, 4:10 PM         

## 2016-05-03 NOTE — Progress Notes (Signed)
Occupational Therapy Weekly Progress Note  Patient Details  Name: Ernest [name] MRN: [id number] Date of Birth: 1956-05-21  Beginning of progress report period: April 27, 2016 End of progress report period: May 03, 2016  Today's Date: 05/03/2016 OT Individual Time: 6226-3335 OT Individual Time Calculation (min): 63 min     Patient has met 2 of 5 short term goals.  Pt is making slow progress towards goals, with progress hindered due to approx 2 days of decreased arousal and lethargy due to increase in pain meds.  Pt continues to demonstrate decreased visual attention and visual scanning as well as attention to task which are greatly impairing his ability to participate in ADLs.  Pt has progressed to a min-mod assist with transfers, requiring mod-max cues for safe use of RW due to decreased vision and attention.    Patient continues to demonstrate the following deficits: impaired cognition, attention, visual attention and scanning, LUE weakness, decreased trunk control, and balance and therefore will continue to benefit from skilled OT intervention to enhance overall performance with BADL and Reduce care partner burden.  Patient progressing toward long term goals..  Continue plan of care.  OT Short Term Goals Week 1:  OT Short Term Goal 1 (Week 1): Pt will complete toilet transfer with mod assist  OT Short Term Goal 1 - Progress (Week 1): Met OT Short Term Goal 2 (Week 1): Pt will visual scan environment to locate 3 items with min verbal prompts OT Short Term Goal 2 - Progress (Week 1): Progressing toward goal OT Short Term Goal 3 (Week 1): Pt will complete sit<>stand with min assist OT Short Term Goal 3 - Progress (Week 1): Met OT Short Term Goal 4 (Week 1): Pt will complete LB dressing with mod assist OT Short Term Goal 4 - Progress (Week 1): Progressing toward goal OT Short Term Goal 5 (Week 1): Pt will complete bathing with mod assist and moderate verbal cues for sequencing and  completion OT Short Term Goal 5 - Progress (Week 1): Progressing toward goal Week 2:  OT Short Term Goal 1 (Week 2): Pt will complete toilet transfers with min assist and LRAD OT Short Term Goal 2 (Week 2): Pt will visual scan environment to locate 3 items with min verbal prompts OT Short Term Goal 3 (Week 2): Pt will complete LB dressing with mod assist OT Short Term Goal 4 (Week 2): Pt will complete bathing with mod assist and moderate verbal cues for sequencing and completion   Skilled Therapeutic Interventions/Progress Updates:    Treatment session with focus on attention to task, visual attention, and improved mood.  Pt received on toilet with nurse tech.  Able to convince pt to engage in bathing at shower level.  Pt perseverative on feeling depressed today and feelings of sadness and loneliness without his wife and family present.  Able to redirect for moments throughout session while engaging in bathing at shower level.  Max cues to visually scan to midline to locate wash cloth and to wash Lt side of body.  Pt reports improved mood post shower. Engaged in oral care seated at sink with cues to scan to midline to locate grooming items.  Engaged in discussion revolving around leisure and hobbies while seated in [place] to decrease depressed mood and address Lt inattention with therapist placed to Lt and then to midline as pt unable to locate therapist when in [place].  Returned to room and bed at end of session.  Wife arrived  and discussed participation to this point as well as visual and attention limitations.  Therapy Documentation Precautions:  Precautions Precautions: Fall Precaution Comments: Swallowing precautions Restrictions Weight Bearing Restrictions: No Pain:  Pt with no c/o pain  See Function Navigator for Current Functional Status.   Therapy/Group: Individual Therapy  Simonne Come 05/03/2016, 12:29 PM

## 2016-05-03 NOTE — Progress Notes (Addendum)
[place] PHYSICAL MEDICINE & REHABILITATION     PROGRESS NOTE    Subjective/Complaints: Sitting in bed. About to eat breakfast. Admits to being depressed. Anxious to get home.   ROS limited by mental status  Objective: Vital Signs: Blood pressure (!) 161/74, pulse 71, temperature 97.6 F (36.4 C), temperature source Oral, resp. rate 18, height 5\' 9"  (1.753 m), weight 97.4 kg (214 lb 11.7 oz), SpO2 96 %. No results found. No results for input(s): WBC, HGB, HCT, PLT in the last 72 hours. No results for input(s): NA, K, CL, GLUCOSE, BUN, CREATININE, CALCIUM in the last 72 hours.  Invalid input(s): CO CBG (last 3)   Recent Labs  05/02/16 1645 05/02/16 2112 05/03/16 0652  GLUCAP 149* 99 133*    Wt Readings from Last 3 Encounters:  05/03/16 97.4 kg (214 lb 11.7 oz)  04/26/16 117 kg (258 lb)  08/18/13 89.1 kg (196 lb 6.4 oz)    Physical Exam:  Constitutional: He appears well-developed and well-nourished.  HENT:  Head: Normocephalic and atraumatic.  Eyes: Conjunctivae and EOM are normal.  Neck: Normal range of motion. Neck supple. No JVD present. No tracheal deviation present. No thyromegaly present.  Cardiovascular: Normal rate and regular rhythm.   Respiratory: Effort normal and breath sounds normal. No respiratory distress. He has no wheezes. He has no rales.  GI: Soft. Bowel sounds are normal. He exhibits no distension. There is no tenderness.  Musculoskeletal: He exhibits edema (1+ to 2+ pitting edema LLE--improving).  Neurological: He is alert.  Still with poor insight and awareness.   Left inattention---can be cued to look left. Left visual field deficits.   Moves LUE and LLE grossly 3 to 4/5 but very inconsistent. Senses pain when pinched on left side. RUE and RLE 5/5  Skin: Skin is warm and dry. No rash noted. No erythema.  Psychiatric:  Anxious at times   Assessment/Plan: 1. Cognitive, mobility, and visual-spatial deficits secondary to right  parietal-temporal ICH which require 3+ hours per day of interdisciplinary therapy in a comprehensive inpatient rehab setting. Physiatrist is providing close team supervision and 24 hour management of active medical problems listed below. Physiatrist and rehab team continue to assess barriers to discharge/monitor patient progress toward functional and medical goals.  Function:  Bathing Bathing position   Position: Bed  Bathing parts Body parts bathed by patient: Left arm, Chest, Abdomen, Front perineal area, Right upper leg, Left upper leg, Right arm Body parts bathed by helper: Buttocks, Front perineal area  Bathing assist Assist Level:  (Max A )      Upper Body Dressing/Undressing Upper body dressing   What is the patient wearing?: Hospital gown                Upper body assist        Lower Body Dressing/Undressing Lower body dressing   What is the patient wearing?: Non-skid slipper socks, Ted Hose           Non-skid slipper socks- Performed by helper: Don/doff right sock, Don/doff left sock               TED Hose - Performed by helper: Don/doff right TED hose, Don/doff left TED hose  Lower body assist Assist for lower body dressing:  (Max A)      Toileting Toileting Toileting activity did not occur: N/A   Toileting steps completed by helper: Adjust clothing prior to toileting, Performs perineal hygiene, Adjust clothing after toileting Toileting Assistive Devices: Grab  bar or rail  Toileting assist Assist level: Two helpers   Transfers Chair/bed transfer   Chair/bed transfer method: Squat pivot Chair/bed transfer assist level: Moderate assist (Pt 50 - 74%/lift or lower) Chair/bed transfer assistive device: Armrests     Locomotion Ambulation     Max distance: 10 Assist level: Moderate assist (Pt 50 - 74%)   Wheelchair   Type: Manual Max wheelchair distance: 50 Assist Level: Dependent (Pt equals 0%)  Cognition Comprehension Comprehension assist  level: Understands basic 50 - 74% of the time/ requires cueing 25 - 49% of the time  Expression Expression assist level: Expresses basic 50 - 74% of the time/requires cueing 25 - 49% of the time. Needs to repeat parts of sentences.  Social Interaction Social Interaction assist level: Interacts appropriately 25 - 49% of time - Needs frequent redirection.  Problem Solving Problem solving assist level: Solves basic 50 - 74% of the time/requires cueing 25 - 49% of the time  Memory Memory assist level: Recognizes or recalls 25 - 49% of the time/requires cueing 50 - 75% of the time   Medical Problem List and Plan: 1.  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              -continue CIR therapies  -substantial visual-perceptual deficits 2.  DVT Prophylaxis/Anticoagulation: Mechanical: Sequential compression devices, below knee Bilateral lower extremities, lovenox 40 qd 3. Pain Management: tylenol and percocet prn. Limit narcs as possible.  -headaches are persistent but now tolerable  -topamax stopped due to poor tolerance/sedation 4. Mood: with improved insight/awareness able to discuss his mood  -he's on cymbalta already  (appears to have been started on [place]). Will increase to .  -will ask neuropsych to see patient  -team to provide emotional support also 5. Neuropsych: This patient is not capable of making decisions on his own behalf.  -continue ritalin for arousal and to improve attention  -arousal/mental status MUCH improved off of topamax 6. Skin/Wound Care: local pressure relief, elevate lower ext                       -optimize nutrition 7. Fluids/Electrolytes/Nutrition:   -K+ 3.4 0n 8/1---labs still pending for today including prealbumin  -repleting K+, continue at bid              -follow I's and O's daily    8. Urine retention: removed foley.                       -encourage  OOB to void/check PVR's, I and O caths prn                       -Ucx with 30k klebsiella   -cipro complete 9. Fluid overload- lasix  BID--adjust regimen as indicated                       -daily weights (trending down) --97.4kg                       -follow I and O's 10. DM2- cbg's still elevated              -lantus-- increased to 10u bid (on 15u at home)  -sugars improved although po intake is inconsistent  -SSI    11. HTN:   bp's improving                       -  continue normodyne, hydralazine, lasix, catapres tabs   - catapres now at 0.2mg  tid                       -follow and adjust regimen as indicated with increased activity 12. Seizure proph: keppra 500mg  bid 13. ABLA--hgb trending up  -10.6 saturday   LOS (Days) 7 A FACE TO FACE EVALUATION WAS PERFORMED  SWARTZ,ZACHARY T 05/03/2016 9:20 AM

## 2016-05-04 ENCOUNTER — Inpatient Hospital Stay (HOSPITAL_COMMUNITY): Payer: Medicare Other | Admitting: Physical Therapy

## 2016-05-04 ENCOUNTER — Inpatient Hospital Stay (HOSPITAL_COMMUNITY): Payer: Medicare Other | Admitting: Speech Pathology

## 2016-05-04 DIAGNOSIS — E8779 Other fluid overload: Secondary | ICD-10-CM

## 2016-05-04 DIAGNOSIS — E119 Type 2 diabetes mellitus without complications: Secondary | ICD-10-CM

## 2016-05-04 LAB — GLUCOSE, CAPILLARY
GLUCOSE-CAPILLARY: 135 mg/dL — AB (ref 65–99)
GLUCOSE-CAPILLARY: 231 mg/dL — AB (ref 65–99)
GLUCOSE-CAPILLARY: 155 mg/dL — AB (ref 65–99)
Glucose-Capillary: 129 mg/dL — ABNORMAL HIGH (ref 65–99)
Glucose-Capillary: 96 mg/dL (ref 65–99)

## 2016-05-04 MED ORDER — HYDRALAZINE HCL 50 MG PO TABS
100.0000 mg | ORAL_TABLET | Freq: Three times a day (TID) | ORAL | Status: DC
  Administered 2016-05-04 – 2016-05-16 (×43): 100 mg via ORAL
  Filled 2016-05-04 (×44): qty 2

## 2016-05-04 NOTE — Progress Notes (Signed)
Physical Therapy Session Note  Patient Details  Name: Ernest [name] MRN: [id number] Date of Birth: 03-09-1956  Today's Date: 05/04/2016 PT Individual Time: 5374-8270 and 7867-5449 PT Individual Time Calculation (min): 38 min and 8 min   Short Term Goals: Week 2:  PT Short Term Goal 1 (Week 2): Pt will perform bed mobility with min A PT Short Term Goal 2 (Week 2): Pt will perform bed <> chair transfers with supervision squat pivot, min A stand pivot with LRAD PT Short Term Goal 3 (Week 2): Pt will perform gait x 100' in controlled environment with LRAD and mod A PT Short Term Goal 4 (Week 2): Pt will negotiate 4 steps with min A  Skilled Therapeutic Interventions/Progress Updates:  AM session: Pt received in bed asleep during therapist's first attempt at therapy session.  Pt unable to be aroused even when sternal rub or nail bed pressure.  Pt finally able to verbalize "sleep."  Allowed pt time to sleep and returned 2 hours later to re-attempt session.  Pt continued to be asleep and difficult to awaken.  Pt was able to open his eyes for short period of time and respond to cues/commands with extra time.  Assisted pt with donning pants in supine and performed supine > sit EOB with mod A.  Performed sit > stand from bed with mod-max A to pull up pants.  RN present to assess vitals and give morning medication.  Pt BP noted to be elevated; see RN note.  Assisted with maintaining pt sitting balance EOB mod-max A and alertness in order for pt to swallow meds safely.  Pt's CBG also assessed-WFL.  Pt performed sit > stand again with mod-max A and extra time to initiate; performed stand pivot to recliner with pt keeping his eyes closed the entire time with RN assisting with guiding RW and therapist providing verbal cues.  Pt left in recliner with quick release belt in place and LE elevated.  NT to assist pt with breakfast.  RN to alert MD of pt extreme lethargy.  PM session:Pt received in recliner still asleep.   Pt opened eyes briefly to name calling but unable to maintain.  Vitals assessed due to continued lethargy, all WFL.  Attempted to arouse pt with sit > stand to use urinal.  Pt unable to initiate leaning forward or sit > stand due to lethargy.  Pt returned to reclined position with LE elevated.  Pt unable to participate in therapy due to significant lethargy and inability to be awakened.    Therapy Documentation Precautions:  Precautions Precautions: Fall Precaution Comments: Swallowing precautions Restrictions Weight Bearing Restrictions: No General: PT Amount of Missed Time (min): 22 Minutes PT Missed Treatment Reason: Patient fatigue Vital Signs: Therapy Vitals BP: (!) 189/101 Pain: Pain Assessment Pain Assessment: No/denies pain   See Function Navigator for Current Functional Status.   Therapy/Group: Individual Therapy  Edman Circle [facility] 05/04/2016, 11:27 AM

## 2016-05-04 NOTE — Progress Notes (Signed)
[facility] PHYSICAL MEDICINE & REHABILITATION     PROGRESS NOTE    Subjective/Complaints: Pt laying in bed.  He is very somnolent this AM, only briefly opening eyes.  ROS limited by mental status  Objective: Vital Signs: Blood pressure (!) 178/92, pulse 77, temperature 98.2 F (36.8 C), temperature source Axillary, resp. rate 17, height  (1.753 m), weight 88.1 kg (194 lb 3.6 oz), SpO2 100 %. No results found. No results for input(s): WBC, HGB, HCT, PLT in the last 72 hours. No results for input(s): NA, K, CL, GLUCOSE, BUN, CREATININE, CALCIUM in the last 72 hours.  Invalid input(s): CO CBG (last 3)   Recent Labs  05/04/16 1040 05/04/16 1224 05/04/16 1644  GLUCAP 135* 231* 155*    Wt Readings from Last 3 Encounters:  05/04/16 88.1 kg (194 lb 3.6 oz)  04/26/16 117 kg (258 lb)  08/18/13 89.1 kg (196 lb 6.4 oz)    Physical Exam:  Constitutional: He appears well-developed and well-nourished.  HENT: Normocephalic and atraumatic.  Eyes: Conjunctivae and EOM are normal.   Cardiovascular: Normal rate and regular rhythm.   Respiratory: Effort normal and breath sounds normal. No respiratory distress. He has no wheezes. He has no rales.  GI: Soft. Bowel sounds are normal. He exhibits no distension. There is no tenderness.  Musculoskeletal: He exhibits edema (1+ to 2+ pitting edema LLE--improving).  Neurological: He is somnolent.  Still with poor insight and awareness.    Left inattention Somnolent this AM, previously moving LUE and LLE grossly 3 to 4/5 but very inconsistent.  RUE and RLE 5/5  Skin: Skin is warm and dry. No rash noted. No erythema.  Psychiatric:  Flat, lethargic this AM.   Assessment/Plan: 1. Cognitive, mobility, and visual-spatial deficits secondary to right parietal-temporal ICH which require 3+ hours per day of interdisciplinary therapy in a comprehensive inpatient rehab setting. Physiatrist is providing close team supervision and 24 hour  management of active medical problems listed below. Physiatrist and rehab team continue to assess barriers to discharge/monitor patient progress toward functional and medical goals.  Function:  Bathing Bathing position   Position: Shower  Bathing parts Body parts bathed by patient: Left arm, Chest, Abdomen, Right upper leg Body parts bathed by helper: Right arm, Front perineal area, Buttocks, Left upper leg, Right lower leg, Left lower leg, Back  Bathing assist Assist Level:  (Max assist)      Upper Body Dressing/Undressing Upper body dressing   What is the patient wearing?: Hospital gown                Upper body assist        Lower Body Dressing/Undressing Lower body dressing   What is the patient wearing?: Non-skid slipper socks         Non-skid slipper socks- Performed by patient: Don/doff right sock, Don/doff left sock Non-skid slipper socks- Performed by helper: Don/doff right sock, Don/doff left sock               TED Hose - Performed by helper: Don/doff right TED hose, Don/doff left TED hose  Lower body assist Assist for lower body dressing: Touching or steadying assistance (Pt > 75%), Supervision or verbal cues      Toileting Toileting Toileting activity did not occur: N/A   Toileting steps completed by helper: Adjust clothing prior to toileting, Performs perineal hygiene, Adjust clothing after toileting Toileting Assistive Devices: Grab bar or rail  Toileting assist Assist level: Two helpers  Transfers Chair/bed transfer   Chair/bed transfer method: Stand pivot Chair/bed transfer assist level: Maximal assist (Pt 25 - 49%/lift and lower) Chair/bed transfer assistive device: Patent attorney     Max distance: 50 Assist level: Moderate assist (Pt 50 - 74%)   Wheelchair   Type: Manual Max wheelchair distance: 50 Assist Level: Dependent (Pt equals 0%)  Cognition Comprehension Comprehension assist level: Understands basic 50  - 74% of the time/ requires cueing 25 - 49% of the time  Expression Expression assist level: Expresses basic 75 - 89% of the time/requires cueing 10 - 24% of the time. Needs helper to occlude trach/needs to repeat words.  Social Interaction Social Interaction assist level: Interacts appropriately 75 - 89% of the time - Needs redirection for appropriate language or to initiate interaction.  Problem Solving Problem solving assist level: Solves basic 25 - 49% of the time - needs direction more than half the time to initiate, plan or complete simple activities  Memory Memory assist level: Recognizes or recalls 25 - 49% of the time/requires cueing 50 - 75% of the time   Medical Problem List and Plan: 1.  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              -continue CIR therapies  -substantial visual-perceptual deficits 2.  DVT Prophylaxis/Anticoagulation: Mechanical: Sequential compression devices, below knee Bilateral lower extremities, lovenox 40 qd 3. Pain Management: tylenol and percocet prn. Limit narcs as possible.  -headaches are persistent but now tolerable  -topamax stopped due to poor tolerance/sedation 4. Mood: with improved insight/awareness able to discuss his mood  -Cymbalta 30mg  (appears to have been started on [facility]) increased to 40mg .  -neuropsych  -team to provide emotional support also 5. Neuropsych: This patient is not capable of making decisions on his own behalf.  -continue ritalin for arousal and to improve attention  -arousal/mental status has appeared to improve off of topamax 6. Skin/Wound Care: local pressure relief, elevate lower ext                       -optimize nutrition 7. Fluids/Electrolytes/Nutrition:   Dysphagia 2 thin  -K+ 3.4 0n 8/1  -repleting K+, continue at bid              -follow I's and O's daily 8. Urine retention: removed foley.                        -encourage OOB to void/check PVR's, I and O caths prn                       -Ucx with 30k klebsiella, cipro completed 9. Fluid overload- lasix 60mg  BID--adjust regimen as indicated                       -daily weights trending down                       -follow I and O's Filed Weights   05/02/16 0530 05/03/16 0521 05/04/16 0557  Weight: 97.5 kg (215 lb) 97.4 kg (214 lb 11.7 oz) 88.1 kg (194 lb 3.6 oz)    10. DM2              -lantus-- increased to 10u bid (on 15u at home)  -sugars improved although po intake  is inconsistent  -SSI  Relatively controlled    11. HTN:  -continue hydralazine, lasix, catapres tabs   - catapres now at 0.2mg  tid   hydralazine increased to 100 on 8/5                       -follow and adjust regimen as indicated with increased activity 12. Seizure proph: keppra 500mg  bid 13. ABLA--hgb trending up  -10.7 on 8/1   LOS (Days) 8 A FACE TO FACE EVALUATION WAS PERFORMED  Cambelle Suchecki Karis Juba 05/04/2016 6:06 PM

## 2016-05-05 ENCOUNTER — Inpatient Hospital Stay (HOSPITAL_COMMUNITY): Payer: Medicare Other | Admitting: Occupational Therapy

## 2016-05-05 LAB — GLUCOSE, CAPILLARY
GLUCOSE-CAPILLARY: 116 mg/dL — AB (ref 65–99)
Glucose-Capillary: 132 mg/dL — ABNORMAL HIGH (ref 65–99)
Glucose-Capillary: 196 mg/dL — ABNORMAL HIGH (ref 65–99)
Glucose-Capillary: 168 mg/dL — ABNORMAL HIGH (ref 65–99)

## 2016-05-05 MED ORDER — CLONIDINE HCL 0.2 MG PO TABS
0.3000 mg | ORAL_TABLET | Freq: Three times a day (TID) | ORAL | Status: DC
  Administered 2016-05-05 – 2016-05-16 (×32): 0.3 mg via ORAL
  Filled 2016-05-05 (×33): qty 1

## 2016-05-05 NOTE — Progress Notes (Signed)
[facility] PHYSICAL MEDICINE & REHABILITATION     PROGRESS NOTE    Subjective/Complaints: Pt laying in bed.  He responds nonverbally, but does not make eye contact.   ROS limited by mental status, but appears to deny CP, SOB, N/V/D.  Objective: Vital Signs: Blood pressure (!) 197/100, pulse 76, temperature 98.3 F (36.8 C), temperature source Oral, resp. rate 18, height  (1.753 m), weight 91.1 kg (200 lb 13.4 oz), SpO2 100 %. No results found. No results for input(s): WBC, HGB, HCT, PLT in the last 72 hours. No results for input(s): NA, K, CL, GLUCOSE, BUN, CREATININE, CALCIUM in the last 72 hours.  Invalid input(s): CO CBG (last 3)   Recent Labs  05/04/16 1644 05/04/16 2034 05/05/16 0632  GLUCAP 155* 96 132*    Wt Readings from Last 3 Encounters:  05/05/16 91.1 kg (200 lb 13.4 oz)  04/26/16 117 kg (258 lb)  08/18/13 89.1 kg (196 lb 6.4 oz)    Physical Exam:  Constitutional: He appears well-developed and well-nourished.  HENT: Normocephalic and atraumatic.  Eyes: Conjunctivae and EOM are normal.   Cardiovascular: Normal rate and regular rhythm.   Respiratory: Effort normal and breath sounds normal. No respiratory distress. He has no wheezes. He has no rales.  GI: Soft. Bowel sounds are normal. He exhibits no distension. There is no tenderness.  Musculoskeletal: He exhibits edema (1+ to 2+ pitting edema LLE--improving).  Neurological: He is alert.  Follows commands. Expressive aphasia Still with poor insight and awareness.    Left inattention Motor: LUE 4+/5 proximal to distal LLE: 4/5 hip flexion, 5/5 knee extension, 5/5 ankle dorsi/plantflexion.   RUE/RLE: 5/5  Skin: Skin is warm and dry. No rash noted. No erythema.  Psychiatric:  Flat, does not make eye contact.   Assessment/Plan: 1. Cognitive, mobility, and visual-spatial deficits secondary to right parietal-temporal ICH which require 3+ hours per day of interdisciplinary therapy in a comprehensive  inpatient rehab setting. Physiatrist is providing close team supervision and 24 hour management of active medical problems listed below. Physiatrist and rehab team continue to assess barriers to discharge/monitor patient progress toward functional and medical goals.  Function:  Bathing Bathing position   Position: Shower  Bathing parts Body parts bathed by patient: Left arm, Chest, Abdomen, Right upper leg Body parts bathed by helper: Right arm, Front perineal area, Buttocks, Left upper leg, Right lower leg, Left lower leg, Back  Bathing assist Assist Level:  (Max assist)      Upper Body Dressing/Undressing Upper body dressing   What is the patient wearing?: Hospital gown                Upper body assist        Lower Body Dressing/Undressing Lower body dressing   What is the patient wearing?: Non-skid slipper socks         Non-skid slipper socks- Performed by patient: Don/doff right sock, Don/doff left sock Non-skid slipper socks- Performed by helper: Don/doff right sock, Don/doff left sock               TED Hose - Performed by helper: Don/doff right TED hose, Don/doff left TED hose  Lower body assist Assist for lower body dressing: Touching or steadying assistance (Pt > 75%), Supervision or verbal cues      Toileting Toileting Toileting activity did not occur: N/A   Toileting steps completed by helper: Adjust clothing prior to toileting, Performs perineal hygiene, Adjust clothing after toileting Toileting Assistive Devices:  Grab bar or rail  Toileting assist Assist level: Two helpers   Transfers Chair/bed transfer   Chair/bed transfer method: Stand pivot Chair/bed transfer assist level: Maximal assist (Pt 25 - 49%/lift and lower) Chair/bed transfer assistive device: Patent attorneyWalker     Locomotion Ambulation     Max distance: 50 Assist level: Moderate assist (Pt 50 - 74%)   Wheelchair   Type: Manual Max wheelchair distance: 50 Assist Level: Dependent (Pt  equals 0%)  Cognition Comprehension Comprehension assist level: Understands basic 50 - 74% of the time/ requires cueing 25 - 49% of the time  Expression Expression assist level: Expresses basis less than 25% of the time/requires cueing >75% of the time.  Social Interaction Social Interaction assist level: Interacts appropriately less than 25% of the time. May be withdrawn or combative.  Problem Solving Problem solving assist level: Solves basic less than 25% of the time - needs direction nearly all the time or does not effectively solve problems and may need a restraint for safety  Memory Memory assist level: Recognizes or recalls less than 25% of the time/requires cueing greater than 75% of the time   Medical Problem List and Plan: 1.  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              -continue CIR therapies  -substantial visual-perceptual deficits 2.  DVT Prophylaxis/Anticoagulation: Mechanical: Sequential compression devices, below knee Bilateral lower extremities, lovenox 40 qd 3. Pain Management: tylenol and percocet prn. Limit narcs as possible.  -headaches are persistent but now tolerable  -topamax stopped due to poor tolerance/sedation 4. Mood: with improved insight/awareness able to discuss his mood  -Cymbalta 30mg  (appears to have been started on [facility]) increased to 40mg .  -neuropsych  -team to provide emotional support also 5. Neuropsych: This patient is not capable of making decisions on his own behalf.  -continue ritalin for arousal and to improve attention  -arousal/mental status has appeared to improve off of topamax 6. Skin/Wound Care: local pressure relief, elevate lower ext                       -optimize nutrition 7. Fluids/Electrolytes/Nutrition:   Dysphagia 2 thin  -K+ 3.4 0n 8/1  -repleting K+, continue at 40meq bid              -follow I's and O's daily 8. Urine  retention: removed foley.                       -encourage OOB to void/check PVR's, I and O caths prn                       -Ucx with 30k klebsiella, cipro completed 9. Fluid overload- lasix 60mg  BID--adjust regimen as indicated                       -daily weights slightly unreliable, but overall trending down                       -follow I and O's Filed Weights   05/03/16 0521 05/04/16 0557 05/05/16 0559  Weight: 97.4 kg (214 lb 11.7 oz) 88.1 kg (194 lb 3.6 oz) 91.1 kg (200 lb 13.4 oz)  10. DM2              -lantus-- increased to 10u bid (on 15u at  home)  -sugars improved although po intake is inconsistent  -SSI  Relatively controlled 11. HTN:  -continue hydralazine, lasix, catapres tabs   -catapres 0.2mg  tid, increased to 0.3mg  on 8/6   hydralazine increased to 100 on 8/5                       -follow and adjust regimen as indicated with increased activity 12. Seizure proph: keppra 500mg  bid 13. ABLA--hgb trending up  -10.7 on 8/1   LOS (Days) 9 A FACE TO FACE EVALUATION WAS PERFORMED  Ankit Karis Juba 05/05/2016 7:33 AM

## 2016-05-05 NOTE — Progress Notes (Signed)
Occupational Therapy Session Note  Patient Details  Name: [name] Lizotte MRN: [id number] Date of Birth: 03-Jan-1956  Today's Date: 05/05/2016 OT Individual Time: [id number] OT Individual Time Calculation (min): 55 min     Short Term Goals: Week 2:  OT Short Term Goal 1 (Week 2): Pt will complete toilet transfers with min assist and LRAD OT Short Term Goal 2 (Week 2): Pt will visual scan environment to locate 3 items with min verbal prompts OT Short Term Goal 3 (Week 2): Pt will complete LB dressing with mod assist OT Short Term Goal 4 (Week 2): Pt will complete bathing with mod assist and moderate verbal cues for sequencing and completion  Skilled Therapeutic Interventions/Progress Updates:    Treatment session with focus on ADL retraining with bed mobility, sitting balance, and visual attention.  Pt in bed upon arrival with soiled incontinence brief.  Pt declined bathing at shower level but willing to clean up at bedside.  Completed rolling Rt and Lt for hygiene with pt requiring hand over hand assist to locate bed rails to assist in rolling with min assist when rolling to Lt and mod assist rolling to Rt with physical assist to position LLE.  Engaged in sitting at EOB with min assist for sitting balance as pt tended to lean to Rt, while RN assisted pt with meds.  Transferred to recliner with min-mod assist stand pivot to Lt with max cues for sequencing due to decreased attention and visual attention.  UB bathing and dressing completed at sink with focus on increased initiation and sequencing.  Max multimodal cues for sequencing with UB dressing due to impaired attention and visual impairments.  Left upright in recliner with nurse tech to assist with feeding.    Therapy Documentation Precautions:  Precautions Precautions: Fall Precaution Comments: Swallowing precautions Restrictions Weight Bearing Restrictions: No General:   Vital Signs: Therapy Vitals Temp: 98.3 F (36.8 C) Temp Source:  Oral Pulse Rate: 76 Resp: 18 BP: (!) 186/90 Patient Position (if appropriate): Lying Oxygen Therapy SpO2: 100 % O2 Device: Not Delivered Pain:    See Function Navigator for Current Functional Status.   Therapy/Group: Individual Therapy  Rosalio LoudHOXIE, Hommer Cunliffe 05/05/2016, 9:25 AM

## 2016-05-05 NOTE — Progress Notes (Signed)
Patient BP this morning was 197/100. MD notified and said he he will address it. Pass it in report. We continue to monitor.

## 2016-05-05 NOTE — Plan of Care (Signed)
Problem: RH SAFETY Goal: RH STG ADHERE TO SAFETY PRECAUTIONS W/ASSISTANCE/DEVICE STG Adhere to Safety Precautions With min Assistance/Device.  Outcome: Not Progressing Needs reinforcement to use call bell for assistance

## 2016-05-06 ENCOUNTER — Inpatient Hospital Stay (HOSPITAL_COMMUNITY): Payer: Medicare Other | Admitting: Occupational Therapy

## 2016-05-06 ENCOUNTER — Inpatient Hospital Stay (HOSPITAL_COMMUNITY): Payer: Medicare Other | Admitting: Speech Pathology

## 2016-05-06 ENCOUNTER — Inpatient Hospital Stay (HOSPITAL_COMMUNITY): Payer: Medicare Other | Admitting: Physical Therapy

## 2016-05-06 LAB — CBC WITH DIFFERENTIAL/PLATELET
BASOS ABS: 0 10*3/uL (ref 0.0–0.1)
BASOS PCT: 0 %
EOS ABS: 0.2 10*3/uL (ref 0.0–0.7)
EOS PCT: 3 %
HCT: 36.5 % — ABNORMAL LOW (ref 39.0–52.0)
Hemoglobin: 12.1 g/dL — ABNORMAL LOW (ref 13.0–17.0)
Lymphocytes Relative: 36 %
Lymphs Abs: 1.6 10*3/uL (ref 0.7–4.0)
MCH: 29.2 pg (ref 26.0–34.0)
MCHC: 33.2 g/dL (ref 30.0–36.0)
MCV: 88.2 fL (ref 78.0–100.0)
Monocytes Absolute: 0.5 10*3/uL (ref 0.1–1.0)
Monocytes Relative: 12 %
NEUTROS PCT: 49 %
Neutro Abs: 2.2 10*3/uL (ref 1.7–7.7)
PLATELETS: 181 10*3/uL (ref 150–400)
RBC: 4.14 MIL/uL — AB (ref 4.22–5.81)
RDW: 12.3 % (ref 11.5–15.5)
WBC: 4.5 10*3/uL (ref 4.0–10.5)

## 2016-05-06 LAB — BASIC METABOLIC PANEL
ANION GAP: 9 (ref 5–15)
BUN: 20 mg/dL (ref 6–20)
CALCIUM: 8.7 mg/dL — AB (ref 8.9–10.3)
CO2: 24 mmol/L (ref 22–32)
CREATININE: 1.04 mg/dL (ref 0.61–1.24)
Chloride: 101 mmol/L (ref 101–111)
Glucose, Bld: 156 mg/dL — ABNORMAL HIGH (ref 65–99)
Potassium: 3.2 mmol/L — ABNORMAL LOW (ref 3.5–5.1)
Sodium: 134 mmol/L — ABNORMAL LOW (ref 135–145)

## 2016-05-06 LAB — GLUCOSE, CAPILLARY
GLUCOSE-CAPILLARY: 163 mg/dL — AB (ref 65–99)
GLUCOSE-CAPILLARY: 128 mg/dL — AB (ref 65–99)
GLUCOSE-CAPILLARY: 189 mg/dL — AB (ref 65–99)
Glucose-Capillary: 108 mg/dL — ABNORMAL HIGH (ref 65–99)

## 2016-05-06 MED ORDER — FLUOXETINE HCL 10 MG PO CAPS
10.0000 mg | ORAL_CAPSULE | Freq: Every day | ORAL | Status: DC
  Administered 2016-05-06 – 2016-05-07 (×2): 10 mg via ORAL
  Filled 2016-05-06 (×2): qty 1

## 2016-05-06 NOTE — Progress Notes (Signed)
Occupational Therapy Session Note  Patient Details  Name: Ernest [name] MRN: [id number] Date of Birth: 11-12-1955  Today's Date: 05/06/2016 OT Individual Time: 1405-1500 OT Individual Time Calculation (min): 55 min     Short Term Goals: Week 2:  OT Short Term Goal 1 (Week 2): Pt will complete toilet transfers with min assist and LRAD OT Short Term Goal 2 (Week 2): Pt will visual scan environment to locate 3 items with min verbal prompts OT Short Term Goal 3 (Week 2): Pt will complete LB dressing with mod assist OT Short Term Goal 4 (Week 2): Pt will complete bathing with mod assist and moderate verbal cues for sequencing and completion  Skilled Therapeutic Interventions/Progress Updates:    Treatment session with focus on active participation in treatment session and orientation to time and situation.  Pt received upright in w/c with PA for emotional support.  Pt reports feeling depressed and having a "pity party" often repeating himself throughout session requiring cues to redirect to attempt to engage in motivational activity.  Attempted to engage in conversation to include pt's wife, children, and grandson to which pt would briefly perk up but then would quickly return to "depressed mood".  Pt stating that he had been requesting to jump out the window, but now understood that he still had a purpose here but was still depressed.  Able to engage briefly in tossing football for 2 throws requiring max cues to scan to Lt to locate therapist, unable to catch ball as he was unable to visually locate or attend long enough.  Ambulated 35 feet with 3 musketeers style to promote trust in therapy team and increase participation, as well as pt tends to be less safe with RW due to impaired vision/attention.  Ambulated 15 feet back to recliner in room, able to locate recliner in Rt visual field, but required increased cues to turn to sit in recliner.  Assisted pt in calling his wife, he reports feeling somewhat  more hopeful and encouraged after speaking with her.   Therapy Documentation Precautions:  Precautions Precautions: Fall Precaution Comments: Swallowing precautions Restrictions Weight Bearing Restrictions: No General:   Vital Signs: Therapy Vitals Temp: 98.1 F (36.7 C) Temp Source: Oral Pulse Rate: 71 Resp: 18 BP: (!) 148/83 Patient Position (if appropriate): Sitting Oxygen Therapy SpO2: 98 % O2 Device: Not Delivered Pain: Pain Assessment Pain Assessment: No/denies pain  See Function Navigator for Current Functional Status.   Therapy/Group: Individual Therapy  Ernest [name], Ernest [name] 05/06/2016, 3:34 PM

## 2016-05-06 NOTE — Progress Notes (Signed)
[facility] PHYSICAL MEDICINE & REHABILITATION     PROGRESS NOTE    Subjective/Complaints: "I feel dejected":"JUst want to see my family" No suicidal thoughts  ROS limited by mental status, but appears to deny CP, SOB, N/V/D.  Objective: Vital Signs: Blood pressure (!) 165/84, pulse 73, temperature 98.6 F (37 C), temperature source Oral, resp. rate 18, height 5\' 9"  (1.753 m), weight 91.1 kg (200 lb 13.4 oz), SpO2 95 %. No results found. No results for input(s): WBC, HGB, HCT, PLT in the last 72 hours. No results for input(s): NA, K, CL, GLUCOSE, BUN, CREATININE, CALCIUM in the last 72 hours.  Invalid input(s): CO CBG (last 3)   Recent Labs  05/05/16 1146 05/05/16 1626 05/05/16 2056  GLUCAP 196* 116* 168*    Wt Readings from Last 3 Encounters:  05/06/16 91.1 kg (200 lb 13.4 oz)  04/26/16 117 kg (258 lb)  08/18/13 89.1 kg (196 lb 6.4 oz)    Physical Exam:  Constitutional: He appears well-developed and well-nourished.  HENT: Normocephalic and atraumatic.  Eyes: Conjunctivae and EOM are normal.   Cardiovascular: Normal rate and regular rhythm.   Respiratory: Effort normal and breath sounds normal. No respiratory distress. He has no wheezes. He has no rales.  GI: Soft. Bowel sounds are normal. He exhibits no distension. There is no tenderness.  Musculoskeletal: He exhibits edema (1+ to 2+ pitting edema LLE--improving).  Neurological: He is alert.  Follows commands. Expressive aphasia Still with poor insight and awareness.    Left inattention Motor: LUE 4+/5 proximal to distal LLE: 4/5 hip flexion, 5/5 knee extension, 5/5 ankle dorsi/plantflexion.   RUE/RLE: 5/5  Skin: Skin is warm and dry. No rash noted. No erythema.  Psychiatric:  Flat, does not make eye contact.   Assessment/Plan: 1. Cognitive, mobility, and visual-spatial deficits secondary to right parietal-temporal ICH which require 3+ hours per day of interdisciplinary therapy in a comprehensive inpatient  rehab setting. Physiatrist is providing close team supervision and 24 hour management of active medical problems listed below. Physiatrist and rehab team continue to assess barriers to discharge/monitor patient progress toward functional and medical goals.  Function:  Bathing Bathing position   Position: Bed (bed for perineal area, seated at sink for UB)  Bathing parts Body parts bathed by patient: Right arm, Left arm, Chest, Abdomen, Front perineal area Body parts bathed by helper: Buttocks, Back  Bathing assist Assist Level:  (Mod assist)      Upper Body Dressing/Undressing Upper body dressing   What is the patient wearing?: Pull over shirt/dress     Pull over shirt/dress - Perfomed by patient: Put head through opening, Pull shirt over trunk Pull over shirt/dress - Perfomed by helper: Thread/unthread left sleeve, Thread/unthread right sleeve        Upper body assist Assist Level:  (Mod assist)      Lower Body Dressing/Undressing Lower body dressing   What is the patient wearing?: Non-skid slipper socks         Non-skid slipper socks- Performed by patient: Don/doff right sock, Don/doff left sock Non-skid slipper socks- Performed by helper: Don/doff right sock, Don/doff left sock               TED Hose - Performed by helper: Don/doff right TED hose, Don/doff left TED hose  Lower body assist Assist for lower body dressing: Touching or steadying assistance (Pt > 75%), Supervision or verbal cues      Toileting Toileting Toileting activity did not occur: N/A  Toileting steps completed by helper: Adjust clothing prior to toileting, Performs perineal hygiene, Adjust clothing after toileting Toileting Assistive Devices: Grab bar or rail  Toileting assist Assist level: Two helpers   Transfers Chair/bed transfer   Chair/bed transfer method: Stand pivot Chair/bed transfer assist level: Moderate assist (Pt 50 - 74%/lift or lower) Chair/bed transfer assistive device:  Patent attorney     Max distance: 50 Assist level: Moderate assist (Pt 50 - 74%)   Wheelchair   Type: Manual Max wheelchair distance: 50 Assist Level: Dependent (Pt equals 0%)  Cognition Comprehension Comprehension assist level: Understands basic 50 - 74% of the time/ requires cueing 25 - 49% of the time  Expression Expression assist level: Expresses basic 50 - 74% of the time/requires cueing 25 - 49% of the time. Needs to repeat parts of sentences.  Social Interaction Social Interaction assist level: Interacts appropriately 50 - 74% of the time - May be physically or verbally inappropriate.  Problem Solving Problem solving assist level: Solves basic 50 - 74% of the time/requires cueing 25 - 49% of the time  Memory Memory assist level: Recognizes or recalls 50 - 74% of the time/requires cueing 25 - 49% of the time   Medical Problem List and Plan: 1.  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              -continue CIR therapies  -substantial visual-perceptual deficits 2.  DVT Prophylaxis/Anticoagulation: Mechanical: Sequential compression devices, below knee Bilateral lower extremities, lovenox 40 qd 3. Pain Management: tylenol and percocet prn. Limit narcs as possible.  -headaches are persistent but now tolerable  -topamax stopped due to poor tolerance/sedation 4. Mood: with improved insight/awareness able to discuss his mood  -Cymbalta  (appears to have been started on [facility]) increased to .  -neuropsych  -team to provide emotional support also 5. Neuropsych: This patient is not capable of making decisions on his own behalf.  -continue ritalin for arousal and to improve attention  -arousal/mental status has appeared to improve off of topamax 6. Skin/Wound Care: local pressure relief, elevate lower ext                       -optimize nutrition 7.  Fluids/Electrolytes/Nutrition:   Dysphagia 2 thin  -K+ 3.4 0n 8/1  -repleting K+, continue at bid              -follow I's and O's daily 8. Urine retention: removed foley.                       -improved but incont 9. Fluid overload- lasix  BID--adjust regimen as indicated                       -daily weights slightly unreliable, but overall trending down                       -follow I and O's Filed Weights   05/04/16 0557 05/05/16 0559 05/06/16 0529  Weight: 88.1 kg (194 lb 3.6 oz) 91.1 kg (200 lb 13.4 oz) 91.1 kg (200 lb 13.4 oz)  10. DM2              -lantus-- increased to 10u bid (on 15u at home)  -sugars improved although po intake is inconsistent  -SSI   CBG (last 3)  Recent Labs  05/05/16 1626 05/05/16 2056 05/06/16 0621  GLUCAP 116* 168* 108*    11. HTN:  -continue hydralazine, lasix, catapres tabs Vitals:   05/05/16 2122 05/06/16 0529  BP: (!) 157/80 (!) 165/84  Pulse:  73  Resp:  18  Temp:  98.6 F (37 C)     -catapres 0.2mg  tid, increased to 0.3mg  on 8/6   hydralazine increased to 100 on 8/5                       -follow and adjust regimen as indicated  12. Seizure proph: keppra 500mg  bid 13. ABLA--hgb trending up  -10.7 on 8/1   LOS (Days) 10 A FACE TO FACE EVALUATION WAS PERFORMED  Forrest Jaroszewski E 05/06/2016 6:42 AM

## 2016-05-06 NOTE — Progress Notes (Signed)
Speech Language Pathology Daily Session Note  Patient Details  Name: Ernest [name] MRN: [id number] Date of Birth: 02-Jun-1956  Today's Date: 05/06/2016 SLP Individual Time: 1002-1050 SLP Individual Time Calculation (min): 48 min   Short Term Goals: Week 2: SLP Short Term Goal 1 (Week 2): Pt will demonstrate general orientation x 4 with min A. SLP Short Term Goal 2 (Week 2): Pt will gaze shift to midline with max A in 3/4 opportunities.   SLP Short Term Goal 3 (Week 2): Pt will tolerate trials of Dys 3 with adequate oral clearance with min A. SLP Short Term Goal 4 (Week 2): Pt will demonstrate basic problem solving during functional tasks with mod A. SLP Short Term Goal 5 (Week 2): Pt will attend to task for 5 minutes with mod A.   Skilled Therapeutic Interventions:  Pt was seen for skilled ST targeting cognitive goals.  Pt with increased verbalization of depression today in comparison to previous therapy sessions and required almost constant redirection to tasks due to perseveration.   Perseveration was only fleetingly halted when engaged in various simple, functional activities.   Pt able to brush his teeth with set up assist and mod verbal cues for sequencing.  Pt able to open containers and mix cream and sugar into coffee with max assist verbal and tactile cues to locate items on his left.  Pt also able to attend to a basic block sorting task for ~30 seconds before requiring max assist multimodal cues for redirection.  As session progressed, pt became increasingly perseverative regarding his depression.  Therapist attempted to verify pt's feelings, firmly redirect pt to structured tasks, and distract pt during meaningful tasks with limited effect.  Due to inability to further redirect pt and pt becoming increasingly upset, session was ended early.  RN and CSW made aware.  Continue per current plan of care.     Function:  Eating Eating                 Cognition Comprehension  Comprehension assist level: Understands basic 50 - 74% of the time/ requires cueing 25 - 49% of the time  Expression   Expression assist level: Expresses basic 90% of the time/requires cueing < 10% of the time.  Social Interaction Social Interaction assist level: Interacts appropriately 25 - 49% of time - Needs frequent redirection.  Problem Solving Problem solving assist level: Solves basic 25 - 49% of the time - needs direction more than half the time to initiate, plan or complete simple activities  Memory Memory assist level: Recognizes or recalls 25 - 49% of the time/requires cueing 50 - 75% of the time    Pain Pain Assessment Pain Assessment: No/denies pain  Therapy/Group: Individual Therapy  Kron Everton, Melanee SpryNicole L 05/06/2016, 3:35 PM

## 2016-05-06 NOTE — Progress Notes (Signed)
PA notified of patient comments" "Just let me die; give me something strong-some death medicine; call Dr. Parks RangerKavorkian; call Dr. Death, let me jump out the window."  Instructed to bring patient to the desk for a while in between therapy sessions.  CSW notified of patient's mood.

## 2016-05-06 NOTE — Progress Notes (Signed)
Physical Therapy Session Note  Patient Details  Name: Ernest [name] MRN: [id number] Date of Birth: [birth year]  Today's Date: 05/06/2016 PT Individual Time: 1100-1200 AND 1300-1330 PT Individual Time Calculation (min): 60 min AND 30 min   Short Term Goals: Week 2:  PT Short Term Goal 1 (Week 2): Pt will perform bed mobility with min A PT Short Term Goal 2 (Week 2): Pt will perform bed <> chair transfers with supervision squat pivot, min A stand pivot with LRAD PT Short Term Goal 3 (Week 2): Pt will perform gait x 100' in controlled environment with LRAD and mod A PT Short Term Goal 4 (Week 2): Pt will negotiate 4 steps with min A  Skilled Therapeutic Interventions/Progress Updates:  Pt received at RN station in w/c; pt more awake and alert today but pt very verbose and perseverative on his depression and stating, "I'm never going home!", "this is worse than death!", "why doesn't God just take me out of the world," "just push me to a window and let me jump out, give me an overdose on drugs, give me a gun!".  Attempted to re-direct and sustain attention pt through conversation and functional task of picking out clothing, donning clothing, playing jazz music, transfer to simulated car and stair negotiation.  Pt was able to assist with donning clothing appropriately but refused to perform simulated car transfer and stair negotiation.  Rehab tech offered pt emotional support and offered to pray with pt; able to re-direct pt conversation to praying and when stating that his comments were upsetting other patients in the gym.  Pt apologetic.  Pt performed ambulation with bilat HHA x 100' +100' in controlled environment with one seated rest break.  At end of session pt left in recliner with LE elevated, quick release belt in place and NT to assist with lunch.  Pt also requesting to call his wife, dialed # for pt and pt able to connect with wife via phone.     PM session: Pt's wife present and pt demonstrating  decreased verbalization of depression and wishes to die, pt more willing to participate.  Engaged pt in transfer training recliner >w/c<> simulated car stand pivot with min-mod A with verbal and tactile cues to locate chair or car to L and verbal cues for sequencing.  Required min A and verbal cues to sequence placing LE into and out of car.  Pt and wife performed return demonstration with therapist providing cues for hand placement, sequencing and cuing.  Pt also educated on negotiation of one step with RW with pt, therapist and wife return demonstrating x 2 reps with mod A and total verbal cues for sequence.  Wife reports she and her family will bump pt up/down one step in w/c.  Discussed equipment needs.  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  Pt left in w/c with quick release belt in place and all items within reach to await OT session.  Therapy Documentation Precautions:  Precautions Precautions: Fall Precaution Comments: Swallowing precautions Restrictions Weight Bearing Restrictions: No Vital Signs: Therapy Vitals BP: (!) 145/74 Patient Position (if appropriate): Lying Pain:  No c/o pain  See Function Navigator for Current Functional Status.   Therapy/Group: Individual Therapy  Ernest [name], Ernest [name] [facility] 05/06/2016, 12:27 PM

## 2016-05-07 ENCOUNTER — Inpatient Hospital Stay (HOSPITAL_COMMUNITY): Payer: Medicare Other | Admitting: Physical Therapy

## 2016-05-07 ENCOUNTER — Inpatient Hospital Stay (HOSPITAL_COMMUNITY): Payer: Medicare Other | Admitting: Occupational Therapy

## 2016-05-07 ENCOUNTER — Inpatient Hospital Stay (HOSPITAL_COMMUNITY): Payer: Medicare Other | Admitting: Speech Pathology

## 2016-05-07 DIAGNOSIS — I69319 Unspecified symptoms and signs involving cognitive functions following cerebral infarction: Secondary | ICD-10-CM

## 2016-05-07 LAB — GLUCOSE, CAPILLARY
GLUCOSE-CAPILLARY: 191 mg/dL — AB (ref 65–99)
GLUCOSE-CAPILLARY: 119 mg/dL — AB (ref 65–99)
GLUCOSE-CAPILLARY: 222 mg/dL — AB (ref 65–99)
Glucose-Capillary: 129 mg/dL — ABNORMAL HIGH (ref 65–99)

## 2016-05-07 MED ORDER — FUROSEMIDE 40 MG PO TABS
40.0000 mg | ORAL_TABLET | Freq: Two times a day (BID) | ORAL | Status: DC
  Administered 2016-05-07 – 2016-05-16 (×17): 40 mg via ORAL
  Filled 2016-05-07 (×18): qty 1

## 2016-05-07 MED ORDER — LISINOPRIL 40 MG PO TABS
40.0000 mg | ORAL_TABLET | Freq: Every day | ORAL | Status: DC
  Administered 2016-05-07 – 2016-05-16 (×9): 40 mg via ORAL
  Filled 2016-05-07 (×10): qty 1

## 2016-05-07 MED ORDER — POTASSIUM CHLORIDE CRYS ER 20 MEQ PO TBCR
40.0000 meq | EXTENDED_RELEASE_TABLET | Freq: Three times a day (TID) | ORAL | Status: DC
  Administered 2016-05-07 – 2016-05-16 (×26): 40 meq via ORAL
  Filled 2016-05-07 (×26): qty 2

## 2016-05-07 NOTE — Progress Notes (Signed)
Occupational Therapy Session Note  Patient Details  Name: Ernest [name] MRN: [id number] Date of Birth: 01/13/1956  Today's Date: 05/07/2016 OT Individual Time: 7829-56210730-0830 OT Individual Time Calculation (min): 60 min     Short Term Goals: Week 2:  OT Short Term Goal 1 (Week 2): Pt will complete toilet transfers with min assist and LRAD OT Short Term Goal 2 (Week 2): Pt will visual scan environment to locate 3 items with min verbal prompts OT Short Term Goal 3 (Week 2): Pt will complete LB dressing with mod assist OT Short Term Goal 4 (Week 2): Pt will complete bathing with mod assist and moderate verbal cues for sequencing and completion  Skilled Therapeutic Interventions/Progress Updates:    Treatment session with focus on functional mobility, transfers, visual scanning and attention to task.  Pt in bed upon arrival willing to engage in bathing at shower level.  Completed stand pivot transfers with hand over hand to locate destination (w/c, toilet, and shower chair) prior to transferring.  Pt maintained eyes closed throughout majority of session, but able to follow directions and demonstrated increased initiation to complete self-care tasks.  Pt tearful during session stating "I've lost my family" and requiring increased time to console and reorient to situation.  Pt required backward and forward chaining during dressing tasks due to impaired vision.  Setup for breakfast with initial hand over hand to scoop food and bring to mouth with therapist able to progress to assisting with scooping food and then pt bringing food to mouth.  Left seated upright in w/c with nurse tech present to finish breakfast.  Therapy Documentation Precautions:  Precautions Precautions: Fall Precaution Comments: Swallowing precautions Restrictions Weight Bearing Restrictions: No General:   Vital Signs: Therapy Vitals BP: (!) 151/89 Patient Position (if appropriate): Sitting Pain: Pain Assessment Pain  Assessment: No/denies pain  See Function Navigator for Current Functional Status.   Therapy/Group: Individual Therapy  Rehmat Murtagh 05/07/2016, 11:00 AM

## 2016-05-07 NOTE — Progress Notes (Addendum)
[facility] PHYSICAL MEDICINE & REHABILITATION     PROGRESS NOTE    Subjective/Complaints: Sleeping but arouses to voice an cooperates with exam  ROS limited by mental status, but appears to deny CP, SOB, N/V/D.  Objective: Vital Signs: Blood pressure (!) 173/89, pulse 81, temperature 98.4 F (36.9 C), temperature source Oral, resp. rate 18, height 5\' 9"  (1.753 m), weight 89.7 kg (197 lb 12 oz), SpO2 97 %. No results found.  Recent Labs  05/06/16 1812  WBC 4.5  HGB 12.1*  HCT 36.5*  PLT 181    Recent Labs  05/06/16 1812  NA 134*  K 3.2*  CL 101  GLUCOSE 156*  BUN 20  CREATININE 1.04  CALCIUM 8.7*   CBG (last 3)   Recent Labs  05/06/16 1159 05/06/16 1628 05/06/16 2140  GLUCAP 163* 128* 189*    Wt Readings from Last 3 Encounters:  05/07/16 89.7 kg (197 lb 12 oz)  04/26/16 117 kg (258 lb)  08/18/13 89.1 kg (196 lb 6.4 oz)    Physical Exam:  Constitutional: He appears well-developed and well-nourished.  HENT: Normocephalic and atraumatic.  Eyes: Conjunctivae and EOM are normal.   Cardiovascular: Normal rate and regular rhythm.   Respiratory: Effort normal and breath sounds normal. No respiratory distress. He has no wheezes. He has no rales.  GI: Soft. Bowel sounds are normal. He exhibits no distension. There is no tenderness.  Musculoskeletal: He exhibits edema (1+ to 2+ pitting edema LLE--improving).  Neurological: He is alert.  Follows commands. Expressive aphasia Still with poor insight and awareness.    Left inattention Motor: LUE 4+/5 proximal to distal LLE: 4/5 hip flexion, 5/5 knee extension, 5/5 ankle dorsi/plantflexion.   RUE/RLE: 5/5  Skin: Skin is warm and dry. No rash noted. No erythema.  Psychiatric:  Flat, does not make eye contact.   Assessment/Plan: 1. Cognitive, mobility, and visual-spatial deficits secondary to right parietal-temporal ICH which require 3+ hours per day of interdisciplinary therapy in a comprehensive inpatient  rehab setting. Physiatrist is providing close team supervision and 24 hour management of active medical problems listed below. Physiatrist and rehab team continue to assess barriers to discharge/monitor patient progress toward functional and medical goals.  Function:  Bathing Bathing position   Position: Bed (bed for perineal area, seated at sink for UB)  Bathing parts Body parts bathed by patient: Right arm, Left arm, Chest, Abdomen, Front perineal area Body parts bathed by helper: Buttocks, Back  Bathing assist Assist Level:  (Mod assist)      Upper Body Dressing/Undressing Upper body dressing   What is the patient wearing?: Pull over shirt/dress     Pull over shirt/dress - Perfomed by patient: Put head through opening, Pull shirt over trunk Pull over shirt/dress - Perfomed by helper: Thread/unthread left sleeve, Thread/unthread right sleeve        Upper body assist Assist Level:  (Mod assist)      Lower Body Dressing/Undressing Lower body dressing   What is the patient wearing?: Non-skid slipper socks         Non-skid slipper socks- Performed by patient: Don/doff right sock, Don/doff left sock Non-skid slipper socks- Performed by helper: Don/doff right sock, Don/doff left sock               TED Hose - Performed by helper: Don/doff right TED hose, Don/doff left TED hose  Lower body assist Assist for lower body dressing: Touching or steadying assistance (Pt > 75%), Supervision or verbal cues  Toileting Toileting Toileting activity did not occur: N/A   Toileting steps completed by helper: Adjust clothing prior to toileting, Performs perineal hygiene, Adjust clothing after toileting Toileting Assistive Devices: Grab bar or rail  Toileting assist Assist level: Two helpers   Transfers Chair/bed transfer   Chair/bed transfer method: Stand pivot Chair/bed transfer assist level: Moderate assist (Pt 50 - 74%/lift or lower) Chair/bed transfer assistive device:  Armrests     Locomotion Ambulation     Max distance: 100 Assist level: 2 helpers   Wheelchair   Type: Manual Max wheelchair distance: 50 Assist Level: Dependent (Pt equals 0%)  Cognition Comprehension Comprehension assist level: Understands basic 50 - 74% of the time/ requires cueing 25 - 49% of the time  Expression Expression assist level: Expresses basic 90% of the time/requires cueing < 10% of the time.  Social Interaction Social Interaction assist level: Interacts appropriately 25 - 49% of time - Needs frequent redirection.  Problem Solving Problem solving assist level: Solves basic 25 - 49% of the time - needs direction more than half the time to initiate, plan or complete simple activities  Memory Memory assist level: Recognizes or recalls 50 - 74% of the time/requires cueing 25 - 49% of the time   Medical Problem List and Plan: 1.  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              -continue CIR therapies PT, OT SLP, team conference in am  -substantial visual-perceptual deficits 2.  DVT Prophylaxis/Anticoagulation: Mechanical: Sequential compression devices, below knee Bilateral lower extremities, lovenox 40 qd 3. Pain Management: tylenol and percocet prn. Limit narcs as possible.  -headaches are persistent but now tolerable  -topamax stopped due to poor tolerance/sedation 4. Mood: with improved insight/awareness able to discuss his mood  -Cymbalta  (appears to have been started on [facility]) increased to .  -neuropsych  -team to provide emotional support also 5. Neuropsych: This patient is not capable of making decisions on his own behalf.  -continue ritalin for arousal and to improve attention  Now on prozac   for mood 6. Skin/Wound Care: local pressure relief, elevate lower ext                       -optimize nutrition 7. Fluids/Electrolytes/Nutrition:   Dysphagia  2 thin  -K+ 3.4 0n 8/1, 3.2 on 8/7  -repleting K+, increase to tid              -follow I's and O's daily 8. Urine retention: removed foley.                       -improved but incont 9. Fluid overload- lasix  BID--no LE edema will reduce to  BID                       -daily weights slightly unreliable, but overall trending down                       -follow I and O's Filed Weights   05/05/16 0559 05/06/16 0529 05/07/16 0500  Weight: 91.1 kg (200 lb 13.4 oz) 91.1 kg (200 lb 13.4 oz) 89.7 kg (197 lb 12 oz)  10. DM2              -lantus-- increased to 10u bid (on 15u at home)  -sugars improved although  po intake is inconsistent  -SSI   CBG (last 3)   Recent Labs  05/06/16 1159 05/06/16 1628 05/06/16 2140  GLUCAP 163* 128* 189*    11. HTN:  -continue hydralazine, lasix, catapres, lisinopril Vitals:   05/06/16 2040 05/07/16 0500  BP: (!) 155/85 (!) 173/89  Pulse:  81  Resp:  18  Temp:  98.4 F (36.9 C)     -catapres 0.2mg  tid, increased to 0.3mg  on 8/6   hydralazine increased to 100 on 8/5                       -Lisinopril increased to 40mg  qd on 8/8 12. Seizure proph: keppra 500mg  bid 13. ABLA--hgb trending up  -10.7 on 8/1   LOS (Days) 11 A FACE TO FACE EVALUATION WAS PERFORMED  Erick ColaceKIRSTEINS,ANDREW E 05/07/2016 6:40 AM

## 2016-05-07 NOTE — Progress Notes (Signed)
Speech Language Pathology Daily Session Note  Patient Details  Name: Ernest [name] MRN: [id number] Date of Birth: 05-21-1956  Today's Date: 05/07/2016 SLP Individual Time: 0905-1000     Short Term Goals: Week 2: SLP Short Term Goal 1 (Week 2): Pt will demonstrate general orientation x 4 with min A. SLP Short Term Goal 2 (Week 2): Pt will gaze shift to midline with max A in 3/4 opportunities.   SLP Short Term Goal 3 (Week 2): Pt will tolerate trials of Dys 3 with adequate oral clearance with min A. SLP Short Term Goal 4 (Week 2): Pt will demonstrate basic problem solving during functional tasks with mod A. SLP Short Term Goal 5 (Week 2): Pt will attend to task for 5 minutes with mod A.   Skilled Therapeutic Interventions:  Pt was seen for skilled ST targeting cognitive goals.  Pt labile again today, tearful and asking therapist to tell wife that he loved her.  Despite lability pt was much more easily redirectable today in comparison to yesterday's therapy session.  Therapist facilitated the session with a basic card sorting task targeting visual scanning to the left of midline and task initiation.  Pt was able to sort cards from the left to the right with max assist verbal and visual cues.  Min assist was needed for task initiation.  Pt was returned to room and left with call bell within reach and quick release belt donned for safety.  Continue per current plan of care.    Function:  Eating Eating                 Cognition Comprehension Comprehension assist level: Understands basic 50 - 74% of the time/ requires cueing 25 - 49% of the time  Expression   Expression assist level: Expresses basic 90% of the time/requires cueing < 10% of the time.  Social Interaction Social Interaction assist level: Interacts appropriately 25 - 49% of time - Needs frequent redirection.  Problem Solving Problem solving assist level: Solves basic 25 - 49% of the time - needs direction more than half the  time to initiate, plan or complete simple activities  Memory Memory assist level: Recognizes or recalls 25 - 49% of the time/requires cueing 50 - 75% of the time    Pain Pain Assessment Pain Assessment: No/denies pain  Therapy/Group: Individual Therapy  Johannes Everage, Melanee SpryNicole L 05/07/2016, 10:01 AM

## 2016-05-07 NOTE — Progress Notes (Signed)
Physical Therapy Session Note  Patient Details  Name: Ernest [name] MRN: [id number] Date of Birth: 05-10-1956  Today's Date: 05/07/2016 PT Individual Time: [id number] PT Individual Time Calculation (min): 58 min    Skilled Therapeutic Interventions/Progress Updates:    30 minutes of session was for scheduled make up time.   Pt received in bed & agreeable to PT, pt without c/o pain. Throughout session pt perseverated on "Where's my boys? Where's Trudy?" with PT reorienting pt of location & situation. Pt able to transfer supine>sitting EOB with supervision and use of bed rails. PT donned pt's shoes total A for time management & pt completed stand pivot bed>w/c with RW & min A with PT instructing pt on sequence. Transported pt to gym via w/c total A for time management. Gait training in gym x 80 ft with RW & min A to steer RW & for balance; attempted to have pt locate bright orange cones to L when ambulating but pt unable to do so even with maximum multimodal cuing. Pt transferred onto nu-step with maximum assistance for BLE & BUE placement on device; performed Level 3 x 12 minutes for cardiovascular endurance training. Gait training x 100 ft back to room with RW & Min A for balance & to steer RW; pt demonstrated decreased step length & decreased gait speed. Provided cuing for increased step length but pt with fair/poory carryover. In hallway pt able to locate room door on his left with minimal cuing to turn head to left; pt with more difficulty locating bed in room in this manner. Pt transferred sitting EOB>supine with maximum multimodal cuing for sequencing but pt able to scoot to head of bed without cuing for technique. Pt able to successfully demonstrate use of pancake call bell. At end of session pt left in bed with all needs within reach, NT present, & bed alarm set.   Therapy Documentation Precautions:  Precautions Precautions: Fall Precaution Comments: Swallowing  precautions Restrictions Weight Bearing Restrictions: No  Pain: Pain Assessment Pain Assessment: No/denies pain   See Function Navigator for Current Functional Status.   Therapy/Group: Individual Therapy  Ernest [name] 05/07/2016, 4:06 PM

## 2016-05-07 NOTE — Progress Notes (Signed)
Physical Therapy Session Note  Patient Details  Name: Ernest [name] MRN: [id number] Date of Birth: [birth year]  Today's Date: 05/07/2016 PT Individual Time: 4098-11910848-0904 (make up minutes) AND [id number] PT Individual Time Calculation (min): 16 min AND 60 min   Short Term Goals: Week 2:  PT Short Term Goal 1 (Week 2): Pt will perform bed mobility with min A PT Short Term Goal 2 (Week 2): Pt will perform bed <> chair transfers with supervision squat pivot, min A stand pivot with LRAD PT Short Term Goal 3 (Week 2): Pt will perform gait x 100' in controlled environment with LRAD and mod A PT Short Term Goal 4 (Week 2): Pt will negotiate 4 steps with min A  Skilled Therapeutic Interventions/Progress Updates:   First session: pt finishing breakfast with NT; pt seen for make up session due to missed treatment time.  Pt less verbose today but still emotionally labile with pt crying today and stating, "I am sad!  I am all alone!" but more willing to participate in therapy.  Pt performed sit > stand from w/c and ambulated with RW to/from toilet x 25' x 2 with min-mod A with therapist guiding RW and providing pt with verbal cues for environment and obstacle negotiation.  Pt able to stand with min A and doff/don pants in standing and verbal cues to attend to L side and use of LUE.  Pt able to void while on toilet but continued to cry.  Provided pt with emotional support and encouragement.  Returned to w/c to rest and await SLP for next session.  Second AM session: Pt received in w/c with wife present to participate in therapy.  Assisted pt with donning sweat shirt and attempted to have pt visually scan to midline to choose a pair of shoes; pt able to choose by feeling the shoe.  Performed gait with RW x 150' in controlled environment with therapist decreasing assistance to navigate RW and attempting to guide pt with head placement in midline and verbal cues but pt required intermittent mod A.  Performed stair  negotiation training up/down 4 stairs with 2 rails and mod A to facilitate full weight shifting laterally and anterior and for full LE clearance and advancement.  Performed bed mobility training on flat mat, no rails with pt performing sit <> supine (to L side) and supine <> rolling to L side with min A overall and verbal cues for sequencing, attention to LLE placement and safety.  Continued visual scanning training to locate brightly colored objects and cups on table attempting to use visual feedback of mirror or proprioceptive feedback of UE placement on table but pt unable to locate items visually or sustain gaze stability on object.  Returned to room ambulating with RW and min-mod A.  Pt left in w/c with wife and pastor present.  Therapy Documentation Precautions:  Precautions Precautions: Fall Precaution Comments: Swallowing precautions Restrictions Weight Bearing Restrictions: No General:   Vital Signs: Therapy Vitals BP: (!) 151/89 Pain: Pain Assessment Pain Assessment: No/denies pain   See Function Navigator for Current Functional Status.   Therapy/Group: Individual Therapy  Edman CircleHall, Ernest [name] [facility]aucette 05/07/2016, 10:34 AM

## 2016-05-08 ENCOUNTER — Inpatient Hospital Stay (HOSPITAL_COMMUNITY): Payer: Medicare Other | Admitting: Physical Therapy

## 2016-05-08 ENCOUNTER — Inpatient Hospital Stay (HOSPITAL_COMMUNITY): Payer: Medicare Other | Admitting: Occupational Therapy

## 2016-05-08 ENCOUNTER — Inpatient Hospital Stay (HOSPITAL_COMMUNITY): Payer: Medicare Other | Admitting: Speech Pathology

## 2016-05-08 LAB — GLUCOSE, CAPILLARY
GLUCOSE-CAPILLARY: 180 mg/dL — AB (ref 65–99)
GLUCOSE-CAPILLARY: 166 mg/dL — AB (ref 65–99)
GLUCOSE-CAPILLARY: 192 mg/dL — AB (ref 65–99)
Glucose-Capillary: 103 mg/dL — ABNORMAL HIGH (ref 65–99)

## 2016-05-08 MED ORDER — METHYLPHENIDATE HCL 5 MG PO TABS
10.0000 mg | ORAL_TABLET | Freq: Two times a day (BID) | ORAL | Status: DC
  Administered 2016-05-08 – 2016-05-13 (×11): 10 mg via ORAL
  Filled 2016-05-08 (×11): qty 2

## 2016-05-08 MED ORDER — AMLODIPINE BESYLATE 5 MG PO TABS
5.0000 mg | ORAL_TABLET | Freq: Every day | ORAL | Status: DC
  Administered 2016-05-08 – 2016-05-16 (×8): 5 mg via ORAL
  Filled 2016-05-08 (×9): qty 1

## 2016-05-08 NOTE — Progress Notes (Signed)
Speech Language Pathology Daily Session Note  Patient Details  Name: Ernest [name] MRN: [id number] Date of Birth: January 23, 1956  Today's Date: 05/08/2016 SLP Individual Time: 1345-1440 SLP Individual Time Calculation (min): 55 min   Short Term Goals:Week 2: SLP Short Term Goal 1 (Week 2): Pt will demonstrate general orientation x 4 with min A. SLP Short Term Goal 2 (Week 2): Pt will gaze shift to midline with max A in 3/4 opportunities.   SLP Short Term Goal 3 (Week 2): Pt will tolerate trials of Dys 3 with adequate oral clearance with min A. SLP Short Term Goal 4 (Week 2): Pt will demonstrate basic problem solving during functional tasks with mod A. SLP Short Term Goal 5 (Week 2): Pt will attend to task for 5 minutes with mod A.   Skilled Therapeutic Interventions:  Pt was seen for skilled ST targeting cognitive and dysphagia goals.  Pt asleep upon arrival, requiring max encouragement to wake up and participate in therapy.  Once awakened, pt was perseverative on the phrase "I'm grumpy" and required max assist multimodal cues for redirection.  Pt was oriented to place, month, and situation with min assist question cues.  Therapist facilitated the session with trials of dys 3 textures to continue working towards diet progression.  Pt consumed advanced textures with max assist multimodal cues for rate and portion control due to impulsivity.  Despite large portions, pt was able to clear oral cavity of advanced solids with increased time.  No overt s/s of aspiration evident with solids or liquids; however, do not recommend diet advancement at this time due to cognition.  Hand over hand assistance was needed for initiation of self feeding during the abovementioned trials.  Pt was returned to room and left in bed with bed alarm set and call bell within reach.  Continue per current plan of care.     Function:  Eating Eating   Modified Consistency Diet: Yes Eating Assist Level: Hand over hand assist    Eating Set Up Assist For: Opening containers;Cutting food Helper Scoops Food on Utensil: Every scoop (initiated with hand over hand then helper scooped every bite)     Cognition Comprehension Comprehension assist level: Understands basic 50 - 74% of the time/ requires cueing 25 - 49% of the time  Expression   Expression assist level: Expresses basic 50 - 74% of the time/requires cueing 25 - 49% of the time. Needs to repeat parts of sentences.  Social Interaction Social Interaction assist level: Interacts appropriately 25 - 49% of time - Needs frequent redirection.  Problem Solving Problem solving assist level: Solves basic 25 - 49% of the time - needs direction more than half the time to initiate, plan or complete simple activities  Memory Memory assist level: Recognizes or recalls less than 25% of the time/requires cueing greater than 75% of the time    Pain Pain Assessment Pain Assessment: No/denies pain  Therapy/Group: Individual Therapy  Heli Dino, Melanee SpryNicole L 05/08/2016, 2:43 PM

## 2016-05-08 NOTE — Progress Notes (Signed)
Occupational Therapy Session Note  Patient Details  Name: Ernest [name] MRN: [id number] Date of Birth: 04-Feb-1956  Today's Date: 05/08/2016 OT Individual Time: 4098-11910730-0825 OT Individual Time Calculation (min): 55 min     Short Term Goals: Week 2:  OT Short Term Goal 1 (Week 2): Pt will complete toilet transfers with min assist and LRAD OT Short Term Goal 2 (Week 2): Pt will visual scan environment to locate 3 items with min verbal prompts OT Short Term Goal 3 (Week 2): Pt will complete LB dressing with mod assist OT Short Term Goal 4 (Week 2): Pt will complete bathing with mod assist and moderate verbal cues for sequencing and completion  Skilled Therapeutic Interventions/Progress Updates:    Treatment session with focus on ADL retraining, visual attention, and sustained attention to task.  Pt in bed upon arrival agreeable to participating in treatment session.  Pt demonstrating confusion, asking for his wife and his sons, reoriented to place and situation but with little carryover.  Completed dressing at sit > stand from EOB.  Pt with improved attention during dressing, requiring mod multimodal cues to locate pants and shirt, requiring assist to start shirt with pt able to complete with increased time.  Pt began perseverating on the need to "get going" and being late for work.  Again reoriented to time, place, and situation.  Pt spontaneously scanned to Lt (briefly) to pass therapist toothbrush after completing oral hygiene at sink.  Setup and cues to complete task with cues to locate sink to spit.  Attempted to engage in visual scanning and attention with Dynavision.  Positioned pt with Dynavision board in his Rt visual field (seated at a 60 degree angle to board) to address visual attention and locating items in Rt visual field progressing to just Rt of midline.  Pt able to locate one light in Rt visual field with reaching towards it, but over and undershooting it with each attempt.  Completed 3  trials with pt unable to successfully press any lights, despite identifying stimulus in Rt visual field.  Returned to room and left in recliner with all needs in reach and demonstration of use of soft call bell.  Therapy Documentation Precautions:  Precautions Precautions: Fall Precaution Comments: Swallowing precautions Restrictions Weight Bearing Restrictions: No General:   Vital Signs: Therapy Vitals Temp: 98.1 F (36.7 C) Temp Source: Oral Pulse Rate: 74 Resp: 16 BP: (!) 151/78 Patient Position (if appropriate): Lying Oxygen Therapy SpO2: 98 % O2 Device: Not Delivered Pain:   Pt with no c/o pain  See Function Navigator for Current Functional Status.   Therapy/Group: Individual Therapy  Ernest [name], [name] 05/08/2016, 8:28 AM

## 2016-05-08 NOTE — Patient Care Conference (Signed)
Inpatient RehabilitationTeam Conference and Plan of Care Update Date: 05/09/2016   Time: 11:50 AM    Patient Name: Ernest [name]      Medical Record Number: [id number]  Date of Birth: 01/04/1956 Sex: Male         Room/Bed: 4W15C/4W15C-01 Payor Info: Payor: MEDICARE / Plan: MEDICARE PART A AND B / Product Type: *No Product type* /    Admitting Diagnosis: RT ICH  Admit Date/Time:  04/26/2016  2:24 PM Admission Comments: No comment available   Primary Diagnosis:  Nontraumatic cortical hemorrhage of right cerebral hemisphere [facility]) Principal Problem: Nontraumatic cortical hemorrhage of right cerebral hemisphere [facility])  Patient Active Problem List   Diagnosis Date Noted  . Adjustment disorder with depressed mood   . Other hypervolemia   . 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 Abscess of right arm 08/18/2013  . Essential hypertension 08/18/2013  . Diabetes mellitus type 2 in nonobese (HCC) 08/18/2013  . Thrombocytopenia, unspecified (HCC) 08/18/2013    Expected Discharge Date: Expected Discharge Date: 05/17/16  Team Members Present: Physician leading conference: Dr. Claudette Laws Social Worker Present: Dossie Der, [name] Nurse Present: Other (comment) Romie Jumper Evans-RN) PT Present: Aleda Grana, PT OT Present: Roney Mans, Suszanne Conners, OT SLP Present: Jackalyn Lombard, SLP PPS Coordinator present : Tora Duck, RN, CRRN     Current Status/Progress Goal Weekly Team Focus  Medical   Right ICH, poor awareness of deficits, severe cognitive deficits with perseveration. Headaches have improved, , with severe left neglect  Reduce left neglect, improved awareness of deficits  Address mood issues, have adjusted Cymbalta   Bowel/Bladder   Pt continent and incontinent at times LBM 05/06/16  mod assist  cont. to monitor q shift   Swallow/Nutrition/ Hydration   Dys 2, thin liquids   supervision   continue  working towards trials of upgraded textures per improved mentation, family education    ADL's   min/mod assist transfers, mod assist bathing and dressing due to impaired vision and decreased attention  Min A transfers, toileting, LB dressing, standing; Supervision sitting, UB dressing, grooming  attention to task, visual attention/scanning, safety awareness, functional transfers, ADL retraining   Mobility   Min-mod A overall  Min A due to visual deficits  cognition, attention, visual scanning, transfer and gait training, balance   Communication             Safety/Cognition/ Behavioral Observations  severe cognitive deficits, limited this reporting period by perseveration on depression, max to total assist for basic tasks due to visuperceptual deficits   min assist, may need to be downgraded   continue to address basic cognition    Pain   c/o headaches at times  pain less than or equal to 3  continue to monitor q shift   Skin   abraisions to penis; BLE 2 +  no new skin breakdown this admission  continue to monitor q shift    Rehab Goals Patient on target to meet rehab goals: Yes Rehab Goals Revised: w/c goals have been d/c'd due to visual deficits  *See Care Plan and progress notes for long and short-term goals.  Barriers to Discharge: See above, severe cognitive and visual spatial awareness issues    Possible Resolutions to Barriers:  Continue rehabilitation    Discharge Planning/Teaching Needs:  Pt plans to return home with his wife, sons, and sister-in-law/brother-in-law to assist.  Pt's family will come in for family education closer to d/c.  Team Discussion:  Progressing toward his goals of min assist level. Continues to need to be re-directed and perseverating on his depression. Attention poor-MD increased his ritalin dose. Timed toileitng to help with continence. Can not get to a higher level due to attention and visual issues. Will need to do family education with wife.   Revisions to Treatment Plan:  None   Continued Need for Acute Rehabilitation Level of Care: The patient requires daily medical management by a physician with specialized training in physical medicine and rehabilitation for the following conditions: Daily direction of a multidisciplinary physical rehabilitation program to ensure safe treatment while eliciting the highest outcome that is of practical value to the patient.: Yes Daily medical management of patient stability for increased activity during participation in an intensive rehabilitation regime.: Yes Daily analysis of laboratory values and/or radiology reports with any subsequent need for medication adjustment of medical intervention for : Neurological problems;Blood pressure problems  Haizel Gatchell, Vista DeckJennifer Capps 05/09/2016, 11:50 AM

## 2016-05-08 NOTE — Progress Notes (Signed)
Physical Therapy Session Note  Patient Details  Name: Ernest [name] MRN: [id number] Date of Birth: 05/30/1956  Today's Date: 05/08/2016 PT Individual Time: 0912-1000 AND 1500-1501. PT Individual Time Calculation (min): 48 min   Short Term Goals: Week 1:  PT Short Term Goal 1 (Week 1): Pt will perform gait with mod A 25' in controlled environment PT Short Term Goal 1 - Progress (Week 1): Met PT Short Term Goal 2 (Week 1): pt will perform functional transfers consistently at mod A PT Short Term Goal 2 - Progress (Week 1): Met PT Short Term Goal 3 (Week 1): pt will demo intellectual awareness with mod A PT Short Term Goal 3 - Progress (Week 1): Partly met Week 2:  PT Short Term Goal 1 (Week 2): Pt will perform bed mobility with min A PT Short Term Goal 2 (Week 2): Pt will perform bed <> chair transfers with supervision squat pivot, min A stand pivot with LRAD PT Short Term Goal 3 (Week 2): Pt will perform gait x 100' in controlled environment with LRAD and mod A PT Short Term Goal 4 (Week 2): Pt will negotiate 4 steps with min A   Skilled Therapeutic Interventions/Progress Updates:    Session 1.   Patient received sitting in recliner with with trade off from RN and medication administration.   Gait training in hall for 125ft, 75ft, abd 60ft, with RW and mod A from PT from for RW management, and prevention of fall x 2 with removal of 1 hand from hand grip. PT provided max multimodal  improved attention to L with visual scanning beyond neutral, improved head orientation to midline, and proper use of RW with BUE.   Patient requested to use restroom 3 times for bowel movements. Gait training in room for 10ft x 3 to transfer to bathroom with Mod A and UE support from therapist. Max cues and mod A for toilet transfer x 3 from PT using rail in bathroom. All 3 bowel movements noted to be loose, RN made aware. PT performed hygiene with mini squat and 1 UE supported on rail. Following each bowel  movement, patient performed hand hygiene standing at sink with min-mod A and max cues for improved visual scanning to L to find soap, water from facet, and to turn on hot water with forward reach.   Patient left sitting in chair with call bell in reach and quick release belt in place.   Session 2: PT attempted to see patient for second treatment. Patient received asleep in bed and requested 5 more minutes. PT returned in 5 minutes;  Patient refused to get out of bed or participate in bed level activity stating that he " is grumpy, and cant move". PT educated patient on importance of continued exercises and out of bed activity for long term improvement, but patient continued to decline therapy.     Therapy Documentation Precautions:  Precautions Precautions: Fall Precaution Comments: Swallowing precautions Restrictions Weight Bearing Restrictions: No General: PT Amount of Missed Time (min): 30 Minutes AND 12 minutes PT Missed Treatment Reason: Patient unwilling to participate AND Nursing car.  Vital Signs: Therapy Vitals Temp: 98.3 F (36.8 C) Temp Source: Oral Pulse Rate: 83 Resp: 18 BP: (!) 144/78 Patient Position (if appropriate): Lying Oxygen Therapy SpO2: 97 % O2 Device: Not Delivered Pain: 0/10  Pain Assessment Pain Assessment: No/denies pain  See Function Navigator for Current Functional Status.   Therapy/Group: Individual Therapy   E  05/08/2016, 5:03 PM  

## 2016-05-08 NOTE — Progress Notes (Signed)
Physical Therapy Session Note  Patient Details  Name: Ernest EagleWillie [name] MRN: [id number] Date of Birth: [birth year]  Today's Date: 05/08/2016 PT Individual Time: 1015-1040 PT Individual Time Calculation (min): 25 min    Short Term Goals: Week 2:  PT Short Term Goal 1 (Week 2): Pt will perform bed mobility with min A PT Short Term Goal 2 (Week 2): Pt will perform bed <> chair transfers with supervision squat pivot, min A stand pivot with LRAD PT Short Term Goal 3 (Week 2): Pt will perform gait x 100' in controlled environment with LRAD and mod A PT Short Term Goal 4 (Week 2): Pt will negotiate 4 steps with min A  Skilled Therapeutic Interventions/Progress Updates:   Pt received seated in recliner; denies pain however perseverating on being tired, and "I'm getting irritated". Unable to track/attend to L side despite max multi-modal cueing to locate w/c. Stand pivot transfer min guard to L side with max cueing to locate and sit in chair on L side. In gym, pt declined attempting to perform stairs due to being tired, irritated. Pt given short cognitive rest break, during which pt initiates standing without assist. Therapist attempted to encourage pt to ambulate on stairs, however pt reports he wants to go back to bed. Agreeable to ambulate towards room with RW and min guard, with cues and encouragement to continue ambulating; 30-40' total. Returned to room and stand pivot to return to bed min guard, again with max cueing to locate bed and sit safely d/t L inattention. Sit supine minA due to pt laying uneven in bed with feet hanging off. Remained supine in bed with alarm intact and all needs in reach.   Therapy Documentation Precautions:  Precautions Precautions: Fall Precaution Comments: Swallowing precautions Restrictions Weight Bearing Restrictions: No Pain: Pain Assessment Pain Assessment: No/denies pain   See Function Navigator for Current Functional Status.   Therapy/Group: Individual  Therapy  Vista Lawmanlizabeth J Tygielski 05/08/2016, 1:04 PM

## 2016-05-08 NOTE — Progress Notes (Signed)
[facility] PHYSICAL MEDICINE & REHABILITATION     PROGRESS NOTE    Subjective/Complaints: Pt not aware of "brain surgery"  ROS limited by mental status, but appears to deny CP, SOB, N/V/D.  Objective: Vital Signs: Blood pressure (!) 151/78, pulse 74, temperature 98.1 F (36.7 C), temperature source Oral, resp. rate 16, height _0  (1.753 m), weight 89.7 kg (197 lb 12 oz), SpO2 98 %. No results found.  Recent Labs  05/06/16 1812  WBC 4.5  HGB 12.1*  HCT 36.5*  PLT 181    Recent Labs  05/06/16 1812  NA 134*  K 3.2*  CL 101  GLUCOSE 156*  BUN 20  CREATININE 1.04  CALCIUM 8.7*   CBG (last 3)   Recent Labs  05/07/16 1646 05/07/16 2057 05/08/16 0643  GLUCAP 119* 222* 103*    Wt Readings from Last 3 Encounters:  05/08/16 89.7 kg (197 lb 12 oz)  04/26/16 117 kg (258 lb)  08/18/13 89.1 kg (196 lb 6.4 oz)    Physical Exam:  Constitutional: He appears well-developed and well-nourished.  HENT: Normocephalic and atraumatic.  Eyes: Conjunctivae and EOM are normal.   Cardiovascular: Normal rate and regular rhythm.   Respiratory: Effort normal and breath sounds normal. No respiratory distress. He has no wheezes. He has no rales.  GI: Soft. Bowel sounds are normal. He exhibits no distension. There is no tenderness.  Musculoskeletal: He exhibits edema (1+ to 2+ pitting edema LLE--improving).  Neurological: He is alert.  Follows commands. Expressive aphasia Still with poor insight and awareness.    Left inattention Motor: LUE 4+/5 proximal to distal LLE: 4/5 hip flexion, 5/5 knee extension, 5/5 ankle dorsi/plantflexion.   RUE/RLE: 5/5  Skin: Skin is warm and dry. No rash noted. No erythema.  Psychiatric:  Flat, does not make eye contact.   Assessment/Plan: 1. Cognitive, mobility, and visual-spatial deficits secondary to right parietal-temporal ICH which require 3+ hours per day of interdisciplinary therapy in a comprehensive inpatient rehab  setting. Physiatrist is providing close team supervision and 24 hour management of active medical problems listed below. Physiatrist and rehab team continue to assess barriers to discharge/monitor patient progress toward functional and medical goals.  Function:  Bathing Bathing position   Position: Shower  Bathing parts Body parts bathed by patient: Right arm, Left arm, Chest, Abdomen, Front perineal area, Right upper leg, Left upper leg Body parts bathed by helper: Buttocks, Right lower leg, Left lower leg, Back  Bathing assist Assist Level:  (Mod assist)      Upper Body Dressing/Undressing Upper body dressing   What is the patient wearing?: Pull over shirt/dress     Pull over shirt/dress - Perfomed by patient: Put head through opening, Pull shirt over trunk Pull over shirt/dress - Perfomed by helper: Thread/unthread left sleeve, Thread/unthread right sleeve        Upper body assist Assist Level:  (Mod assist)      Lower Body Dressing/Undressing Lower body dressing   What is the patient wearing?: Pants, Non-skid slipper socks     Pants- Performed by patient: Pull pants up/down Pants- Performed by helper: Thread/unthread right pants leg, Thread/unthread left pants leg Non-skid slipper socks- Performed by patient: Don/doff right sock, Don/doff left sock Non-skid slipper socks- Performed by helper: Don/doff right sock, Don/doff left sock               TED Hose - Performed by helper: Don/doff right TED hose, Don/doff left TED hose  Lower body assist  Assist for lower body dressing:  (Mod assist)      Toileting Toileting Toileting activity did not occur: N/A   Toileting steps completed by helper: Adjust clothing prior to toileting, Performs perineal hygiene, Adjust clothing after toileting Toileting Assistive Devices: Grab bar or rail  Toileting assist Assist level: Two helpers   Transfers Chair/bed transfer   Chair/bed transfer method: Stand pivot Chair/bed  transfer assist level: Touching or steadying assistance (Pt > 75%) Chair/bed transfer assistive device: Medical sales representative     Max distance: 100 ft Assist level: Touching or steadying assistance (Pt > 75%)   Wheelchair   Type: Manual Max wheelchair distance: 50 Assist Level: Dependent (Pt equals 0%)  Cognition Comprehension Comprehension assist level: Understands basic 50 - 74% of the time/ requires cueing 25 - 49% of the time  Expression Expression assist level: Expresses basic 90% of the time/requires cueing < 10% of the time.  Social Interaction Social Interaction assist level: Interacts appropriately 25 - 49% of time - Needs frequent redirection.  Problem Solving Problem solving assist level: Solves basic 25 - 49% of the time - needs direction more than half the time to initiate, plan or complete simple activities  Memory Memory assist level: Recognizes or recalls less than 25% of the time/requires cueing greater than 75% of the time   Medical Problem List and Plan: 1.  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              -continue CIR therapies PT, OT SLP, Team conference today please see physician documentation under team conference tab, met with team face-to-face to discuss problems,progress, and goals. Formulized individual treatment plan based on medical history, underlying problem and comorbidities.  -substantial visual-perceptual deficits 2.  DVT Prophylaxis/Anticoagulation: Mechanical: Sequential compression devices, below knee Bilateral lower extremities, lovenox 40 qd 3. Pain Management: tylenol and percocet prn. Limit narcs as possible.  -headaches are persistent but now tolerable  -topamax stopped due to poor tolerance/sedation 4. Mood: with improved insight/awareness able to discuss his mood  -Cymbalta 2m (appears to have been started on [facility]) increased to  46m  -neuropsych  -team to provide emotional support also 5. Neuropsych: This patient is not capable of making decisions on his own behalf.  -continue ritalin for arousal and to improve attention  D/c prozac 1034mGiven pt already on cymbalta,cymbalta just increased need to wait for effect 6. Skin/Wound Care: local pressure relief, elevate lower ext                       -optimize nutrition 7. Fluids/Electrolytes/Nutrition:   Dysphagia 2 thin  -K+ 3.4 0n 8/1, 3.2 on 8/7  -repleting K+, increase to 71m29mid              -follow I's and O's daily 8. Urine retention: removed foley.                       -improved but incont 9. Fluid overload- lasix 60mg68m--no LE edema will reduce to 71mg 79m                      -daily weights slightly unreliable, but overall trending down                       -follow I and O's Filed [employer]  05/06/16 0529 05/07/16 0500 05/08/16 0500  Weight: 91.1 kg (200 lb 13.4 oz) 89.7 kg (197 lb 12 oz) 89.7 kg (197 lb 12 oz)  10. DM2              -lantus-- increased to 10u bid (on 15u at home)  -sugars improved although po intake is inconsistent  -SSI   CBG (last 3)   Recent Labs  05/07/16 1646 05/07/16 2057 05/08/16 0643  GLUCAP 119* 222* 103*    11. HTN:  -continue hydralazine, lasix, catapres, lisinopril Vitals:   05/07/16 2158 05/08/16 0543  BP: (!) 156/88 (!) 151/78  Pulse:  74  Resp:  16  Temp:  98.1 F (36.7 C)     -catapres 0.32m tid, increased to 0.322mon 8/6   hydralazine increased to 100 on 8/5                       -Lisinopril increased to 4016md on 8/8   -add amlodipine 5mg1my 12. Seizure proph: keppra 500mg34m 13. ABLA--hgb trending up  -10.7 on 8/1   LOS (Days) 12 A FACE TO FACE EVALUATION WAS PERFORMED  KIRSTCharlett Blake2017 7:32 AM

## 2016-05-09 ENCOUNTER — Inpatient Hospital Stay (HOSPITAL_COMMUNITY): Payer: Medicare Other | Admitting: Occupational Therapy

## 2016-05-09 ENCOUNTER — Inpatient Hospital Stay (HOSPITAL_COMMUNITY): Payer: Medicare Other | Admitting: Speech Pathology

## 2016-05-09 ENCOUNTER — Inpatient Hospital Stay (HOSPITAL_COMMUNITY): Payer: Medicare Other | Admitting: Physical Therapy

## 2016-05-09 DIAGNOSIS — F4321 Adjustment disorder with depressed mood: Secondary | ICD-10-CM

## 2016-05-09 LAB — GLUCOSE, CAPILLARY
GLUCOSE-CAPILLARY: 203 mg/dL — AB (ref 65–99)
GLUCOSE-CAPILLARY: 157 mg/dL — AB (ref 65–99)
GLUCOSE-CAPILLARY: 209 mg/dL — AB (ref 65–99)
Glucose-Capillary: 88 mg/dL (ref 65–99)

## 2016-05-09 NOTE — Progress Notes (Signed)
Occupational Therapy Session Note  Patient Details  Name: Ernest [name] MRN: [id number] Date of Birth: March 21, 1956  Today's Date: 05/09/2016 OT Individual Time: 1531-1600 OT Individual Time Calculation (min): 29 min    Short Term Goals: Week 2:  OT Short Term Goal 1 (Week 2): Pt will complete toilet transfers with min assist and LRAD OT Short Term Goal 2 (Week 2): Pt will visual scan environment to locate 3 items with min verbal prompts OT Short Term Goal 3 (Week 2): Pt will complete LB dressing with mod assist OT Short Term Goal 4 (Week 2): Pt will complete bathing with mod assist and moderate verbal cues for sequencing and completion  Skilled Therapeutic Interventions/Progress Updates:    Upon entering the room, pt seated in wheelchair with no c/o pain. Pt requiring max cues for redirection this session and only able to be redirected for ~ 30 sec - 1 minute at a time. Pt very tearful this session and stating repeatedly, "Why does God want to kill me? I'm not ready to die."  Pt not oriented at this time. Pt agreeable to transfer from wheelchair >bed with min A for sit>stand from wheelchair with RW. Mod A stand pivot transfer into bed and pt needing min A to place L LE into bed. Bed alarm activated. Call bell and all needed items within reach upon exiting the room.   Therapy Documentation Precautions:  Precautions Precautions: Fall Precaution Comments: Swallowing precautions Restrictions Weight Bearing Restrictions: No Vital Signs: Therapy Vitals Temp: 98.3 F (36.8 C) Temp Source: Oral Pulse Rate: 85 Resp: 18 BP: 138/79 Patient Position (if appropriate): Sitting Oxygen Therapy SpO2: 99 % O2 Device: Not Delivered Pain: Pain Assessment Pain Assessment: No/denies pain  See Function Navigator for Current Functional Status.   Therapy/Group: Individual Therapy  Lowella Gripittman, Manaal Mandala L 05/09/2016, 4:02 PM

## 2016-05-09 NOTE — Consult Note (Signed)
PSYCHODIAGNOSTIC EVALUATION - [facility]IDENTIAL [facility]   Mr. Ernest [name] is a 60 year old man, who was seen for an initial psychodiagnostic examination to assess for potential depression, anxiety, or other mental illness in the setting of right ICH.    Mr. Ernest [name] was originally supposed to be seen for neuropsychological testing as well, but was unable to be roused enough to participate in any formal neuropsychological evaluation.  His wife was present for the session and even with both of us trying to rouse him, he was too fatigued to do anything except mutter a few utterances in response to my questions.  He admitted to being concerned about how his wife and children feel that he is progressing during his rehabilitation.  He noted that historically, he has "bottled up" his emotions, which has not worked well, but he said that he continues to do that.  His wife, however, remarked that he has been very vocal about feeling depressed and in speaking with her and her children about his concerns.  She stated that she was concerned about his level of fatigue as it seemed to have dramatically increased over the past day and a half.  (A later review of medications with his PA revealed that he had been discontinued from oxycodone and trazodone at least two days prior, but continues to take Flexeril and Topamax).  According to his wife, his cognitive functioning seems stable since his first stroke; she commented that it was not unusual for him to forget that he had just had visitors even before the current stroke.  She also said that Mr. Ernest [name] has expressed frustration about inability to see the television due to the positioning of his bed with his right sided neglect.    IMPRESSION:  According to staff members and Mr. Ernest [name]' wife, he has been describing symptoms suggestive of the presence of clinical depression, which represents a change since his stroke.  Therefore, he likely meets  diagnostic criteria for Adjustment Disorder with Depressed Mood.  Psychoeducation was provided to Mr. Ernest [name] and his wife regarding the association between stroke and depression, which his wife noted that she understood (Mr. Ernest [name] was highly fatigued at the time and did not acknowledge much).  Given his wife's report of significant worsening of fatigue over the past couple of days, it is possible that medication side effects are causing his severe fatigue.  His physician may consider whether there is an alternative medication to Topamax that can manage pain, but does not tend to cause cognitive side effects or fatigue.  If he develops improved energy and stamina, a brief neurocognitive evaluation could be conducted prior to his discharge.  Follow up for depression could also be requested from the treatment team, as needed.  If possible, staff members may try repositioning his bed to see if they can find an angle that allows him to see the television.    DIAGNOSIS:   Adjustment disorder with depressed mood  Leavy CellaKaren Kaliegh Willadsen, Psy.D.  Clinical Neuropsychologist

## 2016-05-09 NOTE — Progress Notes (Signed)
   05/09/16 1030  Clinical Encounter Type  Visited With Patient;Health care provider  Visit Type Initial  Spiritual Encounters  Spiritual Needs Prayer;Emotional  Chaplain responded to a consult.  Chaplain visited with nurse and then with patient.  Patient believes God hates him and God is going to kill him.  Chaplain provided emotional and spiritual support to patient.  Chaplain prayed with patient.  Chaplain will follow up on afternoon rounds.

## 2016-05-09 NOTE — Progress Notes (Addendum)
   05/09/16 1000  Clinical Encounter Type  Visited With Patient;Health care provider  Visit Type Initial;Spiritual support  Spiritual Encounters  Spiritual Needs Prayer;Emotional  Note placed in error in Chaplin Clara Smolen's chart. Chaplain Romeo AppleHarrison responded to consult.

## 2016-05-09 NOTE — Progress Notes (Signed)
[place] PHYSICAL MEDICINE & REHABILITATION     PROGRESS NOTE    Subjective/Complaints: No issues overnite   ROS limited by mental status, but appears to deny CP, SOB, N/V/D.  Objective: Vital Signs: Blood pressure (!) 146/82, pulse 84, temperature 98.4 F (36.9 C), temperature source Oral, resp. rate 17, height  (1.753 m), weight 90 kg (198 lb 6.6 oz), SpO2 97 %. No results found.  Recent Labs  05/06/16 1812  WBC 4.5  HGB 12.1*  HCT 36.5*  PLT 181    Recent Labs  05/06/16 1812  NA 134*  K 3.2*  CL 101  GLUCOSE 156*  BUN 20  CREATININE 1.04  CALCIUM 8.7*   CBG (last 3)   Recent Labs  05/08/16 1639 05/08/16 2048 05/09/16 0628  GLUCAP 166* 192* 88    Wt Readings from Last 3 Encounters:  05/09/16 90 kg (198 lb 6.6 oz)  04/26/16 117 kg (258 lb)  08/18/13 89.1 kg (196 lb 6.4 oz)    Physical Exam:  Constitutional: He appears well-developed and well-nourished.  HENT: Normocephalic and atraumatic.  Eyes: Conjunctivae and EOM are normal.   Cardiovascular: Normal rate and regular rhythm.   Respiratory: Effort normal and breath sounds normal. No respiratory distress. He has no wheezes. He has no rales.  GI: Soft. Bowel sounds are normal. He exhibits no distension. There is no tenderness.  Musculoskeletal: He exhibits edema (1+ to 2+ pitting edema LLE--improving).  Neurological: He is alert.  Follows commands. Expressive aphasia Still with poor insight and awareness.    Left inattention Motor: LUE 4+/5 proximal to distal LLE: 4/5 hip flexion, 5/5 knee extension, 5/5 ankle dorsi/plantflexion.   RUE/RLE: 5/5  Skin: Skin is warm and dry. No rash noted. No erythema.  Psychiatric:  Flat, does not make eye contact.   Assessment/Plan: 1. Cognitive, mobility, and visual-spatial deficits secondary to right parietal-temporal ICH which require 3+ hours per day of interdisciplinary therapy in a comprehensive inpatient rehab setting. Physiatrist is  providing close team supervision and 24 hour management of active medical problems listed below. Physiatrist and rehab team continue to assess barriers to discharge/monitor patient progress toward functional and medical goals.  Function:  Bathing Bathing position   Position: Shower  Bathing parts Body parts bathed by patient: Buttocks, Front perineal area Body parts bathed by helper: Buttocks, Right lower leg, Left lower leg, Back  Bathing assist Assist Level: Touching or steadying assistance(Pt > 75%) (only completed perineal hygiene)      Upper Body Dressing/Undressing Upper body dressing   What is the patient wearing?: Pull over shirt/dress     Pull over shirt/dress - Perfomed by patient: Put head through opening, Pull shirt over trunk, Thread/unthread left sleeve Pull over shirt/dress - Perfomed by helper: Thread/unthread right sleeve        Upper body assist Assist Level: Touching or steadying assistance(Pt > 75%)      Lower Body Dressing/Undressing Lower body dressing   What is the patient wearing?: Pants, Non-skid slipper socks, Shoes, [employer]- Performed by patient: Thread/unthread right pants leg, Pull pants up/down Pants- Performed by helper: Thread/unthread left pants leg Non-skid slipper socks- Performed by patient: Don/doff right sock, Don/doff left sock Non-skid slipper socks- Performed by helper: Don/doff right sock, Don/doff left sock       Shoes - Performed by helper: Don/doff right shoe, Don/doff left shoe, Fasten right, Fasten left       TED Hose - Performed by  helper: Don/doff right TED hose, Don/doff left TED hose  Lower body assist Assist for lower body dressing:  (Max assist)      Toileting Toileting Toileting activity did not occur: N/A   Toileting steps completed by helper: Adjust clothing prior to toileting, Performs perineal hygiene, Adjust clothing after toileting Toileting Assistive Devices: Grab bar or rail  Toileting assist  Assist level: Touching or steadying assistance (Pt.75%)   Transfers Chair/bed transfer   Chair/bed transfer method: Stand pivot Chair/bed transfer assist level: Touching or steadying assistance (Pt > 75%) Chair/bed transfer assistive device: Patent attorneyWalker     Locomotion Ambulation     Max distance: 13125ft Assist level: Moderate assist (Pt 50 - 74%)   Wheelchair   Type: Manual Max wheelchair distance: 50 Assist Level: Dependent (Pt equals 0%)  Cognition Comprehension Comprehension assist level: Understands basic 50 - 74% of the time/ requires cueing 25 - 49% of the time  Expression Expression assist level: Expresses basic 50 - 74% of the time/requires cueing 25 - 49% of the time. Needs to repeat parts of sentences.  Social Interaction Social Interaction assist level: Interacts appropriately 25 - 49% of time - Needs frequent redirection.  Problem Solving Problem solving assist level: Solves basic 25 - 49% of the time - needs direction more than half the time to initiate, plan or complete simple activities  Memory Memory assist level: Recognizes or recalls less than 25% of the time/requires cueing greater than 75% of the time   Medical Problem List and Plan: 1.  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              -continue CIR therapies PT, OT SLP,  -substantial visual-perceptual deficits 2.  DVT Prophylaxis/Anticoagulation: Mechanical: Sequential compression devices, below knee Bilateral lower extremities, lovenox 40 qd 3. Pain Management: tylenol and percocet prn. Limit narcs as possible.  -headaches are persistent but now tolerable  -topamax stopped due to poor tolerance 4. Mood: with improved insight/awareness able to discuss his mood  -Cymbalta 30mg  (appears to have been started on [facility]) increased to 40mg .  -neuropsych  -team to provide emotional support also 5. Neuropsych: This patient  is not capable of making decisions on his own behalf.  -Increase ritalin for arousal and to improve attention  pt already on cymbalta,cymbalta just increased need to wait for effect 6. Skin/Wound Care: local pressure relief, elevate lower ext                       -optimize nutrition 7. Fluids/Electrolytes/Nutrition:   Dysphagia 2 thin  -K+ 3.4 0n 8/1, 3.2 on 8/7  -repleting K+, increase to 40meq tid              -follow I's and O's daily 8. Urine retention: removed foley.                       -improved but incont 9. Fluid overload- lasix 60mg  BID--no LE edema will reduce to 40mg  BID                       -daily weights slightly unreliable, but overall trending down                       -follow I and O's Filed Weights   05/07/16 0500 05/08/16 0500 05/09/16 0536  Weight: 89.7 kg (197 lb 12 oz)  89.7 kg (197 lb 12 oz) 90 kg (198 lb 6.6 oz)  10. DM2              -lantus-- increased to 10u bid (on 15u at home)  -sugars improved although po intake is inconsistent  -SSI   CBG (last 3)   Recent Labs  05/08/16 1639 05/08/16 2048 05/09/16 0628  GLUCAP 166* 192* 88    11. HTN:  -continue hydralazine, lasix, catapres, lisinopril Vitals:   05/08/16 1712 05/09/16 0536  BP: (!) 142/88 (!) 146/82  Pulse:  84  Resp:  17  Temp:  98.4 F (36.9 C)     -catapres 0.2mg  tid, increased to 0.3mg  on 8/6   hydralazine increased to 100 on 8/5                       -Lisinopril increased to 40mg  qd on 8/8   -add amlodipine 5mg /day- BP responding 12. Seizure proph: keppra 500mg  bid 13. ABLA--hgb trending up  -10.7 on 8/1   LOS (Days) 13 A FACE TO FACE EVALUATION WAS PERFORMED  Erick Colace 05/09/2016 7:37 AM

## 2016-05-09 NOTE — Progress Notes (Signed)
Social Work Patient ID: Ernest [name], male   DOB: Aug 21, 1956, 60 y.o.   MRN: [id number]   CSW met with pt and his wife to update them on team conference discussion.  Pt's d/c date remains set for 05-17-16, as he is making progress toward his min assist level goals.  Therapy team will want family to come for family education to learn pt's care needs at home.  Wife is agreeable to plan, but would love pt home sooner, if it works out.  CSW will continue to follow.

## 2016-05-09 NOTE — Progress Notes (Signed)
Speech Language Pathology Daily Session Note  Patient Details  Name: Ernest [name] MRN: [id number] Date of Birth: 1956/04/27  Today's Date: 05/09/2016 SLP Individual Time: 0900-1000 SLP Individual Time Calculation (min): 60 min   Short Term Goals: Week 2: SLP Short Term Goal 1 (Week 2): Pt will demonstrate general orientation x 4 with min A. SLP Short Term Goal 2 (Week 2): Pt will gaze shift to midline with max A in 3/4 opportunities.   SLP Short Term Goal 3 (Week 2): Pt will tolerate trials of Dys 3 with adequate oral clearance with min A. SLP Short Term Goal 4 (Week 2): Pt will demonstrate basic problem solving during functional tasks with mod A. SLP Short Term Goal 5 (Week 2): Pt will attend to task for 5 minutes with mod A.   Skilled Therapeutic Interventions:Pt seen for skilled SLP treatment to address cognitive goals. Pt repeatedly said, "God hates me. He is going to kill me" throughout the session. Pt was able to be redirected from this is small increments (2-3 mins), but consistently returned despite max cueing to redirect or change thought. Pt required mod A to approximate midline for intake of liquids. Pt O x self, [place] [facility]Mod A for basic problem solving in a functional task. Pt able to write spouse a note legibly with mod A for pen placement on paper.    Function:  Eating Eating                 Cognition Comprehension Comprehension assist level: Understands basic 75 - 89% of the time/ requires cueing 10 - 24% of the time  Expression   Expression assist level: Expresses basic 75 - 89% of the time/requires cueing 10 - 24% of the time. Needs helper to occlude trach/needs to repeat words.  Social Interaction Social Interaction assist level: Interacts appropriately less than 25% of the time. May be withdrawn or combative.  Problem Solving Problem solving assist level: Solves basic 25 - 49% of the time - needs direction more than half the time to  initiate, plan or complete simple activities  Memory Memory assist level: Recognizes or recalls less than 25% of the time/requires cueing greater than 75% of the time    Pain Pain Assessment Pain Assessment: No/denies pain  Therapy/Group: Individual Therapy  Rocky CraftsKara E Lenore Moyano 05/09/2016, 1:10 PM

## 2016-05-09 NOTE — Progress Notes (Signed)
Occupational Therapy Session Note  Patient Details  Name: Ernest [name] MRN: [id number] Date of Birth: 08-07-1956  Today's Date: 05/09/2016 OT Individual Time: [id number] OT Individual Time Calculation (min): 60 min     Short Term Goals: Week 2:  OT Short Term Goal 1 (Week 2): Pt will complete toilet transfers with min assist and LRAD OT Short Term Goal 2 (Week 2): Pt will visual scan environment to locate 3 items with min verbal prompts OT Short Term Goal 3 (Week 2): Pt will complete LB dressing with mod assist OT Short Term Goal 4 (Week 2): Pt will complete bathing with mod assist and moderate verbal cues for sequencing and completion  Skilled Therapeutic Interventions/Progress Updates:    Treatment session with focus on functional mobility, transfers, visual scanning, and attention to task.  Pt in bed upon arrival willing to participate in shower.  Pt reports needing to toilet first.  Ambulated to room toilet with RW and mod assist and multimodal cues for navigating and steering RW due to Lt inattention.  Increased time and cues for sequencing of transfer on to toilet.  Ambulated without AD from toilet to tub bench in walk-in shower with use of grab bars for upright posture.  Pt demonstrated improved attention to task and sequencing during bathing, still requiring cues for initiation and completion of task.  Pt tearful throughout session asking "am I going to die?" and "why does God hate me?"  Attempted to redirect with discussion of progress towards goals and typical recovery process.  Required increased assist with dressing and self-feeding this session secondary to tearfulness and difficulty with redirecting from above questions.  Notified RN of pt perseveration, mood, and questions.  Left seated upright in w/c with nurse tech present to finish breakfast.  Therapy Documentation Precautions:  Precautions Precautions: Fall Precaution Comments: Swallowing precautions Restrictions Weight  Bearing Restrictions: No General:   Vital Signs: Therapy Vitals Temp: 98.4 F (36.9 C) Temp Source: Oral Pulse Rate: 84 Resp: 17 BP: (!) 146/82 Patient Position (if appropriate): Lying Oxygen Therapy SpO2: 97 % O2 Device: Not Delivered Pain:  Pt with no c/op ain  See Function Navigator for Current Functional Status.   Therapy/Group: Individual Therapy  Rosalio LoudHOXIE, Doris Mcgilvery 05/09/2016, 8:49 AM

## 2016-05-09 NOTE — Progress Notes (Signed)
Physical Therapy Session Note  Patient Details  Name: Ernest [name] MRN: [id number] Date of Birth: Oct 02, 1955  Today's Date: 05/09/2016 PT Individual Time: 1431-1530 PT Individual Time Calculation (min): 59 min    Short Term Goals: Week 1:  PT Short Term Goal 1 (Week 1): Pt will perform gait with mod A 25' in controlled environment PT Short Term Goal 1 - Progress (Week 1): Met PT Short Term Goal 2 (Week 1): pt will perform functional transfers consistently at mod A PT Short Term Goal 2 - Progress (Week 1): Met PT Short Term Goal 3 (Week 1): pt will demo intellectual awareness with mod A PT Short Term Goal 3 - Progress (Week 1): Partly met Week 2:  PT Short Term Goal 1 (Week 2): Pt will perform bed mobility with min A PT Short Term Goal 2 (Week 2): Pt will perform bed <> chair transfers with supervision squat pivot, min A stand pivot with LRAD PT Short Term Goal 3 (Week 2): Pt will perform gait x 100' in controlled environment with LRAD and mod A PT Short Term Goal 4 (Week 2): Pt will negotiate 4 steps with min A  Skilled Therapeutic Interventions/Progress Updates:     Patient received sitting in WC and noted to have increased lability perseverating that "God is going to kill me". PT provided emotional support and attempted to redirect patient into therapy targeted mind set. Mild success with re-direction, but throughout therapy, if patient felt that he was struggling with task, would revert to tearful state, reporting that he "was going to die" and also stating that he was ready to "jump out the window"  When patient was redirectable, PT instructed patient in toilet transfer with mod A with use of rail in bathroom to standard height. Pt required to adjust patient's clothes and performed perineal hygiene.  Gait in bathroom for 66f x 2 with mod A and heavy cues for AD management.   Dynavision training standing with min A from PT for improved stabilty. Patient noted to have significant  difficulty with locating targets in all quardants on this day. Able to only find at most, 2 targets with each trial for 3 rings, all quadrants. PT attempted 2 bouts in bright room and 4 bouts in dark room to increase contrast with only slight improvement in target location in dark room.  Patient instructed in seated reaching task to target as well as color identification. Patient able to locate cups of various colors at midline with max cues for visual scanning. Patient attempted to reach to grap cup from PT, but noted to reach below and to the L for each object. Patient only able to locate object with R or L UE with instruction from PT.   [facility] returned to room and [facility] in place and call bell in reach.   Therapy Documentation Precautions:  Precautions Precautions: Fall Precaution Comments: Swallowing precautions Restrictions Weight Bearing Restrictions: No General:   Vital Signs: Therapy Vitals BP: (!) 151/82 Pain: Pain Assessment Pain Assessment: No/denies pain   See Function Navigator for Current Functional Status.   Therapy/Group: Individual Therapy  ALorie Phenix8/06/2016, 6:28 PM

## 2016-05-09 NOTE — Progress Notes (Signed)
Speech Language Pathology Daily Session Note  Patient Details  Name: Ernest [name]: [id number] Date of Birth: 01-Jul-1956  Today's Date: 05/09/2016 SLP Individual Time: 1312-1330 SLP Individual Time Calculation (min): 18 min   Short Term Goals: Week 2: SLP Short Term Goal 1 (Week 2): Pt will demonstrate general orientation x 4 with min A. SLP Short Term Goal 2 (Week 2): Pt will gaze shift to midline with max A in 3/4 opportunities.   SLP Short Term Goal 3 (Week 2): Pt will tolerate trials of Dys 3 with adequate oral clearance with min A. SLP Short Term Goal 4 (Week 2): Pt will demonstrate basic problem solving during functional tasks with mod A. SLP Short Term Goal 5 (Week 2): Pt will attend to task for 5 minutes with mod A.   Skilled Therapeutic Interventions: Pt was seen for additional make up therapy session targeting cognitive goals.  Pt was asleep in bed upon arrival but awakened easily and immediately began perseverating on not "wanting to die."  Pt repeatedly told therapist, "Tell my wife I love her in case I die."  Pt was transferred to wheelchair from bed and taken outside to attempt to redirect pt with no effect.  Pt required mod assist verbal cues to reorient to place and situation due to perseveration.  Pt was returned to room and left upright in wheelchair with quick release belt in place.  Continue per current plan of care.      Function:  Eating Eating   Modified Consistency Diet: Yes Eating Assist Level: Hand over hand assist   Eating Set Up Assist For: Opening containers;Cutting food Helper Scoops Food on Utensil: Every scoop Helper Brings Food to Mouth: Every scoop   Cognition Comprehension Comprehension assist level: Understands basic 75 - 89% of the time/ requires cueing 10 - 24% of the time  Expression   Expression assist level: Expresses basic 75 - 89% of the time/requires cueing 10 - 24% of the time. Needs helper to occlude trach/needs to repeat words.   Social Interaction Social Interaction assist level: Interacts appropriately less than 25% of the time. May be withdrawn or combative.  Problem Solving Problem solving assist level: Solves basic 25 - 49% of the time - needs direction more than half the time to initiate, plan or complete simple activities  Memory Memory assist level: Recognizes or recalls less than 25% of the time/requires cueing greater than 75% of the time    Pain Pain Assessment Pain Assessment: No/denies pain  Therapy/Group: Individual Therapy  Cindi Ghazarian, Melanee SpryNicole L 05/09/2016, 4:04 PM

## 2016-05-10 ENCOUNTER — Inpatient Hospital Stay (HOSPITAL_COMMUNITY): Payer: Medicare Other | Admitting: Occupational Therapy

## 2016-05-10 ENCOUNTER — Inpatient Hospital Stay (HOSPITAL_COMMUNITY): Payer: Medicare Other | Admitting: Physical Therapy

## 2016-05-10 ENCOUNTER — Inpatient Hospital Stay (HOSPITAL_COMMUNITY): Payer: Medicare Other | Admitting: Speech Pathology

## 2016-05-10 LAB — BASIC METABOLIC PANEL
ANION GAP: 9 (ref 5–15)
BUN: 17 mg/dL (ref 6–20)
CALCIUM: 8.9 mg/dL (ref 8.9–10.3)
CHLORIDE: 102 mmol/L (ref 101–111)
CO2: 25 mmol/L (ref 22–32)
Creatinine, Ser: 1.07 mg/dL (ref 0.61–1.24)
GFR calc non Af Amer: >60 mL/min (ref >60)
GLUCOSE: 127 mg/dL — AB (ref 65–99)
Potassium: 4.2 mmol/L (ref 3.5–5.1)
Sodium: 136 mmol/L (ref 135–145)

## 2016-05-10 LAB — GLUCOSE, CAPILLARY
GLUCOSE-CAPILLARY: 123 mg/dL — AB (ref 65–99)
GLUCOSE-CAPILLARY: 217 mg/dL — AB (ref 65–99)
Glucose-Capillary: 255 mg/dL — ABNORMAL HIGH (ref 65–99)
Glucose-Capillary: 155 mg/dL — ABNORMAL HIGH (ref 65–99)

## 2016-05-10 NOTE — Progress Notes (Signed)
Occupational Therapy Weekly Progress Note  Patient Details  Name: Ernest [name] MRN: [id number] Date of Birth: Dec 22, 1955  Beginning of progress report period: May 03, 2016 End of progress report period: May 10, 2016  Today's Date: 05/10/2016 OT Individual Time: [phone] and 1445-1515 (make up time) OT Individual Time Calculation (min): 56 min and 30 min (make up time)   Patient has met 2 of 4 short term goals.  Pt is making slow progress towards goals.  Pt's progress is being impacted by his decreased attention to task and impaired vision - both visual motor as well as visual attention.  Pt has begun perseverating on depressed feelings and requires emotional support and encouragement to increase participation.  Pt is min-mod assist with mobility, requiring mod-max multimodal cues for sequencing, guiding RW, and visual attention to task.  Has improved to mod assist with bathing and UB dressing and mod-max assist with LB dressing, again requiring mod-max multimodal cues due to attention and visual impairments.    Patient continues to demonstrate the following deficits: impaired cognition, attention, visual attention and scanning, LUE weakness, decreased trunk control, and balance and therefore will continue to benefit from skilled OT intervention to enhance overall performance with BADL and Reduce care partner burden.  Patient progressing toward long term goals..  Continue plan of care.  OT Short Term Goals Week 2:  OT Short Term Goal 1 (Week 2): Pt will complete toilet transfers with min assist and LRAD OT Short Term Goal 1 - Progress (Week 2): Progressing toward goal OT Short Term Goal 2 (Week 2): Pt will visual scan environment to locate 3 items with min verbal prompts OT Short Term Goal 2 - Progress (Week 2): Not met OT Short Term Goal 3 (Week 2): Pt will complete LB dressing with mod assist OT Short Term Goal 3 - Progress (Week 2): Met OT Short Term Goal 4 (Week 2): Pt will  complete bathing with mod assist and moderate verbal cues for sequencing and completion OT Short Term Goal 4 - Progress (Week 2): Met Week 3:  OT Short Term Goal 1 (Week 3): Pt will complete toilet transfers with min assist and LRAD OT Short Term Goal 2 (Week 3): Pt will visual scan environment to locate 3 items at midline with min verbal prompts OT Short Term Goal 3 (Week 3): Pt will complete LB dressing with min assist and min verbal cues for sequencing (except for shoes) OT Short Term Goal 4 (Week 3): Pt will complete bathing with min assist and min verbal cues for sequencing   Skilled Therapeutic Interventions/Progress Updates:    1) Treatment session with focus on sustained attention to task, visual scanning, and improved sequencing with self-care tasks.  Pt received upright eating breakfast with nurse tech.  Therapist assisted pt with self-feeding with focus on scanning to midline to locate food items and grasping cup to bring cup to mouth without assist.  Pt required increased time and cues to scan to midline to locate items each time.  Pt calmer this session with improved participation, despite intermittent questions regarding the whereabouts of his wife and his sons.  Reassured pt that his wife would be up later and everything was okay.  Engaged in dressing with pt demonstrating increased attention to task, however still requiring assist for orientation and setup of shirt to increase success as well as initial threading of one sleeve.  Required assist to orient and setup for pants with pt requiring cues to attempt to visually attend  to task as well.  Donned shoes total assist and ambulated with RW min-mod assist to sink to engage in oral care.  Pt required mod cues and assist to guide RW during ambulation due to decreased attention and vision.  Grooming completed in sitting with setup assist and cues to locate items at midline.  Pt able to spontaneously locate therapist on Lt one time during  session.  Returned to bed at end of session with pt reporting "I can't focus, I need a nap".  Pt appreciative of all assist this session and overall more engaged.  2) Pt seen for make up time.  Treatment session with focus on visual scanning and visual attention.  Pt received upright in w/c agreeable to treatment session.  Pt only asking about his wife and his boys once at beginning of session and then when being returned to room at end of session.  Stand pivot transfers w/c <> therapy mat and then recliner with min assist once directed to visually scan to destination.  Engaged in visual scanning and attention task with large colored dots on table placed in front of pt in Rt visual field up to midline.  Pt required to scan table top to locate all numbers, attempted in sequential order with decreased success but able to locate all dots with increased time and cues to turn head and eyes to Lt.  Increased challenge to two colors and had pt match the dots by color with improved attention to task and ability to complete with mod cues to scan Lt to midline.  Pt tends to undershoot when reaching and then appears to rely on feel to locate items, however demonstrating increased attention and participation.  Left in recliner with soft call bell and quick release belt.  Therapy Documentation Precautions:  Precautions Precautions: Fall Precaution Comments: Swallowing precautions Restrictions Weight Bearing Restrictions: (P) No General:   Vital Signs: Therapy Vitals Temp: 98.5 F (36.9 C) Temp Source: Oral Pulse Rate: 82 Resp: 20 BP: (!) 154/82 Patient Position (if appropriate): Lying Oxygen Therapy SpO2: 96 % O2 Device: Not Delivered Pain:  Pt with no c/o pain  See Function Navigator for Current Functional Status.   Therapy/Group: Individual Therapy  Simonne Come 05/10/2016, 8:13 AM

## 2016-05-10 NOTE — Progress Notes (Signed)
Speech Language Pathology Weekly Progress and Session Note  Patient Details  Name: Ernest [name] MRN: [id number] Date of Birth: 04-Mar-1956  Beginning of progress report period: May 03, 2016  End of progress report period: May 10, 2016  Today's Date: 05/10/2016 SLP Individual Time: 1349-1445 SLP Individual Time Calculation (min): 56 min   Short Term Goals:Week 2: SLP Short Term Goal 1 (Week 2): Pt will demonstrate general orientation x 4 with min A. SLP Short Term Goal 1 - Progress (Week 2): Met SLP Short Term Goal 2 (Week 2): Pt will gaze shift to midline with max A in 3/4 opportunities.   SLP Short Term Goal 2 - Progress (Week 2): Progressing toward goal SLP Short Term Goal 3 (Week 2): Pt will tolerate trials of Dys 3 with adequate oral clearance with min A. SLP Short Term Goal 3 - Progress (Week 2): Met SLP Short Term Goal 4 (Week 2): Pt will demonstrate basic problem solving during functional tasks with mod A. SLP Short Term Goal 4 - Progress (Week 2): Progressing toward goal SLP Short Term Goal 5 (Week 2): Pt will attend to task for 5 minutes with mod A.  SLP Short Term Goal 5 - Progress (Week 2): Progressing toward goal    New Short Term Goals: Week 3: SLP Short Term Goal 1 (Week 3): Pt will demonstrate general orientation x 4 with supervision  SLP Short Term Goal 2 (Week 3): Pt will gaze shift to midline with max A in 3/4 opportunities.   SLP Short Term Goal 3 (Week 3): Pt will tolerate trials of Dys 3 with adequate oral clearance with supervision. SLP Short Term Goal 4 (Week 3): Pt will attend to task for 5 minutes with mod A.   Weekly Progress Updates: Pt has made slow functional gains this reporting period and has met 2 out of 5 short term goals.  Pt has demonstrated improved toleration of trials of dys 3 textures but diet has not been upgraded yet due to cognitive impairment impacting safety with swallowing.  Pt has demonstrated inconsistent ability to attend to tasks  which is largely dependent on the extent of his internal distraction/perseveration.  Pt would continue to benefit from skilled ST while inpatient in order to maximize functional independence and reduce burden of care prior to discharge.  Pt and family education is ongoing.       Intensity: Minumum of 1-2 x/day, 30 to 90 minutes Frequency: 3 to 5 out of 7 days Duration/Length of Stay: 21 days Treatment/Interventions: Cognitive remediation/compensation;Dysphagia/aspiration precaution training;Cueing hierarchy;Environmental controls;Patient/family education;Functional tasks   Daily Session  Skilled Therapeutic Interventions: Pt was seen for skilled ST targeting goals for cognition and dysphagia.  Pt was perseverative on asking where his wife and sons were and required frequent mod assist verbal cues for redirection; however, as session progressed pt was able to recall therapist's answers when asking where his wife was with min assist question cues.   Pt was oriented to place and situation with min assist verbal cues.  Therapist attempted to utilize the Dynavision to address visual scanning and task initiation; however, pt was unable to locate targets without hand over hand assist due to perseveration and impaired visual acuity.   Therapist facilitated the session with a trial snack of dys 3 textures to continue working towards diet progression.  Pt continues to demonstrate impulsivity with rate and portion control, requiring min assist for use of safe swallowing precautions.  No overt s/s of aspiration with solids or liquids  and pt was able to clear solids from the oral cavity without difficulty.  Pt was able to complete oral care and wash his face with min-mod tactile and visual cues for task initiation and location of items.  x1 instance of spontaneous scanning completely to the left noted during task.  Pt was left in wheelchair at the end of today's therapy session with quick release belt donned for  safety.  Goals updated on this date to reflect current progress and plan of care.      Function:   Eating Eating   Modified Consistency Diet: Yes Eating Assist Level: Help managing cup/glass;Help with picking up utensils           Cognition Comprehension Comprehension assist level: Understands basic 75 - 89% of the time/ requires cueing 10 - 24% of the time  Expression   Expression assist level: Expresses basic 75 - 89% of the time/requires cueing 10 - 24% of the time. Needs helper to occlude trach/needs to repeat words.  Social Interaction Social Interaction assist level: Interacts appropriately 25 - 49% of time - Needs frequent redirection.  Problem Solving Problem solving assist level: Solves basic 25 - 49% of the time - needs direction more than half the time to initiate, plan or complete simple activities  Memory Memory assist level: Recognizes or recalls less than 25% of the time/requires cueing greater than 75% of the time   General    Pain Pain Assessment Pain Assessment: No/denies pain  Therapy/Group: Individual Therapy  Shaneika Rossa, Selinda Orion 05/10/2016, 3:57 PM

## 2016-05-10 NOTE — Progress Notes (Signed)
[facility]  PHYSICAL MEDICINE & REHABILITATION     PROGRESS NOTE    Subjective/Complaints: "where's my wife"  ROS limited by mental status, but appears to deny CP, SOB, N/V/D.  Objective: Vital Signs: Blood pressure (!) 154/82, pulse 82, temperature 98.5 F (36.9 C), temperature source Oral, resp. rate 20, height 5\' 9"  (1.753 m), weight 91.7 kg (202 lb 2.6 oz), SpO2 96 %. No results found. No results for input(s): WBC, HGB, HCT, PLT in the last 72 hours. No results for input(s): NA, K, CL, GLUCOSE, BUN, CREATININE, CALCIUM in the last 72 hours.  Invalid input(s): CO CBG (last 3)   Recent Labs  05/09/16 1622 05/09/16 2036 05/10/16 0637  GLUCAP 157* 209* 123*    Wt Readings from Last 3 Encounters:  05/10/16 91.7 kg (202 lb 2.6 oz)  04/26/16 117 kg (258 lb)  08/18/13 89.1 kg (196 lb 6.4 oz)    Physical Exam:  Constitutional: He appears well-developed and well-nourished.  HENT: Normocephalic and atraumatic.  Eyes: Conjunctivae and EOM are normal.   Cardiovascular: Normal rate and regular rhythm.   Respiratory: Effort normal and breath sounds normal. No respiratory distress. He has no wheezes. He has no rales.  GI: Soft. Bowel sounds are normal. He exhibits no distension. There is no tenderness.  Musculoskeletal: He exhibits edema (1+ to 2+ pitting edema LLE--improving).  Neurological: He is alert.  Follows commands. Expressive aphasia Still with poor insight and awareness.    Left inattention Motor: LUE 4+/5 proximal to distal LLE: 4/5 hip flexion, 5/5 knee extension, 5/5 ankle dorsi/plantflexion.   RUE/RLE: 5/5  Skin: Skin is warm and dry. No rash noted. No erythema.  Psychiatric:  Flat, does not make eye contact.   Assessment/Plan: 1. Cognitive, mobility, and visual-spatial deficits secondary to right parietal-temporal ICH which require 3+ hours per day of interdisciplinary therapy in a comprehensive inpatient rehab setting. Physiatrist is providing close  team supervision and 24 hour management of active medical problems listed below. Physiatrist and rehab team continue to assess barriers to discharge/monitor patient progress toward functional and medical goals.  Function:  Bathing Bathing position   Position: Shower  Bathing parts Body parts bathed by patient: Right arm, Left arm, Chest, Abdomen, Front perineal area, Buttocks, Right upper leg, Left upper leg Body parts bathed by helper: Right lower leg, Left lower leg, Back  Bathing assist Assist Level: Touching or steadying assistance(Pt > 75%) (Min assist)      Upper Body Dressing/Undressing Upper body dressing   What is the patient wearing?: Pull over shirt/dress     Pull over shirt/dress - Perfomed by patient: Put head through opening, Pull shirt over trunk Pull over shirt/dress - Perfomed by helper: Thread/unthread left sleeve, Thread/unthread right sleeve        Upper body assist Assist Level:  (Mod assist)      Lower Body Dressing/Undressing Lower body dressing   What is the patient wearing?: Pants, Non-skid slipper socks, Ted Hose     Pants- Performed by patient: Thread/unthread right pants leg, Pull pants up/down Pants- Performed by helper: Thread/unthread left pants leg Non-skid slipper socks- Performed by patient: Don/doff right sock, Don/doff left sock Non-skid slipper socks- Performed by helper: Don/doff right sock, Don/doff left sock       Shoes - Performed by helper: Don/doff right shoe, Don/doff left shoe, Fasten right, Fasten left       TED Hose - Performed by helper: Don/doff right TED hose, Don/doff left TED hose  Lower body  assist Assist for lower body dressing:  (Max assist)      Toileting Toileting Toileting activity did not occur: No continent bowel/bladder event   Toileting steps completed by helper: Adjust clothing prior to toileting, Adjust clothing after toileting Toileting Assistive Devices: Grab bar or rail  Toileting assist Assist  level: Touching or steadying assistance (Pt.75%)   Transfers Chair/bed transfer   Chair/bed transfer method: Stand pivot Chair/bed transfer assist level: Moderate assist (Pt 50 - 74%/lift or lower) Chair/bed transfer assistive device: Patent attorney     Max distance: 128ft Assist level: Moderate assist (Pt 50 - 74%)   Wheelchair   Type: Manual Max wheelchair distance: 50 Assist Level: Dependent (Pt equals 0%)  Cognition Comprehension Comprehension assist level: Understands basic 75 - 89% of the time/ requires cueing 10 - 24% of the time  Expression Expression assist level: Expresses basic 75 - 89% of the time/requires cueing 10 - 24% of the time. Needs helper to occlude trach/needs to repeat words.  Social Interaction Social Interaction assist level: Interacts appropriately less than 25% of the time. May be withdrawn or combative.  Problem Solving Problem solving assist level: Solves basic 25 - 49% of the time - needs direction more than half the time to initiate, plan or complete simple activities  Memory Memory assist level: Recognizes or recalls less than 25% of the time/requires cueing greater than 75% of the time   Medical Problem List and Plan: 1.  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              -continue CIR therapies PT, OT SLP,  -substantial visual-perceptual deficits 2.  DVT Prophylaxis/Anticoagulation: Mechanical: Sequential compression devices, below knee Bilateral lower extremities, lovenox 40 qd 3. Pain Management: tylenol and percocet prn. Limit narcs as possible.  -headaches are persistent but now tolerable  -topamax stopped due to poor tolerance 4. Mood: with improved insight/awareness able to discuss his mood  -Cymbalta  (appears to have been started on [facility]) increased to .  -neuropsych  -team to provide emotional support also 5.  Neuropsych: This patient is not capable of making decisions on his own behalf.  -Increased ritalin 8/9 for arousal and to improve attention  pt already on cymbalta,cymbalta just increased need to wait for effect 6. Skin/Wound Care: local pressure relief, elevate lower ext                       -optimize nutrition 7. Fluids/Electrolytes/Nutrition:   Dysphagia 2 thin  -K+ 3.4 0n 8/1, 3.2 on 8/7, Recheck 8/11  -repleting K+, increase to tid              -follow I's and O's daily 8. Urine retention: removed foley.                       -improved but incont 9. Fluid overload- lasix  BID--no LE edema will reduce to  BID                       -daily weights slightly unreliable, but overall trending down                       -follow I and O's Filed Weights   05/08/16 0500 05/09/16 0536 05/10/16 0543  Weight: 89.7 kg (197 lb 12 oz) 90 kg (198 lb  6.6 oz) 91.7 kg (202 lb 2.6 oz)  10. DM2              -lantus-- increased to 10u bid (on 15u at home)  -sugars improved although po intake is inconsistent  -SSI   CBG (last 3)   Recent Labs  05/09/16 1622 05/09/16 2036 05/10/16 0637  GLUCAP 157* 209* 123*    11. HTN:  -continue hydralazine, lasix, catapres, lisinopril Vitals:   05/09/16 2129 05/10/16 0543  BP: 134/83 (!) 154/82  Pulse:  82  Resp:  20  Temp:  98.5 F (36.9 C)     -catapres 0.2mg  tid, increased to 0.3mg  on 8/6   hydralazine increased to 100 on 8/5                       -Lisinopril increased to 40mg  qd on 8/8   -added amlodipine 5mg /day on 8/9- BP responding 12. Seizure proph: keppra 500mg  bid 13. ABLA--hgb trending up  -10.7 on 8/1   LOS (Days) 14 A FACE TO FACE EVALUATION WAS PERFORMED  Erick Colace 05/10/2016 6:44 AM

## 2016-05-10 NOTE — Plan of Care (Signed)
Problem: RH Grooming Goal: LTG Patient will perform grooming w/assist,cues/equip (OT) LTG: Patient will perform grooming with assist, with/without cues using equipment (OT)  Modified to include cueing as pt will require cues due to visual impairments and decreased attention  Problem: RH Dressing Goal: LTG Patient will perform upper body dressing (OT) LTG Patient will perform upper body dressing with assist, with/without cues (OT).  Modified to include cueing as pt will require cues due to visual impairments and decreased attention

## 2016-05-10 NOTE — Progress Notes (Signed)
Physical Therapy Weekly Progress Note  Patient Details  Name: Ernest [name] MRN: [id number] Date of Birth: 07-Feb-1956  Beginning of progress report period: May 03, 2016 End of progress report period: May 10, 2016  Today's Date: 05/10/2016 PT Individual Time: 9357-0177 PT Individual Time Calculation (min): 75 min    Patient has met 4 of 4 short term goals.  Pt requires minA to min guard overall due to L inattention, strength and endurance deficits. Ambulating up to approximately 100-150' with no AD/RW both with min guard to minA and limited in distance/safety due to attention impairments and fatigue. Continues to be limited by perseveration on being tired, "finding my family", and requires max encouragement/cueing to facilitate participate in sessions.    Patient continues to demonstrate the following deficits:  activity tolerance, balance, postural control, ability to compensate for deficits, attention, awareness and coordination and therefore will continue to benefit from skilled PT intervention to enhance overall performance with bed mobility, transfers, gait, stair management, home and community access.   Patient progressing toward long term goals..  Continue plan of care.  PT Short Term Goals Week 2:  PT Short Term Goal 1 (Week 2): Pt will perform bed mobility with min A PT Short Term Goal 1 - Progress (Week 2): Met PT Short Term Goal 2 (Week 2): Pt will perform bed <> chair transfers with supervision squat pivot, min A stand pivot with LRAD PT Short Term Goal 2 - Progress (Week 2): Met PT Short Term Goal 3 (Week 2): Pt will perform gait x 100' in controlled environment with LRAD and mod A PT Short Term Goal 3 - Progress (Week 2): Met PT Short Term Goal 4 (Week 2): Pt will negotiate 4 steps with min A PT Short Term Goal 4 - Progress (Week 2): Met Week 3:  PT Short Term Goal 1 (Week 3): =LTG due to estimated LOS   Skilled Therapeutic Interventions/Progress Updates:   Pt  received supine in bed. When asked about pain, pt reports "in my heart"; asked pt if he meant emotional pain and pt states "Yes, I need to find my family". Perseverates on finding his wife and children throughout session, unable to recall therapist telling pt wife/children are at work/home within a five minute span. Gait 2x30' with pt reporting "I need to sit, I'm weak". Discussed with pt goal of being able to walk from one room to another in his home at 60-70', and cued pt to ambulate to end of hall as if he was walking to his kitchen from his bedroom; then able to ambulate 104' with minA HHA. Stairs 1x8 on 3" stairs, 2x4 on 6" stairs with min guard and cues for use of LUE for increased L attention and balance. Stand pivot transfer w/c <>flat bed in ADL apartment with min guard. Bed mobility with S and cues for alignment in bed and technique. Couch transfer performed with min guard to couch; min/modA to return to w/c as pt approached w/c and turned the wrong direction to sit in chair, requiring max multimodal cueing to redirect and navigate around w/c wheels to safely sit in w/c. Stand pivot to recliner with min guard. Remained seated in recliner with LEs elevated and quick release belt intact; pt able to demonstrate how to use call bell and all needs in reach.   Therapy Documentation Precautions:  Precautions Precautions: Fall Precaution Comments: Swallowing precautions Restrictions Weight Bearing Restrictions: No Pain: Pain Assessment Pain Assessment: No/denies pain   See Function Navigator for  Current Functional Status.  Therapy/Group: Individual Therapy  Luberta Mutter 05/10/2016, 10:36 AM

## 2016-05-11 ENCOUNTER — Inpatient Hospital Stay (HOSPITAL_COMMUNITY): Payer: Medicare Other | Admitting: Physical Therapy

## 2016-05-11 LAB — GLUCOSE, CAPILLARY
GLUCOSE-CAPILLARY: 176 mg/dL — AB (ref 65–99)
GLUCOSE-CAPILLARY: 114 mg/dL — AB (ref 65–99)
Glucose-Capillary: 144 mg/dL — ABNORMAL HIGH (ref 65–99)
Glucose-Capillary: 230 mg/dL — ABNORMAL HIGH (ref 65–99)

## 2016-05-11 NOTE — Progress Notes (Signed)
Physical Therapy Session Note  Patient Details  Name: Ernest [name] MRN: [id number] Date of Birth: 1956/07/12  Today's Date: 05/11/2016 PT Individual Time: 1345-1415 PT Individual Time Calculation (min): 30 min    Short Term Goals: Week 1:  PT Short Term Goal 1 (Week 1): Pt will perform gait with mod A 25' in controlled environment PT Short Term Goal 1 - Progress (Week 1): Met PT Short Term Goal 2 (Week 1): pt will perform functional transfers consistently at mod A PT Short Term Goal 2 - Progress (Week 1): Met PT Short Term Goal 3 (Week 1): pt will demo intellectual awareness with mod A PT Short Term Goal 3 - Progress (Week 1): Partly met  Skilled Therapeutic Interventions/Progress Updates:    Pt was seen bedside in the pm. Pt required max encouragement to participate. Pt transferred supine to edge of bed with min A and verbal cues. Pt transferred sit to stand throughout treatment with min A and verbal cues. Pt ambulated 12, 140 and 150 feet with hand hold assist and min A with verbal cues. Pt returned to room and left sitting up in w/c with quick release belt in place and call bell within reach.   Therapy Documentation Precautions:  Precautions Precautions: Fall Precaution Comments: Swallowing precautions Restrictions Weight Bearing Restrictions: No General:   Pain: No c/o pain.  See Function Navigator for Current Functional Status.   Therapy/Group: Individual Therapy  Dub Amis 05/11/2016, 3:50 PM

## 2016-05-11 NOTE — Progress Notes (Signed)
At begin of shift patient was at the nsg station in the wheelchair and was very anxious. Attempted to reassure pt and NT assisted patient to bed. Pt attempted to get OOB multiple times, bed alarm was effective and staff presented to room quickly. Pt continuously stating he is "wanting to go home." Discussed with pt reasons why he was admitted to the hospital and highly encouraged pt to continue treatment while on Rehab. After scheduled medications were given, pt settled down and appeared to be asleep around 2315. Repositioned pt and cleansed pt as needed for soiling of brief, bed pad, and/or clothing throughout the shift. Cont with plan of care. Berry Godsey, Phill MutterMelissa Rebecca

## 2016-05-11 NOTE — Progress Notes (Signed)
Patient ID: [name] Zuelke, male   DOB: 1956/08/29, 60 y.o.   MRN: [id number]   05/11/16.    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  Subjective/Complaints:  Sitting in chair;  Little eye contact.  No voice  complaints  ROS limited by mental status, but appears to deny CP, SOB, N/V/D.  Past Medical History:  Diagnosis Date  . CHF (congestive heart failure) (HCC)   . CVA (cerebral infarction)   . Diabetes mellitus without complication (HCC)   . GERD (gastroesophageal reflux disease)   . Hypertension   . OSA (obstructive sleep apnea)   . Seizure disorder (HCC)      Objective: Vital Signs: Blood pressure (!) 144/83, pulse 86, temperature 98.2 F (36.8 C), temperature source Oral, resp. rate 20, height 5\' 9"  (1.753 m), weight 195 lb 8.8 oz (88.7 kg), SpO2 98 %. No results found. No results for input(s): WBC, HGB, HCT, PLT in the last 72 hours.  Recent Labs  05/10/16 0651  NA 136  K 4.2  CL 102  GLUCOSE 127*  BUN 17  CREATININE 1.07  CALCIUM 8.9   CBG (last 3)   Recent Labs  05/10/16 1620 05/10/16 2107 05/11/16 0652  GLUCAP 217* 155* 144*    Wt Readings from Last 3 Encounters:  05/11/16 195 lb 8.8 oz (88.7 kg)  04/26/16 258 lb (117 kg)  08/18/13 196 lb 6.4 oz (89.1 kg)   BP Readings from Last 3 Encounters:  05/11/16 (!) 144/83  08/25/13 (!) 154/89   Physical Exam:  Constitutional: He appears well-developed and well-nourished. Obese HENT: Normocephalic and atraumatic.  Eyes: Conjunctivae and EOM are normal.   Cardiovascular: Normal rate and regular rhythm.   Respiratory: Effort normal and breath sounds normal. No respiratory distress. He has no wheezes. He has no rales.  GI: Soft. Bowel sounds are normal. He exhibits no distension. There is no tenderness.  Musculoskeletal: He  exhibits edema  Neurological: He is alert.   Still with poor insight and awareness.    Left inattention Motor: LUE 4+/5 proximal to distal  Skin: Skin is warm and dry. No rash noted. No erythema.  Psychiatric:  Flat, does not make eye contact.     Medical Problem List and Plan: 1.  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              -continue CIR therapies PT, OT SLP 2.  DVT Prophylaxis/Anticoagulation: Mechanical: Sequential compression devices, below knee Bilateral lower extremities, lovenox 40 qd  3.  Mood: with improved insight/awareness able to discuss his mood  -Cymbalta 30mg  (appears to have been started on [facility]) increased to 40mg .  -neuropsych  -team to provide emotional support also   Filed Weights   05/09/16 0536 05/10/16 0543 05/11/16 0447  Weight: 198 lb 6.6 oz (90 kg) 202 lb 2.6 oz (91.7 kg) 195 lb 8.8 oz (88.7 kg)  4. DM2              -lantus-- increased to 10u bid (on 15u at home)  -sugars improved although po intake is inconsistent  -SSI   CBG (last 3)   Recent Labs  05/10/16 1620 05/10/16 2107 05/11/16 0652  GLUCAP 217* 155* 144*    5. HTN:  -continue hydralazine, lasix, catapres, lisinopril Vitals:   05/11/16 0938 05/11/16 0943  BP: (!) 144/83 Marland Kitchen(!)  144/83  Pulse:    Resp:    Temp:       -catapres 0.2mg  tid, increased to 0.3mg  on 8/6   hydralazine increased to 100 on 8/5                       -Lisinopril increased to  qd on 8/8   -added amlodipine /day on 8/9- BP responding 6.. Seizure proph: keppra  bid 7. ABLA--hgb trending up  -10.7 on 8/1   LOS (Days) 15 A FACE TO FACE EVALUATION WAS PERFORMED  Rogelia Boga 05/11/2016 10:18 AM

## 2016-05-12 ENCOUNTER — Inpatient Hospital Stay (HOSPITAL_COMMUNITY): Payer: Medicare Other | Admitting: Physical Therapy

## 2016-05-12 LAB — GLUCOSE, CAPILLARY
GLUCOSE-CAPILLARY: 98 mg/dL (ref 65–99)
GLUCOSE-CAPILLARY: 135 mg/dL — AB (ref 65–99)
Glucose-Capillary: 107 mg/dL — ABNORMAL HIGH (ref 65–99)
Glucose-Capillary: 225 mg/dL — ABNORMAL HIGH (ref 65–99)

## 2016-05-12 NOTE — Progress Notes (Signed)
Physical Therapy Session Note  Patient Details  Name: Ernest [name] MRN: [id number] Date of Birth: Nov 17, 1955  Today's Date: 05/12/2016 PT Individual Time: 0800-0900 PT Individual Time Calculation (min): 60 min    Short Term Goals: Week 1:  PT Short Term Goal 1 (Week 1): Pt will perform gait with mod A 25' in controlled environment PT Short Term Goal 1 - Progress (Week 1): Met PT Short Term Goal 2 (Week 1): pt will perform functional transfers consistently at mod A PT Short Term Goal 2 - Progress (Week 1): Met PT Short Term Goal 3 (Week 1): pt will demo intellectual awareness with mod A PT Short Term Goal 3 - Progress (Week 1): Partly met  Skilled Therapeutic Interventions/Progress Updates:    Pt was seen bedside in the am. Pt sleeping but arouses to stimuli. Pt transferred supine to edge of bed with head of bed elevated, side rail and S with multiple cues and increased time. Pt transferred sit to stand with min A and verbal cues. Pt ambulated from edge of bed to bathroom about 12 feet with min A and verbal cues. Pt performed toilet transfers with min A. Pt dependent for hygiene. Pt returned to w/c with min A and verbal cues. Pt performed multiple sit to stand transfers throughout treatment session with min A and verbal cues. Pt ambulated 150 and 120 feet with min A and verbal cues. Decreased step length B and decreased cadence. Pt returned to room. As pt entering room in w/c pushed by therapist, bumped R big toe on door frame. No signs of injury, pt c/o pain. Notified pt's nurse. Pt left sitting up in w/c with quick release belt in place and call bell within reach.   Therapy Documentation Precautions:  Precautions Precautions: Fall Precaution Comments: Swallowing precautions Restrictions Weight Bearing Restrictions: No General:   Pain: No c/o pain at rest, c/o pain R LE inconsistently with WB.   See Function Navigator for Current Functional Status.   Therapy/Group: Individual  Therapy  Dub Amis 05/12/2016, 11:39 AM

## 2016-05-12 NOTE — Progress Notes (Signed)
Patient ID: [name] Iman, male   DOB: [birth year], 60 y.o.   MRN: [id number]  05/12/16.     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  Subjective/Complaints:  Lying in bed this am;  More somnolent;  Minimal eye contact with verbal stimuli  ROS limited by mental status, but appears to deny CP, SOB, N/V/D.  Past Medical History:  Diagnosis Date  . CHF (congestive heart failure) (HCC)   . CVA (cerebral infarction)   . Diabetes mellitus without complication (HCC)   . GERD (gastroesophageal reflux disease)   . Hypertension   . OSA (obstructive sleep apnea)   . Seizure disorder (HCC)      Objective: Vital Signs: Blood pressure 140/84, pulse 77, temperature 98.6 F (37 C), temperature source Oral, resp. rate 18, height 5\' 9"  (1.753 m), weight 198 lb 3.1 oz (89.9 kg), SpO2 100 %. No results found. No results for input(s): WBC, HGB, HCT, PLT in the last 72 hours.  Recent Labs  05/10/16 0651  NA 136  K 4.2  CL 102  GLUCOSE 127*  BUN 17  CREATININE 1.07  CALCIUM 8.9   CBG (last 3)   Recent Labs  05/11/16 2059 05/12/16 0639 05/12/16 0647  GLUCAP 114* 98 107*    Wt Readings from Last 3 Encounters:  05/12/16 198 lb 3.1 oz (89.9 kg)  04/26/16 258 lb (117 kg)  08/18/13 196 lb 6.4 oz (89.1 kg)   BP Readings from Last 3 Encounters:  05/12/16 140/84  08/25/13 (!) 154/89   Physical Exam:  Constitutional: He appears well-developed and well-nourished. Obese HENT: Normocephalic and atraumatic.  Eyes: Conjunctivae and EOM are normal.   Cardiovascular: Normal rate and regular rhythm.   Respiratory: Effort normal and breath sounds normal. No respiratory distress. He has no wheezes. He has no rales.  GI: Soft. Bowel sounds are normal. He exhibits no distension. There is no tenderness.  Musculoskeletal:  He exhibits edema  Neurological: He is alert.   Still with poor insight and awareness.    Left inattention Motor: LUE 4+/5 proximal to distal  Skin: Skin is warm and dry. No rash noted. No erythema.  Psychiatric:  Flat, does not make eye contact.     Medical Problem List and Plan: 1.  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              -continue CIR therapies PT, OT SLP 2.  DVT Prophylaxis/Anticoagulation: Mechanical: Sequential compression devices, below knee Bilateral lower extremities, lovenox 40 qd  3.  Mood: with improved insight/awareness able to discuss his mood  -Cymbalta 30mg  (appears to have been started on [facility]) increased to 40mg .  -neuropsych  -team to provide emotional support also   Filed Weights   05/10/16 0543 05/11/16 0447 05/12/16 0419  Weight: 202 lb 2.6 oz (91.7 kg) 195 lb 8.8 oz (88.7 kg) 198 lb 3.1 oz (89.9 kg)  4. DM2              -lantus-- increased to 10u bid (on 15u at home)  -sugars improved although po intake is inconsistent  -SSI   CBG (last 3)   Recent Labs  05/11/16 2059 05/12/16 0639 05/12/16 0647  GLUCAP 114* 98 107*    5. HTN:  -continue hydralazine, lasix, catapres, lisinopril Vitals:   05/12/16 0755 05/12/16 0756  BP: 140/84 140/84  Pulse:    Resp:    Temp:       -catapres 0.2mg  tid, increased to 0.3mg  on 8/6   hydralazine increased to 100 on 8/5                       -Lisinopril increased to  qd on 8/8   -added amlodipine /day on 8/9- BP responding 6.. Seizure proph: keppra  bid 7. ABLA--hgb trending up  -10.7 on 8/1   LOS (Days) 16 A FACE TO FACE EVALUATION WAS PERFORMED  Rogelia Boga 05/12/2016 9:10 AM

## 2016-05-13 ENCOUNTER — Inpatient Hospital Stay (HOSPITAL_COMMUNITY): Payer: Medicare Other | Admitting: Occupational Therapy

## 2016-05-13 ENCOUNTER — Inpatient Hospital Stay (HOSPITAL_COMMUNITY): Payer: Medicare Other | Admitting: Physical Therapy

## 2016-05-13 ENCOUNTER — Inpatient Hospital Stay (HOSPITAL_COMMUNITY): Payer: Medicare Other | Admitting: Speech Pathology

## 2016-05-13 DIAGNOSIS — N390 Urinary tract infection, site not specified: Secondary | ICD-10-CM

## 2016-05-13 LAB — GLUCOSE, CAPILLARY
GLUCOSE-CAPILLARY: 130 mg/dL — AB (ref 65–99)
GLUCOSE-CAPILLARY: 230 mg/dL — AB (ref 65–99)
GLUCOSE-CAPILLARY: 156 mg/dL — AB (ref 65–99)
GLUCOSE-CAPILLARY: 198 mg/dL — AB (ref 65–99)

## 2016-05-13 MED ORDER — INSULIN GLARGINE 100 UNIT/ML ~~LOC~~ SOLN
12.0000 [IU] | Freq: Two times a day (BID) | SUBCUTANEOUS | Status: DC
  Administered 2016-05-13 – 2016-05-16 (×4): 12 [IU] via SUBCUTANEOUS
  Filled 2016-05-13 (×7): qty 0.12

## 2016-05-13 NOTE — Progress Notes (Signed)
Physical Therapy Session Note  Patient Details  Name: Ernest [name] Maggard MRN: [id number] Date of Birth: 11-08-1955  Today's Date: 05/13/2016 PT Individual Time: 1330-1430 PT Individual Time Calculation (min): 60 min    Short Term Goals: Week 3:  PT Short Term Goal 1 (Week 3): =LTG due to estimated LOS  Skilled Therapeutic Interventions/Progress Updates:  Pt received in recliner; RN requesting that pt rest in geriatric chair with tray to prevent pt from releasing quick release belt and ambulating without assistance.  Pt denies pain but reports being angry because he is ready to go home and asking where is his wife.  Provided pt with encouragement and reminding him that he has only 4 more days and will be D/C home at end of week after family education completed; pt continued to state, "No, I am going home now!  Where's Trudy?".  Attempted to contact wife by phone but she did not answer.  Pt unwilling to ambulate with therapist; pt did perform stand pivot recliner > geriatric chair > w/c stand pivot with min HHA for therapist to transport pt to gym.  Attempted to engage pt in multiple activities and through conversation to attempt to re-direct pt to practice car, stairs, or ambulation for home and to perform Nustep for strengthening/endurance/coordination but pt adamantly refused each activity and remained in the chair with his arms cross stating, "I am pissed, I want to go home!  Call Damian Leavellrudy, where's Trudy?  I will call a cab, give me my house keys!".  Continued to attempt to re-direct pt by stating that wife was at work and her wishes were for the pt to continue to practice for home until she arrives.  Pt unwilling.  Returned to room and attempted to engage pt in toileting and visual scanning to locate numbers on phone to call his wife but pt unwilling.  Attempted to contact wife again but unable to.  Pt performed stand pivot w/c > bed with min A and sit > supine with min A.  Pt left in bed with all items  within reach.  Wife to be present for family education tomorrow.  Therapy Documentation Precautions:  Precautions Precautions: Fall Precaution Comments: Swallowing precautions Restrictions Weight Bearing Restrictions: No General: PT Amount of Missed Time (min): 15 Minutes PT Missed Treatment Reason: Patient fatigue;Patient unwilling to participate Vital Signs: Therapy Vitals Temp: 97.9 F (36.6 C) Temp Source: Oral Pulse Rate: 93 Resp: 20 BP: 114/61 Patient Position (if appropriate): Sitting Oxygen Therapy SpO2: 98 % O2 Device: Not Delivered Pain:  No c/o pain  See Function Navigator for Current Functional Status.   Therapy/Group: Individual Therapy  Edman CircleHall, Ovetta Bazzano [facility] 05/13/2016, 2:40 PM

## 2016-05-13 NOTE — Progress Notes (Signed)
[facility] PHYSICAL MEDICINE & REHABILITATION     PROGRESS NOTE    Subjective/Complaints: No new issus over night.    ROS limited by mental status, but appears to deny CP, SOB, N/V/D.  Objective: Vital Signs: Blood pressure (!) 150/80, pulse 83, temperature 98.2 F (36.8 C), temperature source Oral, resp. rate 18, height 5\' 9"  (1.753 m), weight 89.5 kg (197 lb 4.8 oz), SpO2 98 %. No results found. No results for input(s): WBC, HGB, HCT, PLT in the last 72 hours. No results for input(s): NA, K, CL, GLUCOSE, BUN, CREATININE, CALCIUM in the last 72 hours.  Invalid input(s): CO CBG (last 3)   Recent Labs  05/12/16 1145 05/12/16 1629 05/13/16 0643  GLUCAP 225* 135* 130*    Wt Readings from Last 3 Encounters:  05/13/16 89.5 kg (197 lb 4.8 oz)  04/26/16 117 kg (258 lb)  08/18/13 89.1 kg (196 lb 6.4 oz)    Physical Exam:  Constitutional: He appears well-developed and well-nourished.  HENT: Normocephalic and atraumatic.  Eyes: Conjunctivae and EOM are normal.   Cardiovascular: Normal rate and regular rhythm.   Respiratory: Effort normal and breath sounds normal. No respiratory distress. He has no wheezes. He has no rales.  GI: Soft. Bowel sounds are normal. He exhibits no distension. There is no tenderness.  Musculoskeletal: He exhibits edema (1+ to 2+ pitting edema LLE--improving).  Neurological: He is alert.  Follows commands. Expressive aphasia Still with poor insight and awareness.    Left inattention Motor: LUE 4+/5 proximal to distal LLE: 4/5 hip flexion, 5/5 knee extension, 5/5 ankle dorsi/plantflexion.   RUE/RLE: 5/5  Skin: Skin is warm and dry. No rash noted. No erythema.  Psychiatric:  Flat, does not make eye contact.   Assessment/Plan: 1. Cognitive, mobility, and visual-spatial deficits secondary to right parietal-temporal ICH which require 3+ hours per day of interdisciplinary therapy in a comprehensive inpatient rehab setting. Physiatrist is providing  close team supervision and 24 hour management of active medical problems listed below. Physiatrist and rehab team continue to assess barriers to discharge/monitor patient progress toward functional and medical goals.  Function:  Bathing Bathing position   Position: Shower  Bathing parts Body parts bathed by patient: Right arm, Left arm, Chest, Abdomen, Front perineal area, Buttocks, Right upper leg, Left upper leg, Right lower leg, Left lower leg Body parts bathed by helper: Back  Bathing assist Assist Level: Touching or steadying assistance(Pt > 75%)      Upper Body Dressing/Undressing Upper body dressing   What is the patient wearing?: Pull over shirt/dress     Pull over shirt/dress - Perfomed by patient: Put head through opening, Pull shirt over trunk, Thread/unthread right sleeve Pull over shirt/dress - Perfomed by helper: Thread/unthread left sleeve        Upper body assist Assist Level: Touching or steadying assistance(Pt > 75%)      Lower Body Dressing/Undressing Lower body dressing   What is the patient wearing?: Pants, Shoes, [employer] Station[employer] Hose     Pants- Performed by patient: Thread/unthread right pants leg, Pull pants up/down Pants- Performed by helper: Thread/unthread left pants leg Non-skid slipper socks- Performed by patient: Don/doff right sock, Don/doff left sock Non-skid slipper socks- Performed by helper: Don/doff right sock, Don/doff left sock     Shoes - Performed by patient: Fasten right, Don/doff right shoe Shoes - Performed by helper: Don/doff right shoe, Don/doff left shoe, Fasten right, Fasten left       TED Hose - Performed by helper:  Don/doff right TED hose, Don/doff left TED hose  Lower body assist Assist for lower body dressing:  (Max assist)      Toileting Toileting Toileting activity did not occur: No continent bowel/bladder event   Toileting steps completed by helper: Adjust clothing prior to toileting, Adjust clothing after toileting Toileting  Assistive Devices: Grab bar or rail  Toileting assist Assist level: Touching or steadying assistance (Pt.75%)   Transfers Chair/bed transfer   Chair/bed transfer method: Stand pivot Chair/bed transfer assist level: Touching or steadying assistance (Pt > 75%) Chair/bed transfer assistive device: Armrests, Patent attorneyWalker     Locomotion Ambulation     Max distance: 150 Assist level: Touching or steadying assistance (Pt > 75%)   Wheelchair   Type: Manual Max wheelchair distance: 50 Assist Level: Dependent (Pt equals 0%)  Cognition Comprehension Comprehension assist level: Understands basic 90% of the time/cues < 10% of the time  Expression Expression assist level: Expresses basic 90% of the time/requires cueing < 10% of the time.  Social Interaction Social Interaction assist level: Interacts appropriately 25 - 49% of time - Needs frequent redirection.  Problem Solving Problem solving assist level: Solves basic 25 - 49% of the time - needs direction more than half the time to initiate, plan or complete simple activities  Memory Memory assist level: Recognizes or recalls less than 25% of the time/requires cueing greater than 75% of the time   Medical Problem List and Plan: 1.  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              -continue CIR therapies PT, OT SLP,  -substantial visual-perceptual deficits 2.  DVT Prophylaxis/Anticoagulation: Mechanical: Sequential compression devices, below knee Bilateral lower extremities, lovenox 40 qd 3. Pain Management: tylenol and percocet prn. Limit narcs as possible.  -headaches are persistent but now tolerable  -topamax stopped due to poor tolerance 4. Mood: with improved insight/awareness able to discuss his mood  -Cymbalta 30mg  (appears to have been started on [facility]) increased to 40mg .  -neuropsych  -team providing emotional support also 5. Neuropsych:  This patient is not capable of making decisions on his own behalf.  -Increased ritalin 8/9 for arousal and to improve attention  pt already on cymbalta,cymbalta just increased need to wait for effect 6. Skin/Wound Care: local pressure relief, elevate lower ext                       -optimize nutrition 7. Fluids/Electrolytes/Nutrition:   Dysphagia 2 thin  -K+ 4.2 on 8/11  -repleting K+, increasedto 40meq tid              -follow I's and O's daily  -recheck tomorrow 8. Urine retention: removed foley.                       -improved but incont 9. Fluid overload- lasix 60mg  BID--no LE edema will reduce to 40mg  BID                       -daily weights slightly unreliable, but overall trending down                       -follow I and O's Filed Weights   05/11/16 0447 05/12/16 0419 05/13/16 0832  Weight: 88.7 kg (195 lb 8.8 oz) 89.9 kg (198 lb 3.1 oz) 89.5 kg (197 lb 4.8 oz)  10.  DM2              -lantus-- increased to 12u bid (on 15u at home)  -sugars improved but po intake is inconsistent  -SSI   CBG (last 3)   Recent Labs  05/12/16 1145 05/12/16 1629 05/13/16 0643  GLUCAP 225* 135* 130*    11. HTN:  -continue hydralazine, lasix, catapres, lisinopril Vitals:   05/13/16 0831 05/13/16 0832  BP: (!) 150/80 (!) 150/80  Pulse:    Resp:    Temp:       -catapres 0.2mg  tid, increased to 0.3mg  on 8/6   hydralazine increased to 100 on 8/5                       -Lisinopril increased to 40mg  qd on 8/8   -added amlodipine 5mg /day on 8/9-   - BP improving 12. Seizure proph: keppra 500mg  bid 13. ABLA--hgb trending up  -10.7 on 8/1   LOS (Days) 17 A FACE TO FACE EVALUATION WAS PERFORMED  SWARTZ,ZACHARY T 05/13/2016 9:15 AM

## 2016-05-13 NOTE — Progress Notes (Signed)
Occupational Therapy Session Note  Patient Details  Name: Ernest [name] MRN: [id number] Date of Birth: 01-07-1956  Today's Date: 05/13/2016 OT Individual Time: 4098-11910730-0825 OT Individual Time Calculation (min): 55 min     Short Term Goals: Week 3:  OT Short Term Goal 1 (Week 3): Pt will complete toilet transfers with min assist and LRAD OT Short Term Goal 2 (Week 3): Pt will visual scan environment to locate 3 items at midline with min verbal prompts OT Short Term Goal 3 (Week 3): Pt will complete LB dressing with min assist and min verbal cues for sequencing (except for shoes) OT Short Term Goal 4 (Week 3): Pt will complete bathing with min assist and min verbal cues for sequencing  Skilled Therapeutic Interventions/Progress Updates:    Treatment session with focus on ADL retraining, visual scanning, and sustained attention to task.  Pt received upright in recliner attempting to take off quick release belt, reoriented to situation and fall risk.  Ambulated to room shower with hand held assist and mod cues for visual attention and scanning during ambulation.  Bathing completed at sit > stand level with min-mod cues for safety and sequencing.  Pt with posterior LOB when exiting shower requiring max assist to regain as pt stating "don't let me fall".  Pt attempts to step over shower ledge (despite level entry) due to visual deficits.  Dressing completed with mod cues and mod assist for setup of clothing and orientation due to impaired vision and attention to task.  Engaged in self-feeding with focus on visual scanning to locate cup in Rt visual field and plate at midline.  Pt stabbing with fork and sweeping hand to locate items due to decreased vision and difficulty with scanning and fixation on items.  Left semi-reclined in recliner with quick release belt donned and under blanket.  RN notified of position.  Therapy Documentation Precautions:  Precautions Precautions: Fall Precaution Comments:  Swallowing precautions Restrictions Weight Bearing Restrictions: No General:   Vital Signs: Therapy Vitals Temp: 98.2 F (36.8 C) Temp Source: Oral Pulse Rate: 83 Resp: 18 BP: (!) 150/80 Patient Position (if appropriate): Lying Oxygen Therapy SpO2: 98 % O2 Device: Not Delivered Pain:  Pt with no c/o pain  See Function Navigator for Current Functional Status.   Therapy/Group: Individual Therapy  Rosalio LoudHOXIE, Omari Mcmanaway 05/13/2016, 8:41 AM

## 2016-05-13 NOTE — Progress Notes (Addendum)
Speech Language Pathology Daily Session Note  Patient Details  Name: Ernest [name] MRN: [id number] Date of Birth: 1956/09/06  Today's Date: 05/13/2016 SLP Individual Time: [id number] SLP Individual Time Calculation (min): 55 min   Short Term Goals: Week 3: SLP Short Term Goal 1 (Week 3): Pt will demonstrate general orientation x 4 with supervision  SLP Short Term Goal 2 (Week 3): Pt will gaze shift to midline with max A in 3/4 opportunities.   SLP Short Term Goal 3 (Week 3): Pt will tolerate trials of Dys 3 with adequate oral clearance with supervision. SLP Short Term Goal 4 (Week 3): Pt will attend to task for 5 minutes with mod A.   Skilled Therapeutic Interventions: Pt was seen for skilled ST targeting goals for dysphagia and cognition.  Pt already perseverative on wanting to go home upon therapist's arrival.  Was able to sustain his attention to self feeding presentations of currently prescribed diet from breakfast tray for ~2 minute intervals before requiring verbal cues for redirection.  Max multimodal cues needed for rate and portion control due to impulsivity when consuming solids.  Attempted to engage pt in basic self care task to facilitate redirection from perseveration after meal; however, pt refused stating "I'm getting very angry."  Therapist utilized 5 minute therapeutic rest break to prevent further escalation.  Once therapist returned to room, pt had no recollection of therapist from before break and was initially very pleasant.  Pt was agreeable to participating in a basic laundry folding task targeting sustained attention and visual scanning  with min encouragement.  Pt was able to sustain his attention to task for ~3 minute intervals with mod verbal cues for redirection and demonstrated x3 instances of spontaneous scanning to the left to place laundry items into a bag.   Pt was left in wheelchair with quick release belt donned and call bell within reach.   Continue per current plan  of care.    Function:  Eating Eating   Modified Consistency Diet: Yes Eating Assist Level: Hand over hand assist;Set up assist for   Eating Set Up Assist For: Cutting food;Opening containers Helper Scoops Food on Utensil: Every [employer] Helper Brings Food to Mouth: Occasionally   Cognition Comprehension Comprehension assist level: Understands basic 50 - 74% of the time/ requires cueing 25 - 49% of the time  Expression   Expression assist level: Expresses basic 50 - 74% of the time/requires cueing 25 - 49% of the time. Needs to repeat parts of sentences.  Social Interaction Social Interaction assist level: Interacts appropriately 25 - 49% of time - Needs frequent redirection.  Problem Solving Problem solving assist level: Solves basic 25 - 49% of the time - needs direction more than half the time to initiate, plan or complete simple activities  Memory Memory assist level: Recognizes or recalls less than 25% of the time/requires cueing greater than 75% of the time    Pain Pain Assessment Pain Assessment: No/denies pain  Therapy/Group: Individual Therapy  Shanena Pellegrino, Joni ReiningNicole L 05/13/2016, 10:00 AM

## 2016-05-14 ENCOUNTER — Inpatient Hospital Stay (HOSPITAL_COMMUNITY): Payer: Medicare Other

## 2016-05-14 ENCOUNTER — Encounter (HOSPITAL_COMMUNITY): Payer: Medicare Other | Admitting: Occupational Therapy

## 2016-05-14 ENCOUNTER — Ambulatory Visit (HOSPITAL_COMMUNITY): Payer: Medicare Other | Admitting: Physical Therapy

## 2016-05-14 ENCOUNTER — Inpatient Hospital Stay (HOSPITAL_COMMUNITY): Payer: Medicare Other | Admitting: Speech Pathology

## 2016-05-14 LAB — COMPREHENSIVE METABOLIC PANEL
ALK PHOS: 167 U/L — AB (ref 38–126)
ALT: 42 U/L (ref 17–63)
AST: 54 U/L — ABNORMAL HIGH (ref 15–41)
Albumin: 2.6 g/dL — ABNORMAL LOW (ref 3.5–5.0)
Anion gap: 8 (ref 5–15)
BILIRUBIN TOTAL: 0.6 mg/dL (ref 0.3–1.2)
BUN: 18 mg/dL (ref 6–20)
CALCIUM: 9.1 mg/dL (ref 8.9–10.3)
CO2: 24 mmol/L (ref 22–32)
CREATININE: 1.01 mg/dL (ref 0.61–1.24)
Chloride: 105 mmol/L (ref 101–111)
Glucose, Bld: 141 mg/dL — ABNORMAL HIGH (ref 65–99)
Potassium: 3.7 mmol/L (ref 3.5–5.1)
SODIUM: 137 mmol/L (ref 135–145)
Total Protein: 6.9 g/dL (ref 6.5–8.1)

## 2016-05-14 LAB — URINALYSIS, ROUTINE W REFLEX MICROSCOPIC
BILIRUBIN URINE: NEGATIVE
Glucose, UA: NEGATIVE mg/dL
KETONES UR: NEGATIVE mg/dL
Leukocytes, UA: NEGATIVE
NITRITE: NEGATIVE
PH: 7.5 (ref 5.0–8.0)
Protein, ur: >300 mg/dL — AB
Specific Gravity, Urine: 1.016 (ref 1.005–1.030)

## 2016-05-14 LAB — CBC WITH DIFFERENTIAL/PLATELET
Basophils Absolute: 0 10*3/uL (ref 0.0–0.1)
Basophils Relative: 1 %
Eosinophils Absolute: 0.1 10*3/uL (ref 0.0–0.7)
Eosinophils Relative: 2 %
HEMATOCRIT: 36.5 % — AB (ref 39.0–52.0)
HEMOGLOBIN: 11.9 g/dL — AB (ref 13.0–17.0)
LYMPHS ABS: 1.7 10*3/uL (ref 0.7–4.0)
LYMPHS PCT: 43 %
MCH: 29.1 pg (ref 26.0–34.0)
MCHC: 32.6 g/dL (ref 30.0–36.0)
MCV: 89.2 fL (ref 78.0–100.0)
Monocytes Absolute: 0.4 10*3/uL (ref 0.1–1.0)
Monocytes Relative: 10 %
NEUTROS PCT: 44 %
Neutro Abs: 1.7 10*3/uL (ref 1.7–7.7)
Platelets: 153 10*3/uL (ref 150–400)
RBC: 4.09 MIL/uL — AB (ref 4.22–5.81)
RDW: 12.2 % (ref 11.5–15.5)
WBC: 4 10*3/uL (ref 4.0–10.5)

## 2016-05-14 LAB — AMMONIA: Ammonia: 40 umol/L — ABNORMAL HIGH (ref 9–35)

## 2016-05-14 LAB — URINE MICROSCOPIC-ADD ON: Bacteria, UA: NONE SEEN

## 2016-05-14 LAB — GLUCOSE, CAPILLARY: Glucose-Capillary: 133 mg/dL — ABNORMAL HIGH (ref 65–99)

## 2016-05-14 MED ORDER — HALOPERIDOL LACTATE 5 MG/ML IJ SOLN
1.0000 mg | Freq: Four times a day (QID) | INTRAMUSCULAR | Status: DC | PRN
  Administered 2016-05-14 (×2): 1 mg via INTRAMUSCULAR
  Filled 2016-05-14 (×3): qty 1

## 2016-05-14 MED ORDER — CIPROFLOXACIN 500 MG/5ML (10%) PO SUSR: 250.0000 mg | Freq: Two times a day (BID) | ORAL | Status: DC

## 2016-05-14 MED ORDER — CEFTRIAXONE SODIUM 1 G IJ SOLR
1.0000 g | Freq: Once | INTRAMUSCULAR | Status: AC
  Administered 2016-05-14: 1 g via INTRAMUSCULAR
  Filled 2016-05-14: qty 10

## 2016-05-14 MED ORDER — CIPROFLOXACIN HCL 250 MG PO TABS: 250.0000 mg | ORAL_TABLET | Freq: Two times a day (BID) | ORAL | Status: DC

## 2016-05-14 MED ORDER — HALOPERIDOL LACTATE 2 MG/ML PO CONC
1.0000 mg | ORAL | Status: AC
  Administered 2016-05-14: 1 mg via ORAL
  Filled 2016-05-14: qty 0.5

## 2016-05-14 MED ORDER — HALOPERIDOL LACTATE 5 MG/ML IJ SOLN: 1.0000 mg | Freq: Four times a day (QID) | INTRAMUSCULAR | Status: DC | PRN

## 2016-05-14 MED ORDER — HALOPERIDOL 1 MG PO TABS
1.0000 mg | ORAL_TABLET | Freq: Four times a day (QID) | ORAL | Status: DC | PRN
  Filled 2016-05-14: qty 1

## 2016-05-14 MED ORDER — CIPROFLOXACIN HCL 250 MG PO TABS: 250.0000 mg | ORAL_TABLET | Freq: Once | ORAL | Status: DC

## 2016-05-14 MED ORDER — HYDRALAZINE HCL 20 MG/ML IJ SOLN
2.0000 mg | Freq: Four times a day (QID) | INTRAMUSCULAR | Status: DC | PRN
  Filled 2016-05-14: qty 0.1

## 2016-05-14 MED ORDER — METHYLPHENIDATE HCL 5 MG PO TABS: 10.0000 mg | ORAL_TABLET | Freq: Every day | ORAL | Status: DC

## 2016-05-14 MED ORDER — LORAZEPAM 2 MG/ML IJ SOLN
INTRAMUSCULAR | Status: AC
  Filled 2016-05-14: qty 1

## 2016-05-14 MED ORDER — LORAZEPAM 2 MG/ML IJ SOLN
0.5000 mg | Freq: Once | INTRAMUSCULAR | Status: AC
  Administered 2016-05-14: 0.5 mg via INTRAMUSCULAR
  Filled 2016-05-14: qty 1

## 2016-05-14 MED ORDER — SODIUM CHLORIDE 0.9% FLUSH: 3.0000 mL | Freq: Two times a day (BID) | INTRAVENOUS | Status: DC

## 2016-05-14 MED ORDER — SODIUM CHLORIDE 0.9% FLUSH: 3.0000 mL | INTRAVENOUS | Status: DC | PRN

## 2016-05-14 MED ORDER — LORAZEPAM 2 MG/ML IJ SOLN
0.5000 mg | INTRAMUSCULAR | Status: AC
  Administered 2016-05-14: 1 mg via INTRAMUSCULAR

## 2016-05-14 MED ORDER — HALOPERIDOL LACTATE 5 MG/ML IJ SOLN: 1.0000 mg | INTRAMUSCULAR | Status: AC

## 2016-05-14 MED ORDER — SODIUM CHLORIDE 0.9 % IV SOLN: 250.0000 mL | INTRAVENOUS | Status: DC | PRN

## 2016-05-14 MED ORDER — LORAZEPAM 0.5 MG PO TABS
0.5000 mg | ORAL_TABLET | Freq: Once | ORAL | Status: AC
  Filled 2016-05-14: qty 1

## 2016-05-14 MED ORDER — HALOPERIDOL LACTATE 5 MG/ML IJ SOLN
2.0000 mg | Freq: Four times a day (QID) | INTRAMUSCULAR | Status: DC | PRN
  Administered 2016-05-14: 2 mg via INTRAMUSCULAR
  Filled 2016-05-14: qty 1

## 2016-05-14 MED ORDER — LIDOCAINE HCL (PF) 1 % IJ SOLN
2.1000 mL | Freq: Once | INTRAMUSCULAR | Status: DC
  Filled 2016-05-14: qty 4
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
## 2016-05-14 NOTE — Progress Notes (Signed)
Occupational Therapy Note  Patient Details  Name: Ernest EagleWillie [name] MRN: [id number] Date of Birth: June 03, 1956  Attempted to see pt at 13:30, however, nursing advised not to see pt due to agitation. Will attempt to see pt when pt appropriate to cont with POC.   Lewis, Niasha Devins C 05/14/2016, 1:28 PM

## 2016-05-14 NOTE — Progress Notes (Signed)
Nutrition Brief Note  RD consulted for diet education with request to education wife on a carbohydrate modified/low sodium/heart healthy diet. Spoke with RN, pt agitated and advised not to visit patient today. Family has left earlier in the day as the stimulation may have exacerbated the agitation/symptoms. RD to visit tomorrow to educate wife on diet as appropriate.  Roslyn SmilingStephanie Tavonte Seybold, MS, RD, LDN Pager # [phone] After hours/ weekend pager # [phone]

## 2016-05-14 NOTE — Progress Notes (Signed)
[facility]IC[facility]ABILITATION     PROGRESS NOTE    Subjective/Complaints: Confusion and agitation reported overnight. Very upset asking why he was here this morning. Wants to go home. "where's my wife?".     ROS limited by mental status, but appears to deny CP, SOB, N/V/D.  Objective: Vital Signs: Blood pressure (!) 185/98, pulse 90, temperature 97.7 F (36.5 C), temperature source Oral, resp. rate (!) 22, height 5\' 9"  (1.753 m), weight 89.5 kg (197 lb 5 oz), SpO2 100 %. No results found. No results for input(s): WBC, HGB, HCT, PLT in the last 72 hours. No results for input(s): NA, K, CL, GLUCOSE, BUN, CREATININE, CALCIUM in the last 72 hours.  Invalid input(s): CO CBG (last 3)   Recent Labs  05/13/16 1100 05/13/16 1631 05/13/16 2131  GLUCAP 230* 156* 198*    Wt Readings from Last 3 Encounters:  05/14/16 89.5 kg (197 lb 5 oz)  04/26/16 117 kg (258 lb)  08/18/13 89.1 kg (196 lb 6.4 oz)    Physical Exam:  Constitutional: He appears well-developed and well-nourished.  HENT: Normocephalic and atraumatic.  Eyes: Conjunctivae and EOM are normal.   Cardiovascular: Normal rate and regular rhythm.   Respiratory: Effort normal and breath sounds normal. No respiratory distress. He has no wheezes. He has no rales.  GI: Soft. Bowel sounds are normal. He exhibits no distension. There is no tenderness.  Musculoskeletal: He exhibits edema (1+ to 2+ pitting edema LLE--improving).  Neurological: He is alert.  Follows commands.  Motor: LUE 4+/5 proximal to distal LLE: 4/5 hip flexion, 5/5 knee extension, 5/5 ankle dorsi/plantflexion.   RUE/RLE: 5/5  Skin: Skin is warm and dry. No rash noted. No erythema.  Psychiatric:  Irritable, confused. Doesn't recall me, doesn't remember who dr. Wynn Bankerkirsteins is. Doesn't know why he's here or how long he's been in the hospital.   Assessment/Plan: 1. Cognitive, mobility, and visual-spatial deficits secondary to right  parietal-temporal ICH which require 3+ hours per day of interdisciplinary therapy in a comprehensive inpatient rehab setting. Physiatrist is providing close team supervision and 24 hour management of active medical problems listed below. Physiatrist and rehab team continue to assess barriers to discharge/monitor patient progress toward functional and medical goals.  Function:  Bathing Bathing position   Position: Shower  Bathing parts Body parts bathed by patient: Right arm, Left arm, Chest, Abdomen, Front perineal area, Buttocks, Right upper leg, Left upper leg, Right lower leg, Left lower leg Body parts bathed by helper: Back  Bathing assist Assist Level: Touching or steadying assistance(Pt > 75%)      Upper Body Dressing/Undressing Upper body dressing   What is the patient wearing?: Pull over shirt/dress     Pull over shirt/dress - Perfomed by patient: Put head through opening, Pull shirt over trunk, Thread/unthread right sleeve Pull over shirt/dress - Perfomed by helper: Thread/unthread left sleeve        Upper body assist Assist Level: Touching or steadying assistance(Pt > 75%)      Lower Body Dressing/Undressing Lower body dressing   What is the patient wearing?: Pants, Shoes, [employer] Station[employer] Hose     Pants- Performed by patient: Thread/unthread right pants leg, Pull pants up/down Pants- Performed by helper: Thread/unthread left pants leg Non-skid slipper socks- Performed by patient: Don/doff right sock, Don/doff left sock Non-skid slipper socks- Performed by helper: Don/doff right sock, Don/doff left sock     Shoes - Performed by patient: Fasten right, Don/doff right shoe Shoes - Performed  by helper: Don/doff right shoe, Don/doff left shoe, Fasten right, Fasten left       TED Hose - Performed by helper: Don/doff right TED hose, Don/doff left TED hose  Lower body assist Assist for lower body dressing:  (Max assist)      Toileting Toileting Toileting activity did not occur:  No continent bowel/bladder event   Toileting steps completed by helper: Adjust clothing prior to toileting, Adjust clothing after toileting Toileting Assistive Devices: Grab bar or rail  Toileting assist Assist level: Touching or steadying assistance (Pt.75%)   Transfers Chair/bed transfer   Chair/bed transfer method: Stand pivot Chair/bed transfer assist level: Touching or steadying assistance (Pt > 75%) Chair/bed transfer assistive device: Armrests, Bedrails     Locomotion Ambulation     Max distance: 150 Assist level: Touching or steadying assistance (Pt > 75%)   Wheelchair   Type: Manual Max wheelchair distance: 50 Assist Level: Dependent (Pt equals 0%)  Cognition Comprehension Comprehension assist level: Understands basic 50 - 74% of the time/ requires cueing 25 - 49% of the time  Expression Expression assist level: Expresses basic 75 - 89% of the time/requires cueing 10 - 24% of the time. Needs helper to occlude trach/needs to repeat words.  Social Interaction Social Interaction assist level: Interacts appropriately 25 - 49% of time - Needs frequent redirection.  Problem Solving Problem solving assist level: Solves basic 25 - 49% of the time - needs direction more than half the time to initiate, plan or complete simple activities  Memory Memory assist level: Recognizes or recalls less than 25% of the time/requires cueing greater than 75% of the time   Medical Problem List and Plan: 1.  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              -continue CIR therapies PT, OT SLP,  - 2.  DVT Prophylaxis/Anticoagulation: Mechanical: Sequential compression devices, below knee Bilateral lower extremities, lovenox 40 qd 3. Pain Management: tylenol and percocet prn. Limit narcs as possible.  -headaches are improved  -topamax stopped due to poor tolerance 4. Mood: had been displaying improved  insight/awareness able to discuss his mood  -Cymbalta 30mg  (appears to have been started on [facility]) increased to 40mg .  -Acute change in mental status today---check urine/labs    -no medication changes have been made  -observe for progression 5. Neuropsych: This patient is not capable of making decisions on his own behalf.  -Increased ritalin to 10mg  8/9 for arousal and to improve attention  pt already on cymbalta,cymbalta just increased need to wait for effect 6. Skin/Wound Care: local pressure relief, elevate lower ext                       -optimize nutrition 7. Fluids/Electrolytes/Nutrition:   Dysphagia 2 thin  -K+ 4.2 on 8/11  -repleting K+, increasedto 40meq tid              -follow I's and O's daily    8. Urine retention: removed foley.                       -improved but incont 9. Fluid overload- lasix 60mg  BID--no LE edema will reduce to 40mg  BID                       -daily weights slightly unreliable, but overall trending down                       -  follow I and O's Filed Weights   05/12/16 0419 05/13/16 0832 05/14/16 0617  Weight: 89.9 kg (198 lb 3.1 oz) 89.5 kg (197 lb 4.8 oz) 89.5 kg (197 lb 5 oz)  10. DM2              -lantus-- increased to 12u bid (on 15u at home)  -continue to titrate to effect  -SSI   CBG (last 3)   Recent Labs  05/13/16 1100 05/13/16 1631 05/13/16 2131  GLUCAP 230* 156* 198*    11. HTN:  -continue hydralazine, lasix, catapres, lisinopril Vitals:   05/13/16 1318 05/14/16 0617  BP: 114/61 (!) 185/98  Pulse: 93 90  Resp: 20 (!) 22  Temp: 97.9 F (36.6 C) 97.7 F (36.5 C)     -catapres 0.2mg  tid, increased to 0.3mg  on 8/6   hydralazine increased to 100 on 8/5                       -Lisinopril increased to 40mg  qd on 8/8   -added amlodipine 5mg /day on 8/9-   - BP improving 12. Seizure proph: keppra 500mg  bid 13. ABLA--hgb trending up  -10.7 on 8/1   LOS (Days) 18 A FACE TO FACE EVALUATION WAS PERFORMED  SWARTZ,ZACHARY  T 05/14/2016 9:07 AM

## 2016-05-14 NOTE — Progress Notes (Signed)
Patient remains verbally abusive & threatening to staff. He is in an enclosure bed. No signs of injury noted. He refused medications this evening. Asked patient if he would like to eat, he replied "yes & I want the knife & fork" in a very threatening way, while sitting up in bed. Seems in no distress. Staff went in to toilet patient & patient was swinging at staff, cursing, threatening staff to punch them in the mouth. IM Haldol was given as ordered. Wife Damian Leavellrudy called just prior to check on status & was informed that patient was calling out & still very agitated & becoming increasingly combative & aggressive. Did inform her of intervention available & possible usage. She verbalized understanding. Will continue to monitor

## 2016-05-14 NOTE — Progress Notes (Signed)
At this time, patient is still in the same state. Patient tried to get out of bed, when nurse approached & stood by him, he replied that he would knock the nurses teeth out for keeping him against his will. Tried to call his wife again for him to speak to, no answer at both numbers. Patient is argumentative & keeps asking the same questions over & over, but gets angry at the answers. Stated he wanted to get dressed, but when the charge nurse tried to assist him, he declined. He asked for the pants & was given them. He then stated he did not want them on & put them at the side of the bed. Tried diversion multiple times, tried to reorient multiple times, states he does not have to toilet & is not in pain. He just wanted to "get the hell out of here". Tried to explain that staff were trying to keep him safe & prevent a fall & possible head injury, that his wife & physicians would be in shortly. Patient stated that he was going to sue because he didn't sign any papers & that he was going to break some windows.

## 2016-05-14 NOTE — Progress Notes (Signed)
Patient continues to be agitated, verbally and physically abusive despite families attempt to deescalate.  Family asked to go home at this time as the stimulation could be making symptoms worse.  Patient has received IM haldol twice and IM ativan twice, both with no significant change in patient behavior.  Marissa NestlePam Love, PA aware.  Will await further orders.  Dani Gobbleeardon, Waylen Depaolo J, RN

## 2016-05-14 NOTE — Plan of Care (Signed)
Problem: RH SAFETY Goal: RH STG ADHERE TO SAFETY PRECAUTIONS W/ASSISTANCE/DEVICE STG Adhere to Safety Precautions With Min Assistance/Device.   Outcome: Not Progressing Patient consistently getting up unassisted. Restraints initiated.

## 2016-05-14 NOTE — Progress Notes (Addendum)
Patient placed in enclosure bed for safety purposes.  Security present to assist nursing staff in transferring patient into new bed.  Patient still combative, swinging at staff and verbally threatening staff.  Damian Leavellrudy, patients spouse, informed of new developments.  Dani Gobbleeardon, Altus Zaino J, RN

## 2016-05-14 NOTE — Progress Notes (Signed)
Patient increasingly agitated.  Patient becoming verbally and physically abusive.  Calling out for help and unresponsive to attempts to redirect.  Orders received for ativan 0.5mg  IM now.  Administered medication per orders with no obvious reaction.  Ordered by Marissa NestlePam Love, PA to repeat dose.  Came back with syringe and Pam verbally ordered me to give 1mg  instead of 0.5mg .  Administered ativan IM with assistance from family, security, and nursing staff to hold patient still.  Ordered to place patient in waist belt for patient safety since patient continues to try to get out of bed.  Dani Gobbleeardon, Kaylynn Chamblin J, RN

## 2016-05-14 NOTE — Progress Notes (Signed)
Occupational Therapy Note  Patient Details  Name: Ernest [name] MRN: [id number] Date of Birth: 1955/10/21  Pt missed 60 mins OT session secondary to agitation and recommendation to not attempt to see pt from nursing.     Tamme Mozingo OTR/L 05/14/2016, 12:20 PM

## 2016-05-14 NOTE — Progress Notes (Signed)
At approximately 0030, patients wife called asking how patient was & was told that he was asking for her. Wife stated that if he became agitated that she would come in.  At approximately 0310, patient woke up & requested to go to the restroom. After he was taken, he started to ask why he was here in the facility. He was disoriented & stated that he did not know where he was. When he was told where he was, patient started to curse & repeatedly ask for his wife, saying that he was going home. Patient threatened that staff wouid have to call security because he was about to "tear shit up". His wife was called numerous times at home & cell phone numbers. No answer. There was not a voicemail set up. Patient tried to get out of bed, asked for his clothes, asked what happened to him, asked who brought him here, how he got here, who admitted him, etc repeatedly for over a hour. Patient threatened to throw the computer throught the window multiple times if his wife didn't arrive & if nurse could not get her on the phone. Nurse had to stay in the room with patient to try to keep him from getting up & trying to leave. He asked where his wallet was & became angry when he was answered that his wife had it. He asked for his clothes & shoes multiple times. At this time, wife still cannot be reached.

## 2016-05-14 NOTE — Progress Notes (Signed)
Pt with continued confusion and agitation today. CT of head essentially unremarkable/improved. Lab work reveals slight elevation in ammonia level. UA still "active"--no results. Will add empiric cipro with urine pending. UCX in process.  Enclosure bed for safety. Prn haldol if needed for severe agitation. Hold ritalin for now. Limit neurosedating meds as possible. BP control  Ranelle OysterZachary T. Lilyona Richner, MD, [facility] CenterFAAPMR [facility] 05/14/2016

## 2016-05-14 NOTE — Progress Notes (Signed)
Wife updated on results of work up as well as need for Enclosure bed for safety.

## 2016-05-14 NOTE — Progress Notes (Signed)
Tried to call patients wife again at [phone], no answer. Patient tried to get out of bed. Staff diverted him to the restroom after he insisted on getting up & stood up. He continues with the same repetitious questions, answering with " I ain't had no damn stroke". He was assisted back to bed where vitals were taken. He refused his CBGs. Lab tech came to draw his labs & he was verbally abusive & refused. Will continue to monitor & communicate with physician & on coming nurse.

## 2016-05-14 NOTE — Progress Notes (Signed)
Patient responding to agitation medication.  Patient now sleeping.  Attempted to get IV access, patient reacted to IV stick and pulled away.  Called RR nurse for guidance.  Patient calmed back down after a few minutes.  Lab technician arrived to draw blood.  Patient slept through needle stick.  RR nurse arrived, debriefed.  She was able to start IV in left wrist and is accompanying patient to radiology.  Awaiting test results.  Dani Gobbleeardon, Annaelle Kasel J, RN

## 2016-05-14 NOTE — Progress Notes (Signed)
Called by RN to assist with care of patient with new onset confusion and combative state.  On arrival RN reports some of the meds have kicked in and patient resting.  Lab here - labs drawn.  #20 angio cath inserted left wrist x one attempt - to NSL - patient tol well with little movement.  Skin warm and dry.  Oral mucosa dry.  Moves all extremities. Bp 181/95 HR 80 RR 20 O2 sats 98% on RA.   Pam Love PA present at bedside.  Patient transported to CT scan with Anastasio Championaniel Rad Transporter - tol well. On CT table became a little restless when safety straps applied - quiet enough to get CT scan.  In route back to room patient becoming more vocal - cursing and confused.  Back to room - restless - agitated - stood to void - urine sample obtained - threatening staff - needs 2 person assist to stand - back to bed - able to apply safety belt - Pam Love PA to bedside - labs reviewed - CT reviewed - staff speak with Dr. Hermelinda MedicusSchwartz - orders to place in enclosure bed for safety.  Staff to call as needed.

## 2016-05-14 NOTE — Progress Notes (Signed)
Physical Therapy Session Note  Patient Details  Name: Ernest [name] Mersch MRN: [id number] Date of Birth: Sep 24, 1956  Today's Date: 05/14/2016 PT Missed Time: 90 Minutes Missed Time Reason: Increased agitation   Short Term Goals: Week 3:  PT Short Term Goal 1 (Week 3): =LTG due to estimated LOS  Skilled Therapeutic Interventions/Progress Updates:   Pt with increasing agitation and altered mental status beginning last night.  Per RN pt unable to participate in therapy today due to increased agitation and combativeness; medical work up in progress.  Will resume therapy tomorrow if cleared medically to participate.    Therapy Documentation Precautions:  Precautions Precautions: Fall Precaution Comments: Swallowing precautions Restrictions Weight Bearing Restrictions: No General: PT Amount of Missed Time (min): 90 Minutes PT Missed Treatment Reason: Increased agitation Pain: Pain Assessment Pain Assessment: No/denies pain   Edman CircleHall, Audra Faucette 05/14/2016, 1:59 PM

## 2016-05-15 ENCOUNTER — Inpatient Hospital Stay (HOSPITAL_COMMUNITY): Payer: Medicare Other | Admitting: Physical Therapy

## 2016-05-15 ENCOUNTER — Inpatient Hospital Stay (HOSPITAL_COMMUNITY): Payer: Medicare Other | Admitting: Speech Pathology

## 2016-05-15 ENCOUNTER — Inpatient Hospital Stay (HOSPITAL_COMMUNITY): Payer: Medicare Other | Admitting: Occupational Therapy

## 2016-05-15 LAB — URINE CULTURE

## 2016-05-15 LAB — GLUCOSE, CAPILLARY
GLUCOSE-CAPILLARY: 169 mg/dL — AB (ref 65–99)
GLUCOSE-CAPILLARY: 127 mg/dL — AB (ref 65–99)
Glucose-Capillary: 138 mg/dL — ABNORMAL HIGH (ref 65–99)
Glucose-Capillary: 177 mg/dL — ABNORMAL HIGH (ref 65–99)

## 2016-05-15 MED ORDER — DULOXETINE HCL 30 MG PO CPEP
30.0000 mg | ORAL_CAPSULE | Freq: Every day | ORAL | Status: DC
  Administered 2016-05-16: 30 mg via ORAL
  Filled 2016-05-15: qty 1

## 2016-05-15 NOTE — Progress Notes (Addendum)
Speech Language Pathology Daily Session Note  Patient Details  Name: Ernest [name] MRN: [id number] Date of Birth: 1955-12-19  Today's Date: 05/15/2016 SLP Individual Time: 1350-1445 SLP Individual Time Calculation (min): 55 min   Short Term Goals: Week 3: SLP Short Term Goal 1 (Week 3): Pt will demonstrate general orientation x 4 with supervision  SLP Short Term Goal 2 (Week 3): Pt will gaze shift to midline with max A in 3/4 opportunities.   SLP Short Term Goal 3 (Week 3): Pt will tolerate trials of Dys 3 with adequate oral clearance with supervision. SLP Short Term Goal 4 (Week 3): Pt will attend to task for 5 minutes with mod A.   Skilled Therapeutic Interventions: Pt was seen for skilled ST targeting cognitive goals.  Pt no longer perseverative on wanting to go home; however, affect today was very flat and pt passively participated in therapies.  Therapist facilitated the session with a basic block sorting task targeting sustained attention and visual scanning to the left of midline.  Pt was able to sustain his attention to task for ~2 minute intervals with min verbal cues for redirection.  Visual scanning to the left was spontaneous only and pt continued to rely heavily on groping for objects on his left to find them.  Pt required mod assist verbal cues to reorient to place and situation repeatedly throughout session with limited carryover of information noted despite repetition of information.  Pt was returned to room at the end of today's therapy session and left in recliner with call bell within reach and quick release belt donned for safety.  Continue per current plan of care.    Function:  Eating Eating            Cognition Comprehension Comprehension assist level: Understands basic 50 - 74% of the time/ requires cueing 25 - 49% of the time  Expression   Expression assist level: Expresses basic 75 - 89% of the time/requires cueing 10 - 24% of the time. Needs helper to occlude  trach/needs to repeat words.  Social Interaction Social Interaction assist level: Interacts appropriately 50 - 74% of the time - May be physically or verbally inappropriate.  Problem Solving Problem solving assist level: Solves basic 25 - 49% of the time - needs direction more than half the time to initiate, plan or complete simple activities  Memory Memory assist level: Recognizes or recalls less than 25% of the time/requires cueing greater than 75% of the time    Pain Pain Assessment Pain Assessment: 0-10 Pain Score: 6  Pain Location: Head Pain Descriptors / Indicators: Headache Pain Intervention(s): RN made aware  Therapy/Group: Individual Therapy  Wister Hoefle, Melanee SpryNicole L 05/15/2016, 2:47 PM

## 2016-05-15 NOTE — Progress Notes (Signed)
Occupational Therapy Session Note  Patient Details  Name: [name] Crute MRN: [id number] Date of Birth: 12/15/1955  Today's Date: 05/15/2016 OT Individual Time: [id number] OT Individual Time Calculation (min): 45 min     Short Term Goals: Week 3:  OT Short Term Goal 1 (Week 3): Pt will complete toilet transfers with min assist and LRAD OT Short Term Goal 2 (Week 3): Pt will visual scan environment to locate 3 items at midline with min verbal prompts OT Short Term Goal 3 (Week 3): Pt will complete LB dressing with min assist and min verbal cues for sequencing (except for shoes) OT Short Term Goal 4 (Week 3): Pt will complete bathing with min assist and min verbal cues for sequencing  Skilled Therapeutic Interventions/Progress Updates:    Treatment session with focus on participation and self-care retraining.  Pt more agreeable this session and participatory throughout.  Engaged in UB bathing at sink with setup for items with pt requiring min cues for sequencing and thoroughness.  Required pt to scan to midline to locate wash cloth.  Donned shirt with setup assist only this session for orientation of shirt.  Donned pants with assist to start threading of each leg due to visual impairments, with pt able to complete task.  Sit > stand with supervision while pt pulled pants over hips. Setup with hot chocolate with cues to visually scan to midline to locate cup from therapist.  Completed stand pivot transfer min assist to Lt to transfer from w/c to recliner.  Left semi-reclined with BLE elevated, quick release belt intact and call bell in reach.  Therapy Documentation Precautions:  Precautions Precautions: Fall Precaution Comments: Swallowing precautions Restrictions Weight Bearing Restrictions: No General:   Vital Signs: Therapy Vitals Temp: 98 F (36.7 C) Temp Source: Oral Pulse Rate: 83 Resp: 18 BP: (!) 149/82 Patient Position (if appropriate): Lying Oxygen Therapy SpO2: 98 % O2  Device: Not Delivered Pain: Pain Assessment Pain Assessment: No/denies pain Pain Score: 0-No pain  See Function Navigator for Current Functional Status.   Therapy/Group: Individual Therapy  Rosalio LoudHOXIE, Henny Strauch 05/15/2016, 9:25 AM

## 2016-05-15 NOTE — Progress Notes (Signed)
Patient was noted asleep numerous times after previous medication given. No signs of distress noted. Will continue to monitor.

## 2016-05-15 NOTE — Progress Notes (Addendum)
[place] [facility] PHYSICAL MEDICINE & REHABILITATION     PROGRESS NOTE    Subjective/Complaints: "I'm cold" Resting comfortably in net bed no evidence of agitation  ROS limited by mental status,   Objective: Vital Signs: Blood pressure (!) 181/95, pulse 80, temperature 97.7 F (36.5 C), temperature source Oral, resp. rate (!) 22, height '5\' 9"'  (1.753 m), weight 89.5 kg (197 lb 5 oz), SpO2 100 %. Ct Head Wo Contrast  Result Date: 05/14/2016 CLINICAL DATA:  Acute mental status change. EXAM: CT HEAD WITHOUT CONTRAST TECHNIQUE: Contiguous axial images were obtained from the base of the skull through the vertex without intravenous contrast. COMPARISON:  April 14, 2016 FINDINGS: Brain: No subdural, epidural, or subarachnoid hemorrhage is identified on today's study. The intraparenchymal hemorrhage centered in the right parietal and temporal lobes has resolved. Decreased attenuation in this region is consistent with resulting encephalomalacia involving the white matter greater than the cortex. The patient is status post a left occipital infarct with encephalomalacia. The cerebellum, brainstem, and basal cisterns are normal. The ventricles and sulci are unchanged. No blood seen in the ventricles on today's study. The midline shift has resolved. A lacunar infarct is seen in the right caudate. No acute cortical ischemia or infarct is identified. Vascular: Chronic calcifications are stable. Vessels are otherwise unchanged. Skull: The patient is status post right craniotomy. The bones are otherwise stable. Sinuses/Orbits: The paranasal sinuses, left mastoid air cells, and left middle ear well aerated. There is fluid in the right mastoid air cells and middle ear which was not seen previously. No bony erosion or overlying soft tissue swelling. Other: Extracranial soft tissues are unremarkable. IMPRESSION: 1. The right parietal temporal intraparenchymal hemorrhage has resolved. There is encephalomalacia developing in  this region involving the white matter greater than the cortex. 2. No acute hemorrhage. 3. Fluid in the right middle ear and right mastoid air cells with no associated bony erosion or soft tissue swelling. Recommend clinical correlation. Electronically Signed   By: Dorise Bullion III M.D   On: 05/14/2016 15:13    Recent Labs  05/14/16 1408  WBC 4.0  HGB 11.9*  HCT 36.5*  PLT 153    Recent Labs  05/14/16 1408  NA 137  K 3.7  CL 105  GLUCOSE 141*  BUN 18  CREATININE 1.01  CALCIUM 9.1   CBG (last 3)   Recent Labs  05/13/16 1631 05/13/16 2131 05/14/16 2029  GLUCAP 156* 198* 133*    Wt Readings from Last 3 Encounters:  05/14/16 89.5 kg (197 lb 5 oz)  04/26/16 117 kg (258 lb)  08/18/13 89.1 kg (196 lb 6.4 oz)    Physical Exam:  Constitutional: He appears well-developed and well-nourished.  HENT: Normocephalic and atraumatic.  Eyes: Conjunctivae and EOM are normal.   Cardiovascular: Normal rate and regular rhythm.   Respiratory: Effort normal and breath sounds normal. No respiratory distress. He has no wheezes. He has no rales.  GI: Soft. Bowel sounds are normal. He exhibits no distension. There is no tenderness.  Musculoskeletal: He exhibits no edema  Neurological: He is alert.  Follows commands.  Motor: LUE 4+/5 proximal to distal LLE: 4/5 hip flexion, 5/5 knee extension, 5/5 ankle dorsi/plantflexion.   RUE/RLE: 5/5  Skin: Skin is warm and dry. No rash noted. No erythema.  Psychiatric:  Sleepy but arouses to voice, child-like "am I going to be ok?"  Assessment/Plan: 1. Cognitive, mobility, and visual-spatial deficits secondary to right parietal-temporal ICH which require 3+ hours per day  of interdisciplinary therapy in a comprehensive inpatient rehab setting. Physiatrist is providing close team supervision and 24 hour management of active medical problems listed below. Physiatrist and rehab team continue to assess barriers to discharge/monitor patient progress  toward functional and medical goals.  Function:  Bathing Bathing position   Position: Shower  Bathing parts Body parts bathed by patient: Right arm, Left arm, Chest, Abdomen, Front perineal area, Buttocks, Right upper leg, Left upper leg, Right lower leg, Left lower leg Body parts bathed by helper: Back  Bathing assist Assist Level: Touching or steadying assistance(Pt > 75%)      Upper Body Dressing/Undressing Upper body dressing   What is the patient wearing?: Pull over shirt/dress     Pull over shirt/dress - Perfomed by patient: Put head through opening, Pull shirt over trunk, Thread/unthread right sleeve Pull over shirt/dress - Perfomed by helper: Thread/unthread left sleeve        Upper body assist Assist Level: Touching or steadying assistance(Pt > 75%)      Lower Body Dressing/Undressing Lower body dressing   What is the patient wearing?: Pants, Shoes, [employer]- Performed by patient: Thread/unthread right pants leg, Pull pants up/down Pants- Performed by helper: Thread/unthread left pants leg Non-skid slipper socks- Performed by patient: Don/doff right sock, Don/doff left sock Non-skid slipper socks- Performed by helper: Don/doff right sock, Don/doff left sock     Shoes - Performed by patient: Fasten right, Don/doff right shoe Shoes - Performed by helper: Don/doff right shoe, Don/doff left shoe, Fasten right, Fasten left       TED Hose - Performed by helper: Don/doff right TED hose, Don/doff left TED hose  Lower body assist Assist for lower body dressing:  (Max assist)      Naval architect activity did not occur: No continent bowel/bladder event Toileting steps completed by patient: Performs perineal hygiene Toileting steps completed by helper: Adjust clothing prior to toileting, Adjust clothing after toileting Toileting Assistive Devices: Grab bar or rail  Toileting assist Assist level: Two helpers   Transfers Chair/bed transfer    Chair/bed transfer method: Stand pivot Chair/bed transfer assist level: Touching or steadying assistance (Pt > 75%) Chair/bed transfer assistive device: Armrests, Bedrails     Locomotion Ambulation     Max distance: 150 Assist level: Touching or steadying assistance (Pt > 75%)   Wheelchair   Type: Manual Max wheelchair distance: 50 Assist Level: Dependent (Pt equals 0%)  Cognition Comprehension Comprehension assist level: Understands basic less than 25% of the time/ requires cueing >75% of the time  Expression Expression assist level: Expresses basic 25 - 49% of the time/requires cueing 50 - 75% of the time. Uses single words/gestures.  Social Interaction Social Interaction assist level: Interacts appropriately less than 25% of the time. May be withdrawn or combative.  Problem Solving Problem solving assist level: Solves basic less than 25% of the time - needs direction nearly all the time or does not effectively solve problems and may need a restraint for safety  Memory Memory assist level: Recognizes or recalls less than 25% of the time/requires cueing greater than 75% of the time   Medical Problem List and Plan: 1.  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 Formulized individual treatment plan based on medical history, underlying problem and comorbidities.              -continue CIR therapies PT, OT SLP,  - 2.  DVT Prophylaxis/Anticoagulation: Mechanical: Sequential compression devices, below knee Bilateral lower extremities, lovenox 40 qd 3. Pain Management: tylenol and percocet prn. Limit narcs  as possible.  -headaches are improved  -topamax stopped due to poor tolerance 4. Mood: had been displaying improved insight/awareness able to discuss his mood  -Cymbalta 13m (appears to have been started on [facility]) increased to 477m  -Acute change in mental status today---check urine/labs    -no medication changes have been made  -observe for progression 5. Neuropsych: This patient is not capable of making decisions on his own behalf.  -Increased ritalin to 1077m/9 for arousal and to improve attention, d/ced yesterday pt already on cymbalta,cymbalta just increased need to wait for effect 6. Skin/Wound Care: local pressure relief, elevate lower ext                       -optimize nutrition 7. Fluids/Electrolytes/Nutrition:   Dysphagia 2 thin  -K+ 4.2 on 8/11  -repleting K+, increased to 52m25mid              -follow I's and O's daily    8. Urine retention: removed foley.                       -improved but incont 9. Fluid overload- lasix 60mg50m--no LE edema will reduce to 52mg 53m                      -daily weights slightly unreliable, but overall trending down                       -follow I and O's Filed Weights   05/12/16 0419 05/13/16 0832 05/14/16 0617  Weight: 89.9 kg (198 lb 3.1 oz) 89.5 kg (197 lb 4.8 oz) 89.5 kg (197 lb 5 oz)  10. DM2              -lantus-- increased to 12u bid (on 15u at home)  -continue to titrate to effect  -SSI   CBG (last 3)   Recent Labs  05/13/16 1631 05/13/16 2131 05/14/16 2029  GLUCAP 156* 198* 133*    11. HTN:  -continue hydralazine, lasix, catapres, lisinopril Vitals:   05/14/16 0617 05/14/16 1430  BP: (!) 185/98 (!) 181/95  Pulse: 90 80  Resp: (!) 22   Temp: 97.7 F (36.5 C)      -catapres 0.2mg ti65mincreased to 0.3mg on 43m   hydralazine increased to 100 on 8/5                       -Lisinopril increased to 52mg qd 70m/8   -added amlodipine 5mg/day o58m/9-   - BP improving 12. Seizure proph: keppra 500mg bid 174mABLA--hgb trending up  -10.7 on 8/1   LOS (Days) 19 A FACE TO FACE EVALUATION WAS PERFORMED  Ernest [name],ACharlett Blake6:43 AM

## 2016-05-15 NOTE — Progress Notes (Signed)
Physical Therapy Session Note  Patient Details  Name: Ernest [name] MRN: [id number] Date of Birth: Nov 26, 1955  Today's Date: 05/15/2016 PT Individual Time: [id number] and 1130-1202 PT Individual Time Calculation (min): 51 min and 32 min    Short Term Goals: Week 3:  PT Short Term Goal 1 (Week 3): =LTG due to estimated LOS  Skilled Therapeutic Interventions/Progress Updates:   Pt received in recliner with quick release belt asleep; pt required increased tactile cues and verbal cues to awaken but pt with difficulty staying awake but able to nod head yes/no appropriately to questions.  Vitals assessed and all WFL.  Pt performed sit > stand and ambulated to/from toilet with min-mod HHA and pt stood with min-mod A for balance while pt performed clothing doffing and donning; pt required min-mod constant assist to maintain balance on toilet due to falling asleep.  Returned to w/c and pt transported to gym where pt performed negotiation of 12 stairs with bilat rails and min-mod A but max verbal cues to sequence and locate stairs with pt self selecting alternating sequence and reviewed with pt one step negotiation for home entry/exit with pt performing up/down one step x 4 reps with mod HHA and verbal cues for safe stepping sequence.  Returned to room and pt performed ambulation to recliner with min HHA and pt left in recliner with quick release belt in place to rest before next session.   Second session:  Pt asleep in recliner but easier to awaken.  Pt performed transfer to w/c with min-mod HHA stand pivot.  Transported to gym and performed simulated car transfer stand pivot with HHA and mod-max verbal cues to sequence pivot and mod A to bring LE into car but able to bring LE out of car with supervision.  Performed ambulation x 75' x 2 reps with pt passing football from LUE <> RUE and min-mod A to facilitate attention to L and for dual task during gait.  Performed visual scanning from R > midline for football  pass with focus on reaching up, forwards and R to facilitate weight shifting R and forwards due to continued L, posterior bias.  Returned to w/c and to room where pt performed transfer to recliner with min HHA.  Pt left in recliner with LE elevated, quick release belt donned and set up with tray for lunch.  Pt more awake and alert second session; no signs of combativeness or aggression-pt very grateful and agreeable to therapy today.  Therapy Documentation Precautions:  Precautions Precautions: Fall Precaution Comments: Swallowing precautions Restrictions Weight Bearing Restrictions: No Vital Signs: BP: 112/66, HR: 82 bpm, 99%, 98.6 temperature Pain: Pain Assessment Pain Assessment: No/denies pain Pain Score: 0-No pain  See Function Navigator for Current Functional Status.   Therapy/Group: Individual Therapy  Edman CircleHall, Yunis Voorheis [facility] Inc[facility]aucette 05/15/2016, 11:16 AM

## 2016-05-15 NOTE — Plan of Care (Signed)
Problem: RH COGNITION-NURSING Goal: RH STG USES MEMORY AIDS/STRATEGIES W/ASSIST TO PROBLEM SOLVE STG Uses Memory Aids/Strategies With mod Assistance to Problem Solve.  Outcome: Not Met (add Reason) Patient still needs max cueing & reminders.   Comments: Patient still needs max cueing & reminders. At this time, he does not remember the events of the last few days. Needs reorientation frequently.

## 2016-05-15 NOTE — Progress Notes (Signed)
Nutrition Brief Note  No family present during time of visit. Per RN, family plans to be present tomorrow. RD will plan to educate wife/family on diet education tomorrow.   Roslyn SmilingStephanie Alberta Cairns, MS, RD, LDN Pager # [phone] After hours/ weekend pager # [phone]

## 2016-05-15 NOTE — Plan of Care (Signed)
Problem: RH Dressing Goal: LTG Patient will perform upper body dressing (OT) LTG Patient will perform upper body dressing with assist, with/without cues (OT).  Downgraded due to slow progress and visual deficits Goal: LTG Patient will perform lower body dressing w/assist (OT) LTG: Patient will perform lower body dressing with assist, with/without cues in positioning using equipment (OT)  Downgraded due to slow progress and visual deficits

## 2016-05-16 ENCOUNTER — Inpatient Hospital Stay (HOSPITAL_COMMUNITY): Payer: Medicare Other | Admitting: Occupational Therapy

## 2016-05-16 ENCOUNTER — Inpatient Hospital Stay (HOSPITAL_COMMUNITY): Payer: Medicare Other | Admitting: Speech Pathology

## 2016-05-16 ENCOUNTER — Inpatient Hospital Stay (HOSPITAL_COMMUNITY): Payer: Medicare Other | Admitting: Physical Therapy

## 2016-05-16 LAB — GLUCOSE, CAPILLARY
Glucose-Capillary: 115 mg/dL — ABNORMAL HIGH (ref 65–99)
Glucose-Capillary: 275 mg/dL — ABNORMAL HIGH (ref 65–99)

## 2016-05-16 MED ORDER — CHOLECALCIFEROL 25 MCG (1000 UT) PO TABS: 1000.0000 [IU] | ORAL_TABLET | Freq: Every day | ORAL | 0 refills | Status: AC

## 2016-05-16 MED ORDER — AMLODIPINE BESYLATE 10 MG PO TABS: 5.0000 mg | ORAL_TABLET | Freq: Every day | ORAL | 0 refills | Status: AC

## 2016-05-16 MED ORDER — LISINOPRIL 40 MG PO TABS: 40.0000 mg | ORAL_TABLET | Freq: Every day | ORAL | 0 refills | Status: AC

## 2016-05-16 MED ORDER — TAMSULOSIN HCL 0.4 MG PO CAPS: 0.4000 mg | ORAL_CAPSULE | Freq: Every day | ORAL | 0 refills | Status: AC

## 2016-05-16 MED ORDER — HYDRALAZINE HCL 100 MG PO TABS: 100.0000 mg | ORAL_TABLET | Freq: Three times a day (TID) | ORAL | 0 refills | Status: AC

## 2016-05-16 MED ORDER — POLYETHYLENE GLYCOL 3350 17 G PO PACK: 17.0000 g | PACK | Freq: Every day | ORAL | 0 refills | Status: AC

## 2016-05-16 MED ORDER — INSULIN DETEMIR 100 UNIT/ML ~~LOC~~ SOLN: 12.0000 [IU] | Freq: Two times a day (BID) | SUBCUTANEOUS | 11 refills | Status: AC

## 2016-05-16 MED ORDER — CYCLOBENZAPRINE HCL 5 MG PO TABS: 5.0000 mg | ORAL_TABLET | Freq: Every day | ORAL | 0 refills | Status: AC

## 2016-05-16 MED ORDER — LEVETIRACETAM 500 MG PO TABS: 500.0000 mg | ORAL_TABLET | Freq: Two times a day (BID) | ORAL | 0 refills | Status: AC

## 2016-05-16 MED ORDER — INSULIN GLARGINE 100 UNIT/ML ~~LOC~~ SOLN: 12.0000 [IU] | Freq: Two times a day (BID) | SUBCUTANEOUS | 12 refills | Status: DC

## 2016-05-16 MED ORDER — FUROSEMIDE 40 MG PO TABS: 40.0000 mg | ORAL_TABLET | Freq: Two times a day (BID) | ORAL | 0 refills | Status: AC

## 2016-05-16 MED ORDER — DULOXETINE HCL 30 MG PO CPEP: 30.0000 mg | ORAL_CAPSULE | Freq: Every day | ORAL | 0 refills | Status: DC

## 2016-05-16 MED ORDER — CLONIDINE HCL 0.3 MG PO TABS: 0.3000 mg | ORAL_TABLET | Freq: Three times a day (TID) | ORAL | 0 refills | Status: AC

## 2016-05-16 MED ORDER — FOLIC ACID 1 MG PO TABS: 1.0000 mg | ORAL_TABLET | Freq: Every day | ORAL | 0 refills | Status: AC

## 2016-05-16 MED ORDER — POTASSIUM CHLORIDE CRYS ER 20 MEQ PO TBCR: 40.0000 meq | EXTENDED_RELEASE_TABLET | Freq: Three times a day (TID) | ORAL | 0 refills | Status: AC

## 2016-05-16 NOTE — Plan of Care (Signed)
Problem: RH Swallowing Goal: LTG Patient will consume least restrictive PO diet (SLP) LTG:  Patient will consume least restrictive PO diet with assist for use of compensatory strategies  (SLP)  Outcome: Not Met (add Reason) Hand over hand assist needed due to poor task initiation and significant visual impairment   Problem: RH Attention Goal: LTG Patient will demonstrate focused/sustained (SLP) LTG:  Patient will demonstrate focused/sustained/selective/alternating/divided attention during cognitive/linguistic activities in specific environment with assist for # of minutes   (SLP)  Outcome: Not Met (add Reason) Pt only able to sustain his attention to tasks for ~30 second intervals   Problem: RH Awareness Goal: LTG: Patient will demonstrate intellectual/emergent (SLP) LTG: Patient will demonstrate intellectual/emergent/anticipatory awareness with assist during a cognitive/linguistic activity  (SLP)  Outcome: Not Met (add Reason) Max assist

## 2016-05-16 NOTE — Discharge Instructions (Signed)
Inpatient Rehab Discharge Instructions  Ernest EagleWillie [name] Discharge date and time:    Activities/Precautions/ Functional Status: Activity: No lifting, driving, or strenuous exercise till cleared by MD.  Diet: diabetic/Cardiac diet Wound Care: keep wound clean and dry   Functional status:  ___ No restrictions     ___ Walk up steps independently _X__ 24/7 supervision/assistance   ___ Walk up steps with assistance ___ Intermittent supervision/assistance  ___ Bathe/dress independently ___ Walk with walker     ___ Bathe/dress with assistance ___ Walk Independently    ___ Shower independently _X__ Walk with supervision    ___ Shower with assistance _X__ No alcohol     ___ Return to work/school ________    COMMUNITY REFERRALS UPON DISCHARGE: Home Health:   PT     OT     ST    RN    SNA  Agency:  [facility] Phone:  [phone] Medical Equipment/Items Ordered:  Rolling walker; 3-in-1 commode; 18"x16" basic cushion  Agency/Supplier:  [facility]         Phone:  [phone]  GENERAL COMMUNITY RESOURCES FOR PATIENT/FAMILY: Support Groups:  [place] [facility] Support [facility]                              Meets the 2nd Tuesday of the month from 3-4PM                              [facility] [address]   Special Instructions: 1. Check blood sugars before meals and at bedtime. Use sliding scale insulin as per home regimen. 2. Needs hand over hand assist with meals. No straws. Small bites/sips.  3. Offer fluids frequently.      My questions have been answered and I understand these instructions. I will adhere to these goals and the provided educational materials after my discharge from the hospital.  Patient/Caregiver Signature _______________________________ Date __________  Clinician Signature _______________________________________ Date __________  Please bring this form and your medication list with you to all your follow-up  doctor's appointments.

## 2016-05-16 NOTE — Progress Notes (Signed)
Patient discharged to home, accompanied by his wife. 

## 2016-05-16 NOTE — Progress Notes (Signed)
Occupational Therapy Discharge Summary  Patient Details  Name: Ernest [name] MRN: [id number] Date of Birth: Jun 13, 1956  Patient has met 9 of 10 long term goals due to improved activity tolerance, improved balance, postural control, ability to compensate for deficits and functional use of  LEFT upper and LEFT lower extremity.  Patient to discharge at [facility] Assist level.  Patient's care partner is independent to provide the necessary physical and cognitive assistance at discharge.  Patient's wife was present for hands on family education and was able to demonstrate ability to provide appropriate cues and physical guidance during transfers.  Reasons goals not met: Pt continues to require mod assist with toileting due to requiring assist to adjust clothing prior to toileting due to decreased attention and impulsivity.  Recommendation:  Patient will benefit from ongoing skilled OT services in home health setting to continue to advance functional skills in the area of BADL and Reduce care partner burden.  Equipment: 3 in 1 commode  Reasons for discharge: treatment goals met and discharge from hospital  Patient/family agrees with progress made and goals achieved: Yes  OT Discharge Precautions/Restrictions  Precautions Precautions: Fall Precaution Comments: L field cut Restrictions Weight Bearing Restrictions: No General   Vital Signs Therapy Vitals BP: 135/78 Pain Pain Assessment Pain Assessment: No/denies pain ADL   Vision/Perception  Vision- History Baseline Vision/History: Wears glasses Wears Glasses: Reading only Patient Visual Report: No change from baseline Vision- Assessment Vision Assessment?: Yes Ocular Range of Motion: Restricted on the left Alignment/Gaze Preference: Gaze right Tracking/Visual Pursuits: Right eye does not track medially;Left eye does not track laterally;Decreased smoothness of horizontal tracking;Decreased smoothness of vertical tracking;Other  (comment) (Unable to track, pt eyes stay in Rt visual field ) Visual Fields: Left visual field deficit  Cognition Overall Cognitive Status: Impaired/Different from baseline Arousal/Alertness: Lethargic Orientation Level: Oriented to person;Disoriented to place;Disoriented to time;Disoriented to situation Attention: Sustained Sustained Attention: Impaired Sustained Attention Impairment: Verbal basic;Functional basic Memory: Impaired Memory Impairment: Decreased recall of new information;Decreased short term memory;Retrieval deficit Awareness: Impaired Awareness Impairment: Intellectual impairment Problem Solving: Impaired Problem Solving Impairment: Verbal basic;Functional basic Safety/Judgment: Impaired Sensation Sensation Light Touch: Impaired Detail Light Touch Impaired Details: Impaired LUE;Impaired LLE Proprioception: Impaired Detail Proprioception Impaired Details: Impaired LUE;Impaired LLE Coordination Gross Motor Movements are Fluid and Coordinated: No Fine Motor Movements are Fluid and Coordinated: No Coordination and Movement Description: LUE apraxia Finger Nose Finger Test: unable to complete due to decreased vision and attention Motor  Motor Motor: Hemiplegia;Abnormal tone;Motor apraxia Mobility  Bed Mobility Bed Mobility: Supine to Sit;Sit to Supine Supine to Sit: 4: Min assist;HOB flat Sit to Supine: 4: Min assist;HOB flat Transfers Sit to Stand: 4: Min assist;4: Min guard;5: Supervision;With upper extremity assist Stand to Sit: 5: Supervision;4: Min guard;With upper extremity assist  Trunk/Postural Assessment     Balance Balance Balance Assessed: Yes Static Sitting Balance Static Sitting - Level of Assistance: 5: Stand by assistance Dynamic Sitting Balance Dynamic Sitting - Level of Assistance: 5: Stand by assistance Static Standing Balance Static Standing - Level of Assistance: 4: Min assist Dynamic Standing Balance Dynamic Standing - Level of  Assistance: 4: Min assist Extremity/Trunk Assessment RUE Assessment RUE Assessment: Within Functional Limits LUE Assessment LUE Assessment: Exceptions to WFL LUE AROM (degrees) Overall AROM Left Upper Extremity: Deficits;Other (comment) (LUE grossly 110 degrees, difficult to assess due to impaired attention) LUE PROM (degrees) Overall PROM Left Upper Extremity: Within functional limits for tasks assessed LUE Strength LUE Overall Strength: Deficits (grossly 3/5)  See Function Navigator for Current Functional Status.  Simonne Come 05/16/2016, 4:08 PM

## 2016-05-16 NOTE — Progress Notes (Signed)
Speech Language Pathology Discharge Summary  Patient Details  Name: Ernest [name] MRN: [id number] Date of Birth: 04/07/1956  Today's Date: 05/16/2016 SLP Individual Time: 1104-1155 SLP Individual Time Calculation (min): 51 min    Skilled Therapeutic Interventions:  Pt was seen for skilled ST targeting family education prior to discharge.  Therapist discussed pt's current cognitive limitations and their impact on pt's ability to complete basic self care tasks in the home environment.  Discussed the implications of profound attention impairment on all higher level cognitive processes and strategies for distraction management.  Therapist reviewed and reinforced team's recommendations that pt have 24/7 supervision at discharge in addition to ST follow up at next level of care to continue to address cognitive and swallowing needs. SLP also facilitated the session with skilled education regarding techniques to maximize pt's swallowing safety during meals.  Pt's wife returned demonstration of hand over hand assistance for initiation to maximize pt's independence for self feeding.  She also cued pt appropriately when taking too large of bites/sips.  SLP discussed recommended dys 2 textures given cognitive impairment and decreased attention to boluses and provided pt's wife with a handout of recommended consistencies.  All questions were answered to her satisfaction at this time.  Pt and family education is complete and pt is scheduled for discharge this afternoon.  Pt left in wheelchair with family at bedside.       Patient has met 2 of 5 long term goals.  Patient to discharge at overall Mod;Max level.  Reasons goals not met:     Clinical Impression/Discharge Summary:  Pt made slow, limited gains while inpatient and is discharging having met 2 out of 5 long ter goals.  Pt is consuming dys 2 textures and thin liquids with full supervision for use of swallowing precautions due to significant cognitive  impairment.  Pt requires mod-max assist multimodal cues for basic, familiar tasks due to significant left inattention with field cut, decreased sustained attention to tasks, perseveration, fluctuating orientation, and limited intellectual awareness of deficits.  Pt and family education is complete at this time.  Pt is discharging home with 24/7 supervision from family.     Care Partner:  Caregiver Able to Provide Assistance: Yes  Type of Caregiver Assistance: Physical;Cognitive  Recommendation:  Home Health SLP;Outpatient SLP;24 hour supervision/assistance  Rationale for SLP Follow Up: Maximize cognitive function and independence;Reduce caregiver burden;Maximize swallowing safety   Equipment: none recommended by SLP    Reasons for discharge: Discharged from hospital   Patient/Family Agrees with Progress Made and Goals Achieved: Yes   Function:  Eating Eating   Modified Consistency Diet: Yes Eating Assist Level: Hand over hand assist           Cognition Comprehension Comprehension assist level: Understands basic 50 - 74% of the time/ requires cueing 25 - 49% of the time  Expression   Expression assist level: Expresses basic 50 - 74% of the time/requires cueing 25 - 49% of the time. Needs to repeat parts of sentences.  Social Interaction Social Interaction assist level: Interacts appropriately 25 - 49% of time - Needs frequent redirection.  Problem Solving Problem solving assist level: Solves basic 25 - 49% of the time - needs direction more than half the time to initiate, plan or complete simple activities  Memory Memory assist level: Recognizes or recalls less than 25% of the time/requires cueing greater than 75% of the time   ,  L 05/16/2016, 4:16 PM    

## 2016-05-16 NOTE — Discharge Summary (Signed)
Physician Discharge Summary  Patient ID: Ernest [name] Galyon MRN: [id number] DOB/AGE: [birth year] 60 y.o.  Admit date: 04/26/2016 Discharge date: 05/16/2016  Discharge Diagnoses:  Principal Problem:   Nontraumatic cortical hemorrhage of right cerebral hemisphere [facility](HCC) Active Problems:   Essential hypertension   Diabetes mellitus type 2 in nonobese (HCC)   Left-sided neglect   Other hypervolemia   Adjustment disorder with depressed mood   UTI (urinary tract infection), uncomplicated   Discharged Condition: Stable   Significant Diagnostic Studies: Ct Head Wo Contrast  Result Date: 05/14/2016 CLINICAL DATA:  Acute mental status change. EXAM: CT HEAD WITHOUT CONTRAST TECHNIQUE: Contiguous axial images were obtained from the base of the skull through the vertex without intravenous contrast. COMPARISON:  April 14, 2016 FINDINGS: Brain: No subdural, epidural, or subarachnoid hemorrhage is identified on today's study. The intraparenchymal hemorrhage centered in the right parietal and temporal lobes has resolved. Decreased attenuation in this region is consistent with resulting encephalomalacia involving the white matter greater than the cortex. The patient is status post a left occipital infarct with encephalomalacia. The cerebellum, brainstem, and basal cisterns are normal. The ventricles and sulci are unchanged. No blood seen in the ventricles on today's study. The midline shift has resolved. A lacunar infarct is seen in the right caudate. No acute cortical ischemia or infarct is identified. Vascular: Chronic calcifications are stable. Vessels are otherwise unchanged. Skull: The patient is status post right craniotomy. The bones are otherwise stable. Sinuses/Orbits: The paranasal sinuses, left mastoid air cells, and left middle ear well aerated. There is fluid in the right mastoid air cells and middle ear which was not seen previously. No bony erosion or overlying soft tissue swelling. Other: Extracranial  soft tissues are unremarkable. IMPRESSION: 1. The right parietal temporal intraparenchymal hemorrhage has resolved. There is encephalomalacia developing in this region involving the white matter greater than the cortex. 2. No acute hemorrhage. 3. Fluid in the right middle ear and right mastoid air cells with no associated bony erosion or soft tissue swelling. Recommend clinical correlation. Electronically Signed   By: Gerome Samavid  Williams III M.D   On: 05/14/2016 15:13   Dg Chest Port 1 View  Result Date: 04/17/2016 CLINICAL DATA:  CVA. EXAM: PORTABLE CHEST 1 VIEW COMPARISON:  04/14/2016. FINDINGS: Right PICC line noted with tip at cavoatrial junction. Cardiomegaly with mild pulmonary vascular prominence. No overt pulmonary edema. Low lung volumes with mild bibasilar atelectasis. No pleural effusion or pneumothorax. IMPRESSION: 1. Right PICC line noted with tip at cavoatrial junction. 2. Cardiomegaly with pulmonary vascular prominence. No evidence of overt congestive heart failure. Mild bibasilar subsegmental atelectasis. Electronically Signed   By: Maisie Fushomas  Register   On: 04/17/2016 07:33   Dg Abd Portable 1v  Result Date: 04/22/2016 CLINICAL DATA:  Abdominal pain and distension. EXAM: PORTABLE ABDOMEN - 1 VIEW COMPARISON:  None. FINDINGS: There is moderate air in the small bowel and colon which could reflect a mild ileus or gastroenteritis. No findings for obstruction or perforation. The lung bases are clear. The bony structures are intact. IMPRESSION: Possible ileus or gastroenteritis. No findings for obstruction or perforation. Electronically Signed   By: Rudie MeyerP.  Gallerani M.D.   On: 04/22/2016 19:18   Labs:  Basic Metabolic Panel:  Recent Labs Lab 05/10/16 0651 05/14/16 1408  NA 136 137  K 4.2 3.7  CL 102 105  CO2 25 24  GLUCOSE 127* 141*  BUN 17 18  CREATININE 1.07 1.01  CALCIUM 8.9 9.1    CBC:  Recent Labs Lab  05/14/16 1408  WBC 4.0  NEUTROABS 1.7  HGB 11.9*  HCT 36.5*  MCV 89.2   PLT 153    CBG:  Recent Labs Lab 05/15/16 1206 05/15/16 1648 05/15/16 2112 05/16/16 0642 05/16/16 1128  GLUCAP 177* 169* 127* 115* 275*    Brief HPI:   Ernest [name] Nussbaumer is a 60 year old male with history of T2DM, HTN, seizure disorder, prior CVA who [facility] with left facial droop and left sided weakness due to large acute IPH involving right parietal and temporal region with possible IVH.  Conservative therapy as well as BP management of malignant BP recommended. He did have neurological decline and was taken to OR on 07/11 for emergent right crani for evacuation of hemorrhage by Dr. Jonathon JordanZupruk. Post op he has had issues with elevated BP as well as anasarca due to fluid overload. He was treated with IV lasix and [facility]  [facility]   He continued to require IV diuresis and compression wraps for BLE edema. Foley was placed on 7/25 due to problems with retention. Lasix was changed to po and he was started on flomax. He was started on bowel regimen for abdominal pain with question of mild ileus.   Patient with resultant left sided weakness with confusion and lability, left sensory deficits with right gaze preference Therapy ongoing and he is showing improvement in endurance as well as improvement in ability to follow simple commands with cues. Chart reviewed by Rehab MD and patient felt to be CIR candidate   Hospital Course: Ernest [name] Pullman was admitted to rehab 04/26/2016 for inpatient therapies to consist of PT, ST and OT at least three hours five days a week. Past admission physiatrist, therapy team and rehab RN have worked together to provide customized collaborative inpatient rehab. He was noted to have issues with poor attention as well as confusion and headaches. Cymbalta was increased to 40 mg daily to help with mood and pain management. He was unable to tolerate low dose topamax and narcotics were avoided to prevent worsening of confusion.  Foley was  discontinued and voiding was monitored with PVR checks. UCS showed 30,000 colonies of Klebsiella and he was treated with 3 day course of cipro.  Lytes have been monitored with serial checks and hypokalemia was managed with increase in potassium supplement.   He was monitored for signs of overload with daily weights and weights have been stable even with decrease in lasix to 40 mg bid.   Blood pressures were monitored on bid basis and have been relatively controlled. He continues to require hand over hand assist for meals and intake. He continues on dysphagia 2 diet due to cognitive impairment and decreased attention to bolus size and aspiration risk.  Lantus was resumed and titrated to 30 units bid for tighter blood sugar control. Ritalin was added to help with attention, activation and mood.  He developed sever agitation with combativeness on 08/15. CT head, lytes and urine culture were all negative. Ritalin was discontinued and Cymbalta was decreased to 30 mg daily. Agitation was felt to be due to serotonin syndrome from interaction of ritalin and Cymbalta. Patient was back to baseline by 08/16 am. He continues to be limited by significant cognitive deficits with limited carryover, lack of safety awareness and visual deficits with left gaze preference.  He has progressed to min assist level and will continue to receive follow up HHPT, HHOT, HHST , HH aide and HHRN by [facility] after discharge.     Rehab course: During patient's stay in rehab weekly team conferences were held to monitor patient's progress, set goals and discuss barriers to discharge. At admission, patient required max assist with basic self care tasks and mobility. He was oriented to self only and required max/consistent verbal and tactile cues to attend to tasks. He has had improvement in activity tolerance, balance, postural control, as well as ability to compensate for deficits. He is has had improvement in functional use LUE  and  LLE as well as improved awareness He is able to complete ADL tasks with min assist and requires moderate assist for toileting. He is requires supervision for transfers and min to min/guard assist for ambulating 150 feet with RW.  He requires supervision with meals for safe swallow strategies. He requires moderate assist for orientation and to attend to tasks.     Disposition: 01-Home or Self Care  Diet: Dysphagia 2, thin liquids.    Special Instructions: 1. Check blood sugars before meals and at bedtime. Use sliding scale insulin as per home regimen. 2. Needs hand over hand assist with meals. No straws. Small bites/sips.  3. Offer fluids frequently.    Discharge Instructions    Ambulatory referral to Physical Medicine Rehab    Complete by:  As directed   1 -2 week follow up        Medication List    STOP taking these medications   clindamycin 150 MG capsule Commonly known as:  CLEOCIN   doxycycline 50 MG capsule Commonly known as:  VIBRAMYCIN   glipiZIDE 10 MG tablet Commonly known as:  GLUCOTROL   insulin glargine 100 UNIT/ML injection Commonly known as:  LANTUS   lisinopril-hydrochlorothiazide 20-25 MG tablet Commonly known as:  PRINZIDE,ZESTORETIC   metFORMIN 500 MG 24 hr tablet Commonly known as:  GLUCOPHAGE-XR   oxyCODONE 5 MG immediate release tablet Commonly known as:  Oxy IR/ROXICODONE   propranolol 40 MG tablet Commonly known as:  INDERAL     TAKE these medications   amLODipine 10 MG tablet Commonly known as:  NORVASC Take 0.5 tablets (5 mg total) by mouth daily. What changed:  how much to take   Cholecalciferol 1000 units tablet Take 1 tablet (1,000 Units total) by mouth daily.   cloNIDine 0.3 MG tablet Commonly known as:  CATAPRES Take 1 tablet (0.3 mg total) by mouth 3 (three) times daily.   cyclobenzaprine 5 MG tablet Commonly known as:  FLEXERIL Take 1 tablet (5 mg total) by mouth at bedtime.   DULoxetine 30 MG capsule Commonly known  as:  CYMBALTA Take 1 capsule (30 mg total) by mouth daily.   folic acid 1 MG tablet Commonly known as:  FOLVITE Take 1 tablet (1 mg total) by mouth daily.   furosemide 40 MG tablet Commonly known as:  LASIX Take 1 tablet (40 mg total) by mouth 2 (two) times daily.   hydrALAZINE 100 MG tablet Commonly known as:  APRESOLINE Take 1 tablet (100 mg total) by mouth 4 (four) times daily -  with meals and at bedtime.   insulin detemir 100 UNIT/ML injection Commonly known as:  LEVEMIR Inject 0.12 mLs (12 Units total) into the skin 2 (two) times daily.   levETIRAcetam 500 MG tablet Commonly known as:  KEPPRA Take 1 tablet (500 mg total) by mouth 2 (two) times daily.   lisinopril 40 MG tablet Commonly known as:  PRINIVIL,ZESTRIL Take 1 tablet (40 mg total) by mouth daily.  multivitamin with minerals tablet Take 1 tablet by mouth daily.   omeprazole 20 MG capsule Commonly known as:  PRILOSEC Take 40 mg by mouth daily.   polyethylene glycol packet Commonly known as:  MIRALAX / GLYCOLAX Take 17 g by mouth daily.   potassium chloride SA 20 MEQ tablet Commonly known as:  K-DUR,KLOR-CON Take 2 tablets (40 mEq total) by mouth 3 (three) times daily. What changed:  how much to take  when to take this   tamsulosin 0.4 MG Caps capsule Commonly known as:  FLOMAX Take 1 capsule (0.4 mg total) by mouth daily after supper.   thiamine 100 MG tablet Commonly known as:  VITAMIN B-1 Take 100 mg by mouth daily.      Follow-up Information    Erick Colace, MD .   Specialty:  Physical Medicine and Rehabilitation Why:  office will call you with follow up appointment  Contact information: [address] [place] [address] [phone]        [facility]. Go today.   Why:  within 1-2 weeks Contact information: [address] [place] [address] [phone]        Asa Lente, MD. Schedule an appointment as soon as  possible for a visit in 1 day(s).   Specialty:  Neurosurgery Contact information: [address] [place] [address] [phone]           Signed: Jacquelynn Cree 05/16/2016, 7:31 PM

## 2016-05-16 NOTE — Progress Notes (Signed)
[place] PHYSICAL MEDICINE & REHABILITATION     PROGRESS NOTE    Subjective/Complaints: "I'm cold" Resting comfortably in net bed no evidence of agitation  ROS limited by mental status,   Objective: Vital Signs: Blood pressure 132/82, pulse 84, temperature 98 F (36.7 C), temperature source Oral, resp. rate 18, height 5\' 9"  (1.753 m), weight 89.5 kg (197 lb 4.8 oz), SpO2 100 %. Ct Head Wo Contrast  Result Date: 05/14/2016 CLINICAL DATA:  Acute mental status change. EXAM: CT HEAD WITHOUT CONTRAST TECHNIQUE: Contiguous axial images were obtained from the base of the skull through the vertex without intravenous contrast. COMPARISON:  April 14, 2016 FINDINGS: Brain: No subdural, epidural, or subarachnoid hemorrhage is identified on today's study. The intraparenchymal hemorrhage centered in the right parietal and temporal lobes has resolved. Decreased attenuation in this region is consistent with resulting encephalomalacia involving the white matter greater than the cortex. The patient is status post a left occipital infarct with encephalomalacia. The cerebellum, brainstem, and basal cisterns are normal. The ventricles and sulci are unchanged. No blood seen in the ventricles on today's study. The midline shift has resolved. A lacunar infarct is seen in the right caudate. No acute cortical ischemia or infarct is identified. Vascular: Chronic calcifications are stable. Vessels are otherwise unchanged. Skull: The patient is status post right craniotomy. The bones are otherwise stable. Sinuses/Orbits: The paranasal sinuses, left mastoid air cells, and left middle ear well aerated. There is fluid in the right mastoid air cells and middle ear which was not seen previously. No bony erosion or overlying soft tissue swelling. Other: Extracranial soft tissues are unremarkable. IMPRESSION: 1. The right parietal temporal intraparenchymal hemorrhage has resolved. There is encephalomalacia developing in this  region involving the white matter greater than the cortex. 2. No acute hemorrhage. 3. Fluid in the right middle ear and right mastoid air cells with no associated bony erosion or soft tissue swelling. Recommend clinical correlation. Electronically Signed   By: Gerome Samavid  Williams III M.D   On: 05/14/2016 15:13    Recent Labs  05/14/16 1408  WBC 4.0  HGB 11.9*  HCT 36.5*  PLT 153    Recent Labs  05/14/16 1408  NA 137  K 3.7  CL 105  GLUCOSE 141*  BUN 18  CREATININE 1.01  CALCIUM 9.1   CBG (last 3)   Recent Labs  05/15/16 1648 05/15/16 2112 05/16/16 0642  GLUCAP 169* 127* 115*    Wt Readings from Last 3 Encounters:  05/16/16 89.5 kg (197 lb 4.8 oz)  04/26/16 117 kg (258 lb)  08/18/13 89.1 kg (196 lb 6.4 oz)    Physical Exam:  Constitutional: He appears well-developed and well-nourished.  HENT: Normocephalic and atraumatic.  Eyes: Conjunctivae and EOM are normal.   Cardiovascular: Normal rate and regular rhythm.   Respiratory: Effort normal and breath sounds normal. No respiratory distress. He has no wheezes. He has no rales.  GI: Soft. Bowel sounds are normal. He exhibits no distension. There is no tenderness.  Musculoskeletal: He exhibits no edema  Neurological: He is alert.  Follows commands.  Motor: LUE 4+/5 proximal to distal LLE: 4/5 hip flexion, 5/5 knee extension, 5/5 ankle dorsi/plantflexion.   RUE/RLE: 5/5  Skin: Skin is warm and dry. No rash noted. No erythema.  Psychiatric:  Sleepy but arouses to voice, child-like "am I going to be ok?"  Assessment/Plan: 1. Cognitive, mobility, and visual-spatial deficits secondary to right parietal-temporal ICH which require 3+ hours per day of interdisciplinary  therapy in a comprehensive inpatient rehab setting. Physiatrist is providing close team supervision and 24 hour management of active medical problems listed below. Physiatrist and rehab team continue to assess barriers to discharge/monitor patient progress  toward functional and medical goals.  Function:  Bathing Bathing position   Position: Wheelchair/chair at sink  Bathing parts Body parts bathed by patient: Right arm, Left arm, Chest, Abdomen Body parts bathed by helper: Back  Bathing assist Assist Level: Supervision or verbal cues (only completed UB bathing)      Upper Body Dressing/Undressing Upper body dressing   What is the patient wearing?: Pull over shirt/dress     Pull over shirt/dress - Perfomed by patient: Put head through opening, Pull shirt over trunk, Thread/unthread right sleeve, Thread/unthread left sleeve Pull over shirt/dress - Perfomed by helper: Thread/unthread left sleeve        Upper body assist Assist Level: Set up, Supervision or verbal cues   Set up : To obtain clothing/put away  Lower Body Dressing/Undressing Lower body dressing   What is the patient wearing?: Pants, Shoes, [employer] Station[employer] Hose     Pants- Performed by patient: Pull pants up/down Pants- Performed by helper: Thread/unthread right pants leg, Thread/unthread left pants leg Non-skid slipper socks- Performed by patient: Don/doff right sock, Don/doff left sock Non-skid slipper socks- Performed by helper: Don/doff right sock, Don/doff left sock     Shoes - Performed by patient: Fasten right, Don/doff right shoe Shoes - Performed by helper: Don/doff right shoe, Don/doff left shoe, Fasten right, Fasten left       TED Hose - Performed by helper: Don/doff right TED hose, Don/doff left TED hose  Lower body assist Assist for lower body dressing:  (Total assist)      Toileting Toileting Toileting activity did not occur: No continent bowel/bladder event Toileting steps completed by patient: Performs perineal hygiene Toileting steps completed by helper: Adjust clothing prior to toileting, Adjust clothing after toileting Toileting Assistive Devices: Grab bar or rail  Toileting assist Assist level: Touching or steadying assistance (Pt.75%)    Transfers Chair/bed transfer Chair/bed transfer activity did not occur: N/A Chair/bed transfer method: Ambulatory Chair/bed transfer assist level: Touching or steadying assistance (Pt > 75%) Chair/bed transfer assistive device: Armrests     Locomotion Ambulation     Max distance: 25 Assist level: Touching or steadying assistance (Pt > 75%)   Wheelchair   Type: Manual Max wheelchair distance: 50 Assist Level: Dependent (Pt equals 0%)  Cognition Comprehension Comprehension assist level: Understands basic 50 - 74% of the time/ requires cueing 25 - 49% of the time  Expression Expression assist level: Expresses basic 75 - 89% of the time/requires cueing 10 - 24% of the time. Needs helper to occlude trach/needs to repeat words.  Social Interaction Social Interaction assist level: Interacts appropriately 50 - 74% of the time - May be physically or verbally inappropriate.  Problem Solving Problem solving assist level: Solves basic 25 - 49% of the time - needs direction more than half the time to initiate, plan or complete simple activities  Memory Memory assist level: Recognizes or recalls less than 25% of the time/requires cueing greater than 75% of the time   Medical Problem List and Plan: 1.  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  DVT Prophylaxis/Anticoagulation: Mechanical: Sequential compression devices, below knee Bilateral lower extremities, lovenox 40 qd 3. Pain Management: tylenol and percocet prn. Limit narcs as possible.  -headaches are improved  -topamax stopped due to poor tolerance 4. Mood: had been displaying improved insight/awareness able to discuss his mood  -Cymbalta 30mg  (appears to have been started on [facility]) increased to  40mg .   5. Neuropsych: This patient is not capable of making decisions on his own behalf.  -Increased ritalin to 10mg  8/9 for arousal and to improve attention, d/ced  pt already on cymbalta,cymbalta just increased need to wait for effect 6. Skin/Wound Care: local pressure relief, elevate lower ext                       -optimize nutrition 7. Fluids/Electrolytes/Nutrition:   Dysphagia 2 thin  -K+ 4.2 on 8/11  -repleting K+, increased to tid              -follow I's and O's daily    8. Urine retention: removed foley.                       -improved but incont 9. Fluid overload- lasix 60mg  BID--no LE edema will reduce to 40mg  BID                       -daily weights slightly unreliable, but overall trending down                       -follow I and O's Filed Weights   05/14/16 0617 05/15/16 0651 05/16/16 0611  Weight: 89.5 kg (197 lb 5 oz) 91.2 kg (201 lb 1 oz) 89.5 kg (197 lb 4.8 oz)  10. DM2              -lantus-- increased to 12u bid (on 15u at home)  -continue to titrate to effect  -SSI   CBG (last 3)   Recent Labs  05/15/16 1648 05/15/16 2112 05/16/16 0642  GLUCAP 169* 127* 115*    11. HTN:  -continue hydralazine, lasix, catapres, lisinopril Vitals:   05/15/16 1333 05/16/16 0611  BP: 129/67 132/82  Pulse: 94 84  Resp: 12 18  Temp: 98.4 F (36.9 C) 98 F (36.7 C)     -catapres 0.2mg  tid, increased to 0.3mg  on 8/6   hydralazine increased to 100 on 8/5                       -Lisinopril increased to 40mg  qd on 8/8   -added amlodipine 5mg /day on 8/9-   - BP improving 12. Seizure proph: keppra 500mg  bid 13. ABLA--hgb trending up  -10.7 on 8/1   LOS (Days) 20 A FACE TO FACE EVALUATION WAS PERFORMED  Erick Colace 05/16/2016 7:40 AM

## 2016-05-16 NOTE — Progress Notes (Signed)
Occupational Therapy Session Note  Patient Details  Name: Ernest [name] MRN: [id number] Date of Birth: 11/07/1955  Today's Date: 05/16/2016 OT Individual Time: 1000-1100 OT Individual Time Calculation (min): 60 min     Short Term Goals: Week 3:  OT Short Term Goal 1 (Week 3): Pt will complete toilet transfers with min assist and LRAD OT Short Term Goal 2 (Week 3): Pt will visual scan environment to locate 3 items at midline with min verbal prompts OT Short Term Goal 3 (Week 3): Pt will complete LB dressing with min assist and min verbal cues for sequencing (except for shoes) OT Short Term Goal 4 (Week 3): Pt will complete bathing with min assist and min verbal cues for sequencing  Skilled Therapeutic Interventions/Progress Updates:    Treatment session with focus on family education in preparation for d/c home.  Pt received upright in w/c with two sons present.  Educated on Lt field cut and visual impairments impeding safety with mobility.  Educated on recommendation for hand held assist with ambulation and standing on pt's Lt to increase scanning to Lt and provide increased safety due to decreased visual scanning and attention.  Ambulated to toilet with min assist.  Pt completed toileting with setup to obtain toilet paper and leaning to complete hygiene.  Wife arrived while pt on toilet.  Ambulated to room shower with min assist and mod cues for safety and sequencing during transfer.  Pt demonstrated improved initiation and sequencing with bathing, even utilizing lateral leans for hygiene and then sit > stand to wash buttocks in standing.  Dressing completed at sit > stand level from w/c with pt donning shirt with setup of shirt for orientation and to locate arm hole.  Required assist to don pants and shoes with cues to cross leg over opposite knee to increase success due to impaired vision.  Educated wife on recommendation for pt to attempt tasks and encourage increased participation prior to  completing tasks for him.  Completed tub/shower transfer in ADL apt with education on use of tub transfer bench for improved safety during transfer and during bathing.  Pt's wife able to provide cues for sequencing with transfer and provided min assist during transfer and ambulation.  Discussed recommended DME and follow up therapy to continue to decrease burden of care.  Pt's wife and sons verbalize understanding.  Therapy Documentation Precautions:  Precautions Precautions: Fall Precaution Comments: Swallowing precautions Restrictions Weight Bearing Restrictions: No General:   Vital Signs: Therapy Vitals BP: 131/80 Pain:  Pt with no c/o pain  See Function Navigator for Current Functional Status.   Therapy/Group: Individual Therapy  Rosalio LoudHOXIE, Saban Heinlen 05/16/2016, 12:23 PM

## 2016-05-16 NOTE — Progress Notes (Addendum)
Physical Therapy Discharge Summary  Patient Details  Name: Ernest [name] MRN: [id number] Date of Birth: 07/03/1956  Today's Date: 05/16/2016 PT Individual Time: 1300-1335 PT Individual Time Calculation (min): 35 min    Patient has met 8 of 10 long term goals due to improved activity tolerance, improved balance, improved postural control, increased strength, decreased pain, ability to compensate for deficits, functional use of  left upper extremity and left lower extremity, improved attention, improved awareness and improved coordination.  Patient to discharge at an ambulatory level Min Assist.   Patient's care partner is independent to provide the necessary physical and cognitive assistance at discharge.  Reasons goals not met: Patient requires min A for bed mobility and max A for emergent awareness.   Recommendation:  Patient will benefit from ongoing skilled PT services in home health setting to continue to advance safe functional mobility, address ongoing impairments in cognition, L hemiparesis and impaired motor planning and motor control, impaired postural control, balance, gait, visual deficits, and minimize fall risk.  Equipment: RW, basic wheelchair cushion  Reasons for discharge: treatment goals met and discharge from hospital  Patient/family agrees with progress made and goals achieved: Yes  PT Discharge Precautions/RestrictionsPrecautions Precautions: Fall Precaution Comments: L field cut Restrictions Weight Bearing Restrictions: No Pain Pain Assessment Pain Assessment: No/denies pain Vision/Perception   L visual field cut, L neglect Sensation Sensation Light Touch Impaired Details: Impaired LUE;Impaired LLE Proprioception Impaired Details: Impaired LUE;Impaired LLE Coordination Gross Motor Movements are Fluid and Coordinated: No Fine Motor Movements are Fluid and Coordinated: No Coordination and Movement Description: LUE apraxia Motor  Motor Motor:  Hemiplegia;Abnormal tone;Motor apraxia Mobility Bed Mobility Bed Mobility: Supine to Sit;Sit to Supine Supine to Sit: 4: Min assist;HOB flat Sit to Supine: 4: Min assist;HOB flat Transfers Transfers: Yes Sit to Stand: 4: Min assist;4: Min guard;5: Supervision;With upper extremity assist Stand to Sit: 5: Supervision;4: Min guard;With upper extremity assist Locomotion  Ambulation Ambulation: Yes Ambulation/Gait Assistance: 4: Min guard;4: Min assist Ambulation Distance (Feet): 150 Feet Assistive device: Rolling walker Gait Gait: Yes Gait Pattern: Impaired Gait Pattern: Trunk flexed;Decreased trunk rotation;Step-through pattern;Decreased stride length Gait velocity: 10 MWT = 0.32 m/s Stairs / Additional Locomotion Stairs: Yes Stairs Assistance: 4: Min guard;4: Min assist Stair Management Technique: Two rails;Step to pattern;Forwards Number of Stairs: 12 Height of Stairs: 3 (8 3", 4 6") Ramp: 4: Min assist Curb: 4: Min assist Wheelchair Mobility Wheelchair Mobility: No  Balance Balance Balance Assessed: Yes Static Sitting Balance Static Sitting - Level of Assistance: 5: Stand by assistance Dynamic Sitting Balance Dynamic Sitting - Level of Assistance: 5: Stand by assistance Static Standing Balance Static Standing - Level of Assistance: 4: Min assist Dynamic Standing Balance Dynamic Standing - Level of Assistance: 4: Min assist Extremity Assessment  RLE Assessment RLE Assessment: Within Functional Limits LLE Assessment LLE Assessment: Within Functional Limits   See Function Navigator for Current Functional Status.  Varner, Rebecca A 05/16/2016, 1:56 PM   

## 2016-05-16 NOTE — Plan of Care (Signed)
Problem: RH Bed Mobility Goal: LTG Patient will perform bed mobility with assist (PT) LTG: Patient will perform bed mobility with assistance, with/without cues (PT).  Outcome: Adequate for Discharge Patient requires min A for bed mobility on regular bed.

## 2016-05-16 NOTE — Plan of Care (Signed)
Problem: RH Awareness Goal: LTG: Patient will demonstrate intellectual/emergent (PT) LTG: Patient will demonstrate intellectual/emergent/anticipatory awareness with assist during a mobility activity  (PT)  Outcome: Adequate for Discharge Patient requires max-total A for emergent awareness.

## 2016-05-17 ENCOUNTER — Telehealth: Payer: Self-pay | Admitting: *Deleted

## 2016-05-17 NOTE — Telephone Encounter (Signed)
1st attempt to reach patient, 05/16/16 @1 :20. I spoke with [place] but [facility]. I told her I will call back later when it is more convenient to talk.   Transitional Care Questions   1. Are you/is patient experiencing any problems since coming home? Are there any questions regarding any aspect of care? 2. Are there any questions regarding medications administration/dosing? Are meds being taken as prescribed? Patient should review meds with caller to confirm 3. Have there been any falls? 4. Has Home Health been to the house and/or have they contacted you? If not, have you tried to contact them? Can we help you contact them? 5. Are bowels and bladder emptying properly? Are there any unexpected incontinence issues? If applicable, is patient following bowel/bladder programs? 6. Any fevers, problems with breathing, unexpected pain? 7. Are there any skin problems or new areas of breakdown? 8. Has the patient/family member arranged specialty MD follow up (ie cardiology/neurology/renal/surgical/etc)?  Can we help arrange? 9. Does the patient need any other services or support that we can help arrange? 10. Are caregivers following through as expected in assisting the patient? Has the patient quit smoking, drinking alcohol, or using drugs as recommended?   Appointment with Dr Wynn BankerKirsteins  05/24/16 @ 3:00, arrive by 2:45

## 2016-05-17 NOTE — Progress Notes (Signed)
Social Work Patient ID: Ernest [name], male   DOB: 20-Jun-1956, 60 y.o.   MRN: [id number]   Nigel SloopJennifer C Autrey Human, [name] Social Worker Signed   Patient Care Conference Date of Service: 05/08/2016 12:12 PM      Hide copied text Hover for attribution information Inpatient RehabilitationTeam Conference and Plan of Care Update Date: 05/09/2016   Time: 11:50 AM      Patient Name: Ernest [name]      Medical Record Number: [id number]  Date of Birth: 20-Jun-1956 Sex: Male         Room/Bed: 4W15C/4W15C-01 Payor Info: Payor: MEDICARE / Plan: MEDICARE PART A AND B / Product Type: *No Product type* /     Admitting Diagnosis: RT ICH  Admit Date/Time:  04/26/2016  2:24 PM Admission Comments: No comment available    Primary Diagnosis:  Nontraumatic cortical hemorrhage of right cerebral hemisphere [facility](HCC) Principal Problem: Nontraumatic cortical hemorrhage of right cerebral hemisphere [facility](HCC)       Patient Active Problem List    Diagnosis Date Noted  . Adjustment disorder with depressed mood    . Other hypervolemia    . 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 Abscess of right arm 08/18/2013  . Essential hypertension 08/18/2013  . Diabetes mellitus type 2 in nonobese (HCC) 08/18/2013  . Thrombocytopenia, unspecified (HCC) 08/18/2013      Expected Discharge Date: Expected Discharge Date: 05/17/16   Team Members Present: Physician leading conference: Dr. Claudette LawsAndrew Kirsteins Social Worker Present: Dossie DerBecky DuPree, [name] Nurse Present: Other (comment) Romie Jumper(Chelsey Evans-RN) PT Present: Aleda GranaVictoria Miller, PT OT Present: Roney MansJennifer Smith, Suszanne ConnersT;Sarah Hoxie, OT SLP Present: Jackalyn LombardNicole Page, SLP PPS Coordinator present : Tora DuckMarie Noel, RN, CRRN       Current Status/Progress Goal Weekly Team Focus  Medical   Right ICH, poor awareness of deficits, severe cognitive deficits with perseveration. Headaches have improved, , with severe left neglect  Reduce left  neglect, improved awareness of deficits  Address mood issues, have adjusted Cymbalta   Bowel/Bladder   Pt continent and incontinent at times LBM 05/06/16  mod assist  cont. to monitor q shift   Swallow/Nutrition/ Hydration   Dys 2, thin liquids   supervision   continue working towards trials of upgraded textures per improved mentation, family education    ADL's   min/mod assist transfers, mod assist bathing and dressing due to impaired vision and decreased attention  Min A transfers, toileting, LB dressing, standing; Supervision sitting, UB dressing, grooming  attention to task, visual attention/scanning, safety awareness, functional transfers, ADL retraining   Mobility   Min-mod A overall  Min A due to visual deficits  cognition, attention, visual scanning, transfer and gait training, balance   Communication             Safety/Cognition/ Behavioral Observations severe cognitive deficits, limited this reporting period by perseveration on depression, max to total assist for basic tasks due to visuperceptual deficits   min assist, may need to be downgraded   continue to address basic cognition    Pain   c/o headaches at times  pain less than or equal to 3  continue to monitor q shift   Skin   abraisions to penis; BLE 2 +  no new skin breakdown this admission  continue to monitor q shift     Rehab Goals Patient on target to meet rehab goals: Yes Rehab Goals Revised: w/c goals have been d/c'd due to visual deficits  *See  Care Plan and progress notes for long and short-term goals.   Barriers to Discharge: See above, severe cognitive and visual spatial awareness issues   Possible Resolutions to Barriers:  Continue rehabilitation   Discharge Planning/Teaching Needs:  Pt plans to return home with his wife, sons, and sister-in-law/brother-in-law to assist.  Pt's family will come in for family education closer to d/c.   Team Discussion:  Progressing toward his goals of min assist level. Continues  to need to be re-directed and perseverating on his depression. Attention poor-MD increased his ritalin dose. Timed toileitng to help with continence. Can not get to a higher level due to attention and visual issues. Will need to do family education with wife.  Revisions to Treatment Plan:  None    Continued Need for Acute Rehabilitation Level of Care: The patient requires daily medical management by a physician with specialized training in physical medicine and rehabilitation for the following conditions: Daily direction of a multidisciplinary physical rehabilitation program to ensure safe treatment while eliciting the highest outcome that is of practical value to the patient.: Yes Daily medical management of patient stability for increased activity during participation in an intensive rehabilitation regime.: Yes Daily analysis of laboratory values and/or radiology reports with any subsequent need for medication adjustment of medical intervention for : Neurological problems;Blood pressure problems   David Rodriquez, Vista DeckJennifer Capps 05/09/2016, 11:50 AM     Revision History   Date/Time User Provider Type Action  05/09/2016 11:50 AM Nigel SloopJennifer C Rio Taber, [name] Social Worker Sign  05/08/2016 12:17 PM Lucy Chrisebecca G Dupree, [name] Social Worker [employer]  View Details Report

## 2016-05-17 NOTE — Progress Notes (Signed)
Social Work Patient ID: Ernest [name], male   DOB: 1956/01/19, 60 y.o.   MRN: [id number]   CSW met with pt and later talked with pt's wife over the telephone to update them on team conference discussion and to assist with d/c planning.  Pt's wife is agreeable to pt d/c'ing after family education on 05-16-16.  She was pleased to hear that pt was likely agitated on Tuesday due to medication interaction.  She feels prepared for pt to come home and pt's sons will come with her for family education on 05-16-16.  CSW set up home health for pt and obtained DME.  Pt is relieved to be going home.  CSW remains available to assist as needed.

## 2016-05-17 NOTE — Progress Notes (Signed)
Social Work Patient ID: Ernest EagleWillie [name], male   DOB: 08-24-1956, 60 y.o.   MRN: [id number]    Ernest SloopJennifer C Avrum Kimball, [name] Social Worker Signed   Patient Care Conference Date of Service: 05/16/2016  2:13 PM      Hide copied text Hover for attribution information Inpatient RehabilitationTeam Conference and Plan of Care Update Date: 05/15/2016   Time: 11:00 AM      Patient Name: Ernest [name]      Medical Record Number: [id number]  Date of Birth: 08-24-1956 Sex: Male         Room/Bed: 4W15C/4W15C-01 Payor Info: Payor: MEDICARE / Plan: MEDICARE PART A AND B / Product Type: *No Product type* /     Admitting Diagnosis: RT ICH  Admit Date/Time:  04/26/2016  2:24 PM Admission Comments: No comment available    Primary Diagnosis:  Nontraumatic cortical hemorrhage of right cerebral hemisphere [facility](HCC) Principal Problem: Nontraumatic cortical hemorrhage of right cerebral hemisphere [facility](HCC)       Patient Active Problem List    Diagnosis Date Noted  . UTI (urinary tract infection), uncomplicated 05/13/2016  . Adjustment disorder with depressed mood    . Other hypervolemia    . 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 Abscess of right arm 08/18/2013  . Essential hypertension 08/18/2013  . Diabetes mellitus type 2 in nonobese (HCC) 08/18/2013  . Thrombocytopenia, unspecified (HCC) 08/18/2013      Expected Discharge Date: Expected Discharge Date: 05/16/16 (after therapies/family education)   Team Members Present: Physician leading conference: Dr. Claudette LawsAndrew [name] Social Worker Present: Ernest AcostaJenny Kashtyn Jankowski, [name] PT Present: Ernest [name], PT OT Present: Ernest [name], OT SLP Present: Ernest [name], SLP PPS Coordinator present : Ernest DuckMarie Noel, RN, CRRN       Current Status/Progress Goal Weekly Team Focus  Medical   episode of agitation requiring antipsychotic meds, medical w/u negative  maintain psychiatric stability during hospitalization   med adjustments off ritalin, still on cymbalta   Bowel/Bladder   since yesterday, patient very uncooperative & combative with care, incontinence has increase  Patient will be able to toilet with min assist  Will continue to monitor q shift & offer assistance    Swallow/Nutrition/ Hydration   Dys 2, thin liquids due to cognition  supervision   family education    ADL's   min-mod assist overall due to visual impairments and decreased attention  Min assist overall  attention to task, visual attention/scanning, safety awareness, functional transfers, ADL retraining, family education   Mobility   Supervision-min A overall before changes in mental status  Min A due to visual deficits  family education, D/C planning   Communication             Safety/Cognition/ Behavioral Observations continues with severe cognitive deficits, remains limited by profound perseveration, visual impairment, and sustained attention to tasks   mod-max assist; downgraded due to slow progress   family education    Pain   has tylenol 650mg  prn, last used 05/02/16  pain scale less than 3  conitnue to assess & treat as needed   Skin   No issues noted  no new areas of skin breakdown  continue to assess skin q shift when allowed     Rehab Goals Patient on target to meet rehab goals: Yes Rehab Goals Revised: Dressing goals downgraded *See Care Plan and progress notes for long and short-term goals.   Barriers to Discharge: severe cognitive and visuospatial impairments  Possible Resolutions to Barriers:  d/c planning, will avoid excess serotonin   Discharge Planning/Teaching Needs:  Pt plans to return home with his wife, sons, and sister-in-law/brother-in-law to assist.  Pt's wife is also thinking about hiring private duty caregivers while she is at dialysis.  Pt's family will come in for family education closer to d/c.   Team Discussion:  Pt with agitation on Tuesday, which is different for pt.  This was also the day pt's  family was present for family education.  Pt's tests/exams were all negative and Dr. Wynn [name] feels it was due to medications interacting with one another, so he's made adjustments, including d/c Ritalin and Haldol and decreasing Cymbalta.  Pt was more agreeable by day of conference and was very appreciative of therapists.  PT to practice the car tx again. Pt is on D2 diet and shoves food in without cueing. SLP to discuss this with family prior to d/c.   Revisions to Treatment Plan:  Pt to d/c after therapies on 05-16-16 instead of 05-17-16.    Continued Need for Acute Rehabilitation Level of Care: The patient requires daily medical management by a physician with specialized training in physical medicine and rehabilitation for the following conditions: Daily analysis of laboratory values and/or radiology reports with any subsequent need for medication adjustment of medical intervention for : Mood/behavior problems;Neurological problems   Ernest [name], Ernest [name] 05/17/2016, 1:24 AM

## 2016-05-17 NOTE — Progress Notes (Signed)
Social Work Discharge Note  The overall goal for the admission was met for:   Discharge location: Yes - home  Length of Stay: Yes - 20 days  Discharge activity level: Yes - minimal assistance  Home/community participation: Yes  Services provided included: MD, RD, PT, OT, SLP, RN, Pharmacy, Neuropsych and SW  Financial Services: Medicare and Other: [employer] benefits  Follow-up services arranged: Home Health: PT/OT/RN/SLP/bath aide , DME: 18"x16" basic cushion; rolling walker; and 3-in-1 and Patient/Family request agency HH: [facility], DME: [facility]  Comments (or additional information): Pt to d/c with his wife and sons to provide minimal assistance.  Pt very appreciative to be going home.  Pt's family received education.  CSW arranged HH and DME for pt.  Pt's family to borrow a tub bench from family member.  Pt to f/u at [facility] for post hospital visit when convenient.  Patient/Family verbalized understanding of follow-up arrangements: Yes  Individual responsible for coordination of the follow-up plan: pt's wife  Confirmed correct DME delivered: Trey Sailors 05/17/2016    Apple Dearmas, Silvestre Mesi

## 2016-05-17 NOTE — Patient Care Conference (Signed)
Inpatient RehabilitationTeam Conference and Plan of Care Update Date: 05/15/2016   Time: 11:00 AM    Patient Name: Ernest [name]      Medical Record Number: [id number]  Date of Birth: October 06, 1955 Sex: Male         Room/Bed: 4W15C/4W15C-01 Payor Info: Payor: MEDICARE / Plan: MEDICARE PART A AND B / Product Type: *No Product type* /    Admitting Diagnosis: RT ICH  Admit Date/Time:  04/26/2016  2:24 PM Admission Comments: No comment available   Primary Diagnosis:  Nontraumatic cortical hemorrhage of right cerebral hemisphere [facility](HCC) Principal Problem: Nontraumatic cortical hemorrhage of right cerebral hemisphere [facility](HCC)  Patient Active Problem List   Diagnosis Date Noted  . UTI (urinary tract infection), uncomplicated 05/13/2016  . Adjustment disorder with depressed mood   . Other hypervolemia   . 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 Abscess of right arm 08/18/2013  . Essential hypertension 08/18/2013  . Diabetes mellitus type 2 in nonobese (HCC) 08/18/2013  . Thrombocytopenia, unspecified (HCC) 08/18/2013    Expected Discharge Date: Expected Discharge Date: 05/16/16 (after therapies/family education)  Team Members Present: Physician leading conference: Dr. Claudette LawsAndrew Kirsteins Social Worker Present: Staci AcostaJenny Tkai Serfass, [name] PT Present: Edman CircleAudra Hall, PT OT Present: Rosalio LoudSarah Hoxie, OT SLP Present: Jackalyn LombardNicole Page, SLP PPS Coordinator present : Tora DuckMarie Noel, RN, CRRN     Current Status/Progress Goal Weekly Team Focus  Medical   episode of agitation requiring antipsychotic meds, medical w/u negative  maintain psychiatric stability during hospitalization  med adjustments off ritalin, still on cymbalta   Bowel/Bladder   since yesterday, patient very uncooperative & combative with care, incontinence has increase  Patient will be able to toilet with min assist  Will continue to monitor q shift & offer assistance     Swallow/Nutrition/ Hydration   Dys 2, thin liquids due to cognition  supervision   family education    ADL's   min-mod assist overall due to visual impairments and decreased attention  Min assist overall  attention to task, visual attention/scanning, safety awareness, functional transfers, ADL retraining, family education   Mobility   Supervision-min A overall before changes in mental status  Min A due to visual deficits  family education, D/C planning   Communication             Safety/Cognition/ Behavioral Observations  continues with severe cognitive deficits, remains limited by profound perseveration, visual impairment, and sustained attention to tasks   mod-max assist; downgraded due to slow progress   family education    Pain   has tylenol 650mg  prn, last used 05/02/16  pain scale less than 3  conitnue to assess & treat as needed   Skin   No issues noted  no new areas of skin breakdown  continue to assess skin q shift when allowed    Rehab Goals Patient on target to meet rehab goals: Yes Rehab Goals Revised: Dressing goals downgraded *See Care Plan and progress notes for long and short-term goals.  Barriers to Discharge: severe cognitive and visuospatial impairments    Possible Resolutions to Barriers:  d/c planning, will avoid excess serotonin    Discharge Planning/Teaching Needs:  Pt plans to return home with his wife, sons, and sister-in-law/brother-in-law to assist.  Pt's wife is also thinking about hiring private duty caregivers while she is at dialysis.  Pt's family will come in for family education closer to d/c.   Team Discussion:  Pt with agitation on  Tuesday, which is different for pt.  This was also the day pt's family was present for family education.  Pt's tests/exams were all negative and Dr. Wynn BankerKirsteins feels it was due to medications interacting with one another, so he's made adjustments, including d/c Ritalin and Haldol and decreasing Cymbalta.  Pt was more  agreeable by day of conference and was very appreciative of therapists.  PT to practice the car tx again. Pt is on D2 diet and shoves food in without cueing. SLP to discuss this with family prior to d/c.   Revisions to Treatment Plan:  Pt to d/c after therapies on 05-16-16 instead of 05-17-16.   Continued Need for Acute Rehabilitation Level of Care: The patient requires daily medical management by a physician with specialized training in physical medicine and rehabilitation for the following conditions: Daily analysis of laboratory values and/or radiology reports with any subsequent need for medication adjustment of medical intervention for : Mood/behavior problems;Neurological problems  Boruch Manuele, Vista DeckJennifer Capps 05/17/2016, 1:24 AM

## 2016-05-24 ENCOUNTER — Telehealth: Payer: Self-pay | Admitting: Physical Medicine & Rehabilitation

## 2016-05-24 ENCOUNTER — Inpatient Hospital Stay: Payer: Medicare Other | Admitting: Physical Medicine & Rehabilitation

## 2016-05-24 ENCOUNTER — Encounter: Payer: Medicare Other | Attending: Physical Medicine & Rehabilitation

## 2016-05-24 NOTE — Telephone Encounter (Signed)
Kathryne SharperKatie Green Rn with [facility] needed to let Dr. Wynn BankerKirsteins know that patients Blood Pressure sitting was 180/96 and 154/82 standing.  He just took his medication at the visit with her this afternoon.  They are keeping a log of blood pressure and wife is checking it.  Any questions please call her at [phone].

## 2016-05-24 NOTE — Telephone Encounter (Signed)
FYI

## 2016-06-04 ENCOUNTER — Encounter (HOSPITAL_BASED_OUTPATIENT_CLINIC_OR_DEPARTMENT_OTHER): Payer: Self-pay | Admitting: *Deleted

## 2016-06-04 ENCOUNTER — Emergency Department (HOSPITAL_BASED_OUTPATIENT_CLINIC_OR_DEPARTMENT_OTHER): Payer: Medicare Other

## 2016-06-04 ENCOUNTER — Emergency Department (HOSPITAL_BASED_OUTPATIENT_CLINIC_OR_DEPARTMENT_OTHER)
Admission: EM | Admit: 2016-06-04 | Discharge: 2016-06-05 | Disposition: A | Discharge status: Home / Self Care | Payer: Medicare Other | Attending: Emergency Medicine | Admitting: Emergency Medicine

## 2016-06-04 ENCOUNTER — Emergency Department (HOSPITAL_COMMUNITY): Payer: Medicare Other

## 2016-06-04 DIAGNOSIS — Z794 Long term (current) use of insulin: Secondary | ICD-10-CM | POA: Diagnosis not present

## 2016-06-04 DIAGNOSIS — I69198 Other sequelae of nontraumatic intracerebral hemorrhage: Secondary | ICD-10-CM | POA: Insufficient documentation

## 2016-06-04 DIAGNOSIS — H547 Unspecified visual loss: Secondary | ICD-10-CM | POA: Insufficient documentation

## 2016-06-04 DIAGNOSIS — I509 Heart failure, unspecified: Secondary | ICD-10-CM | POA: Diagnosis not present

## 2016-06-04 DIAGNOSIS — F17200 Nicotine dependence, unspecified, uncomplicated: Secondary | ICD-10-CM | POA: Insufficient documentation

## 2016-06-04 DIAGNOSIS — G40909 Epilepsy, unspecified, not intractable, without status epilepticus: Secondary | ICD-10-CM | POA: Diagnosis not present

## 2016-06-04 DIAGNOSIS — E119 Type 2 diabetes mellitus without complications: Secondary | ICD-10-CM | POA: Insufficient documentation

## 2016-06-04 DIAGNOSIS — I110 Hypertensive heart disease with heart failure: Secondary | ICD-10-CM | POA: Insufficient documentation

## 2016-06-04 DIAGNOSIS — Z79899 Other long term (current) drug therapy: Secondary | ICD-10-CM | POA: Insufficient documentation

## 2016-06-04 DIAGNOSIS — I69192 Facial weakness following nontraumatic intracerebral hemorrhage: Secondary | ICD-10-CM | POA: Insufficient documentation

## 2016-06-04 LAB — COMPREHENSIVE METABOLIC PANEL
ALBUMIN: 3.4 g/dL — AB (ref 3.5–5.0)
ALT: 25 U/L (ref 17–63)
AST: 36 U/L (ref 15–41)
Alkaline Phosphatase: 118 U/L (ref 38–126)
Anion gap: 12 (ref 5–15)
BILIRUBIN TOTAL: 1.2 mg/dL (ref 0.3–1.2)
BUN: 19 mg/dL (ref 6–20)
CO2: 24 mmol/L (ref 22–32)
Calcium: 9.2 mg/dL (ref 8.9–10.3)
Chloride: 101 mmol/L (ref 101–111)
Creatinine, Ser: 1.36 mg/dL — ABNORMAL HIGH (ref 0.61–1.24)
GFR calc Af Amer: >60 mL/min (ref >60)
GFR calc non Af Amer: 55 mL/min — ABNORMAL LOW (ref >60)
GLUCOSE: 173 mg/dL — AB (ref 65–99)
POTASSIUM: 3.4 mmol/L — AB (ref 3.5–5.1)
SODIUM: 137 mmol/L (ref 135–145)
TOTAL PROTEIN: 7.9 g/dL (ref 6.5–8.1)

## 2016-06-04 LAB — CBC WITH DIFFERENTIAL/PLATELET
BASOS ABS: 0 10*3/uL (ref 0.0–0.1)
BASOS PCT: 1 %
Eosinophils Absolute: 0.1 10*3/uL (ref 0.0–0.7)
Eosinophils Relative: 1 %
HEMATOCRIT: 38.1 % — AB (ref 39.0–52.0)
HEMOGLOBIN: 13.2 g/dL (ref 13.0–17.0)
Lymphocytes Relative: 41 %
Lymphs Abs: 2.1 10*3/uL (ref 0.7–4.0)
MCH: 29.1 pg (ref 26.0–34.0)
MCHC: 34.6 g/dL (ref 30.0–36.0)
MCV: 84.1 fL (ref 78.0–100.0)
MONO ABS: 0.6 10*3/uL (ref 0.1–1.0)
Monocytes Relative: 12 %
NEUTROS ABS: 2.4 10*3/uL (ref 1.7–7.7)
NEUTROS PCT: 45 %
Platelets: 136 10*3/uL — ABNORMAL LOW (ref 150–400)
RBC: 4.53 MIL/uL (ref 4.22–5.81)
RDW: 12 % (ref 11.5–15.5)
WBC: 5.2 10*3/uL (ref 4.0–10.5)

## 2016-06-04 LAB — TROPONIN I: Troponin I: <0.03 ng/mL (ref <0.03)

## 2016-06-04 LAB — CBG MONITORING, ED: GLUCOSE-CAPILLARY: 189 mg/dL — AB (ref 65–99)

## 2016-06-04 MED ORDER — IOPAMIDOL (ISOVUE-370) INJECTION 76%
100.0000 mL | Freq: Once | INTRAVENOUS | Status: AC | PRN
  Administered 2016-06-04: 100 mL via INTRAVENOUS

## 2016-06-04 NOTE — ED Provider Notes (Signed)
[facility] Provider Note   CSN: [id number] Arrival date & time: 06/04/16  1631     History   Chief Complaint Chief Complaint  Patient presents with  . Loss of Vision    HPI Pruitt Taboada is a 60 y.o. male.  HPI  Pt comes in with cc of vision loss. Pt has hx of DM, CHF, recent hemorrhagic stroke, he was discharged from the hospital in August. Pt reports that he was last at his baseline normal last night. He didn't sleep that well last night and was having headache and neck pain. This morning he noted that he wasn't seeing as well. Pt is a poor historian, and it;s hard ti discern how good his baseline vision is post stroke. Wife reports that his vision was fine accept for the peripheral vision on BOTH sides. Pt report that he "could see" from the L eye before, but now he cannot.  Pt was admitted to to [facility] on 04/08/16 with left facial droop and left sided weakness due to large acute hemorrhage involving right parietal and temporal region. He had required craniectomy and eventually transferred to [place]. Our discharge summary states that pt had a R sided gaze and L sided hemineglect.  Past Medical History:  Diagnosis Date  . CHF (congestive heart failure) (HCC)   . CVA (cerebral infarction)   . Diabetes mellitus without complication (HCC)   . GERD (gastroesophageal reflux disease)   . Hypertension   . OSA (obstructive sleep apnea)   . Seizure disorder [facility])     Patient Active Problem List   Diagnosis Date Noted  . UTI (urinary tract infection), uncomplicated 05/13/2016  . Adjustment disorder with depressed mood   . Other hypervolemia   . 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 Abscess of right arm 08/18/2013  . Essential hypertension 08/18/2013  . Diabetes mellitus type 2 in nonobese (HCC) 08/18/2013  . Thrombocytopenia, unspecified (HCC) 08/18/2013    Past Surgical  History:  Procedure Laterality Date  . APPLICATION OF WOUND VAC Right 08/20/2013   Procedure: VAC ASSISTED CLOSURE FOREARM;  Surgeon: Dominica Severin, MD;  Location: [facility] OR;  Service: Orthopedics;  Laterality: Right;  . I&D EXTREMITY Right 08/18/2013   Procedure: IRRIGATION AND DEBRIDEMENT EXTREMITY;  Surgeon: Dominica Severin, MD;  Location: [facility] OR;  Service: Orthopedics;  Laterality: Right;  . I&D EXTREMITY Right 08/20/2013   Procedure: IRRIGATION AND DEBRIDEMENT WASHOUT ARM;  Surgeon: Dominica Severin, MD;  Location: [facility] OR;  Service: Orthopedics;  Laterality: Right;  . I&D EXTREMITY Right 08/22/2013   Procedure: IRRIGATION AND DEBRIDEMENT FOREARM COMPLEX WITH WOUND CLOSURE;  Surgeon: Dominica Severin, MD;  Location: [facility] OR;  Service: Orthopedics;  Laterality: Right;       Home Medications    Prior to Admission medications   Medication Sig Start Date End Date Taking? Authorizing Provider  amLODipine (NORVASC) 10 MG tablet Take 0.5 tablets (5 mg total) by mouth daily. 05/16/16  Yes Evlyn Kanner Love, PA-C  cholecalciferol 1000 units tablet Take 1 tablet (1,000 Units total) by mouth daily. 05/16/16  Yes Evlyn Kanner Love, PA-C  cloNIDine (CATAPRES) 0.3 MG tablet Take 1 tablet (0.3 mg total) by mouth 3 (three) times daily. 05/16/16  Yes Evlyn Kanner Love, PA-C  cyclobenzaprine (FLEXERIL) 5 MG tablet Take 1 tablet (5 mg total) by mouth at bedtime. 05/16/16  Yes Evlyn Kanner Love, PA-C  DULoxetine (CYMBALTA) 30 MG capsule Take 1 capsule (30  mg total) by mouth daily. 05/16/16  Yes Evlyn KannerPamela S Love, PA-C  folic acid (FOLVITE) 1 MG tablet Take 1 tablet (1 mg total) by mouth daily. 05/16/16  Yes Evlyn KannerPamela S Love, PA-C  furosemide (LASIX) 40 MG tablet Take 1 tablet (40 mg total) by mouth 2 (two) times daily. 05/16/16  Yes Evlyn KannerPamela S Love, PA-C  hydrALAZINE (APRESOLINE) 100 MG tablet Take 1 tablet (100 mg total) by mouth 4 (four) times daily -  with meals and at bedtime. 05/16/16  Yes Evlyn KannerPamela S Love, PA-C  insulin detemir (LEVEMIR) 100  UNIT/ML injection Inject 0.12 mLs (12 Units total) into the skin 2 (two) times daily. 05/16/16  Yes Evlyn KannerPamela S Love, PA-C  levETIRAcetam (KEPPRA) 500 MG tablet Take 1 tablet (500 mg total) by mouth 2 (two) times daily. 05/16/16  Yes Evlyn KannerPamela S Love, PA-C  lisinopril (PRINIVIL,ZESTRIL) 40 MG tablet Take 1 tablet (40 mg total) by mouth daily. 05/16/16  Yes Evlyn KannerPamela S Love, PA-C  Multiple Vitamins-Minerals (MULTIVITAMIN WITH MINERALS) tablet Take 1 tablet by mouth daily.   Yes Historical Provider, MD  omeprazole (PRILOSEC) 20 MG capsule Take 40 mg by mouth daily.   Yes Historical Provider, MD  polyethylene glycol (MIRALAX / GLYCOLAX) packet Take 17 g by mouth daily. 05/16/16  Yes Evlyn KannerPamela S Love, PA-C  potassium chloride SA (K-DUR,KLOR-CON) 20 MEQ tablet Take 2 tablets (40 mEq total) by mouth 3 (three) times daily. 05/16/16  Yes Evlyn KannerPamela S Love, PA-C  tamsulosin (FLOMAX) 0.4 MG CAPS capsule Take 1 capsule (0.4 mg total) by mouth daily after supper. 05/16/16  Yes Evlyn KannerPamela S Love, PA-C  thiamine (VITAMIN B-1) 100 MG tablet Take 100 mg by mouth daily.   Yes Historical Provider, MD    Family History No family history on file.  Social History Social History  Substance Use Topics  . Smoking status: Current Every Day Smoker  . Smokeless tobacco: Not on file  . Alcohol use No     Allergies   Shellfish allergy and Cymbalta [duloxetine hcl]   Review of Systems Review of Systems  ROS 10 Systems reviewed and are negative for acute change except as noted in the HPI.     Physical Exam Updated Vital Signs BP 156/91 (BP Location: Left Arm)   Pulse 98   Temp 98 F (36.7 C) (Oral)   Resp 20   Ht 5\' 9"  (1.753 m)   Wt 197 lb (89.4 kg)   SpO2 99%   BMI 29.09 kg/m   Physical Exam  Constitutional: He appears well-developed and well-nourished.  HENT:  Head: Normocephalic and atraumatic.  Eyes: Pupils are equal, round, and reactive to light.  R sided gaze  Neck: Normal range of motion. Neck supple.    Cardiovascular: Normal rate and regular rhythm.   Pulmonary/Chest: Effort normal and breath sounds normal. No respiratory distress. He has no wheezes.  Abdominal: Soft. Bowel sounds are normal. He exhibits no distension. There is no tenderness. There is no rebound and no guarding.  Neurological: He is alert. No cranial nerve deficit. Coordination normal.  NIHSS - 6  1 for question 1 for gaze 2 for visual field - hemianopia 1 for facial palsy 1 for some visual extinction   Skin: Skin is warm and dry.  Nursing note and vitals reviewed.    ED Treatments / Results  Labs (all labs ordered are listed, but only abnormal results are displayed) Labs Reviewed  CBC WITH DIFFERENTIAL/PLATELET - Abnormal; Notable for the following:  Result Value   HCT 38.1 (*)    Platelets 136 (*)    All other components within normal limits  COMPREHENSIVE METABOLIC PANEL - Abnormal; Notable for the following:    Potassium 3.4 (*)    Glucose, Bld 173 (*)    Creatinine, Ser 1.36 (*)    Albumin 3.4 (*)    GFR calc non Af Amer 55 (*)    All other components within normal limits  CBG MONITORING, ED - Abnormal; Notable for the following:    Glucose-Capillary 189 (*)    All other components within normal limits  TROPONIN I  CBG MONITORING, ED    EKG  EKG Interpretation  Date/Time:  Tuesday June 04 2016 17:06:38 EDT Ventricular Rate:  96 PR Interval:    QRS Duration: 94 QT Interval:  359 QTC Calculation: 454 R Axis:   45 Text Interpretation:  Sinus rhythm Probable left atrial enlargement Borderline T wave abnormalities Borderline ST elevation, anterior leads No acute changes No significant change since last tracing Confirmed by Rhunette Croft, MD, Janey Genta [phone]) on 06/04/2016 5:24:10 PM       Radiology Ct Angio Head W Or Wo Contrast  Result Date: 06/04/2016 CLINICAL DATA:  Development of left-sided visual loss. History of previous strokes. EXAM: CT ANGIOGRAPHY HEAD AND NECK TECHNIQUE:  Multidetector CT imaging of the head and neck was performed using the standard protocol during bolus administration of intravenous contrast. Multiplanar CT image reconstructions and MIPs were obtained to evaluate the vascular anatomy. Carotid stenosis measurements (when applicable) are obtained utilizing NASCET criteria, using the distal internal carotid diameter as the denominator. CONTRAST:  100 cc Isovue 370 COMPARISON:  Multiple previous CT examinations earlier this year. MRI 09/02/2013. FINDINGS: CTA NECK Aortic arch: Normal Right carotid system: Common carotid artery widely patent to the bifurcation. The carotid bifurcation is normal without atherosclerotic plaque, narrowing or irregularity. Cervical internal carotid artery is widely patent. Left carotid system: Left common carotid artery widely patent to the bifurcation. The carotid bifurcation is normal without atherosclerotic plaque, narrowing or irregularity. Cervical internal carotid artery widely patent. Vertebral arteries:Right vertebral artery dominant. Right vertebral artery origin widely patent. Atherosclerotic calcification of the proximal right vertebral artery less than 1 cm from the origin with stenosis estimated at 70%. Right vertebral artery beyond Susy Frizzle is widely patent through the neck. Left vertebral artery origin is widely patent. This small vessel appears widely patent through the neck. Skeleton: Advanced cervical spondylosis with kyphotic curvature Other neck: No soft tissue lesion identified. Upper chest: Upper lungs are clear. CTA HEAD Anterior circulation: Both internal carotid arteries patent through the skullbase and siphon regions. Some atherosclerotic calcification of the carotid siphons but no flow-limiting stenosis. The anterior and middle cerebral vessels are patent without proximal stenosis, aneurysm or vascular malformation. Posterior circulation: Both vertebral arteries patent at the foramen magnum level. The right vertebral  artery terminates in PICA. There is atherosclerotic disease of the left vertebral artery with stenosis estimated at 50%. This vessel those on an supplies the basilar which shows normal caliber. Superior cerebellar and posterior cerebral vessels are patent bilaterally. Venous sinuses: Patent and normal. Anatomic variants: None significant Delayed phase: Mild enhancement in the region of the evolving encephalomalacia in the right parietal region. IMPRESSION: No carotid disease in the neck. 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 Second 50% stenosis of the right vertebral artery just past the foramen magnum. No intracranial large or medium vessel occlusion or correctable proximal  stenosis. Electronically Signed   By: Paulina Fusi M.D.   On: 06/04/2016 20:06   Ct Head Wo Contrast  Result Date: 06/04/2016 CLINICAL DATA:  Vision loss. Headache. Recent right temporoparietal hemorrhage requiring evacuation. EXAM: CT HEAD WITHOUT CONTRAST TECHNIQUE: Contiguous axial images were obtained from the base of the skull through the vertex without intravenous contrast. COMPARISON:  05/14/2016 head CT. FINDINGS: Brain: There is evolving encephalomalacia in the right temporoparietal lobe with decreased mass-effect on the local sulci in this location. There is chronic encephalomalacia in the left occipital lobe. No evidence of parenchymal hemorrhage or extra-axial fluid collection. No mass lesion, mass effect, or midline shift. No CT evidence of acute infarction. Generalized cerebral volume loss. Intracranial atherosclerosis. Nonspecific stable mild-to-moderate subcortical and periventricular white matter hypodensity, most in keeping with chronic small vessel ischemic change. Ventricles appear stable with ex vacuo dilatation of the left greater than right occipital horns. Vascular: No hyperdense vessel or unexpected calcification. Skull: No evidence of calvarial fracture. Status post right  craniotomy. Sinuses/Orbits: The visualized paranasal sinuses are essentially clear. Other: Stable partial opacification of the right mastoid air cells. Left mastoid air cells are non-opacified. IMPRESSION: 1.  No evidence of acute intracranial abnormality. 2. Evolving right temporoparietal encephalomalacia. Chronic left occipital lobe encephalomalacia. Generalized cerebral volume loss and chronic small vessel ischemia. 3. Stable right mastoid effusion. Electronically Signed   By: Delbert Phenix M.D.   On: 06/04/2016 17:49   Ct Angio Neck W And/or Wo Contrast  Result Date: 06/04/2016 CLINICAL DATA:  Development of left-sided visual loss. History of previous strokes. EXAM: CT ANGIOGRAPHY HEAD AND NECK TECHNIQUE: Multidetector CT imaging of the head and neck was performed using the standard protocol during bolus administration of intravenous contrast. Multiplanar CT image reconstructions and MIPs were obtained to evaluate the vascular anatomy. Carotid stenosis measurements (when applicable) are obtained utilizing NASCET criteria, using the distal internal carotid diameter as the denominator. CONTRAST:  100 cc Isovue 370 COMPARISON:  Multiple previous CT examinations earlier this year. MRI 09/02/2013. FINDINGS: CTA NECK Aortic arch: Normal Right carotid system: Common carotid artery widely patent to the bifurcation. The carotid bifurcation is normal without atherosclerotic plaque, narrowing or irregularity. Cervical internal carotid artery is widely patent. Left carotid system: Left common carotid artery widely patent to the bifurcation. The carotid bifurcation is normal without atherosclerotic plaque, narrowing or irregularity. Cervical internal carotid artery widely patent. Vertebral arteries:Right vertebral artery dominant. Right vertebral artery origin widely patent. Atherosclerotic calcification of the proximal right vertebral artery less than 1 cm from the origin with stenosis estimated at 70%. Right vertebral  artery beyond Susy Frizzle is widely patent through the neck. Left vertebral artery origin is widely patent. This small vessel appears widely patent through the neck. Skeleton: Advanced cervical spondylosis with kyphotic curvature Other neck: No soft tissue lesion identified. Upper chest: Upper lungs are clear. CTA HEAD Anterior circulation: Both internal carotid arteries patent through the skullbase and siphon regions. Some atherosclerotic calcification of the carotid siphons but no flow-limiting stenosis. The anterior and middle cerebral vessels are patent without proximal stenosis, aneurysm or vascular malformation. Posterior circulation: Both vertebral arteries patent at the foramen magnum level. The right vertebral artery terminates in PICA. There is atherosclerotic disease of the left vertebral artery with stenosis estimated at 50%. This vessel those on an supplies the basilar which shows normal caliber. Superior cerebellar and posterior cerebral vessels are patent bilaterally. Venous sinuses: Patent and normal. Anatomic variants: None significant Delayed phase: Mild enhancement in the region  of the evolving encephalomalacia in the right parietal region. IMPRESSION: No carotid disease in the neck. 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 Second 50% stenosis of the right vertebral artery just past the foramen magnum. No intracranial large or medium vessel occlusion or correctable proximal stenosis. Electronically Signed   By: Paulina Fusi M.D.   On: 06/04/2016 20:06    Procedures Procedures (including critical care time)  Medications Ordered in ED Medications  iopamidol (ISOVUE-370) 76 % injection 100 mL (100 mLs Intravenous Contrast Given 06/04/16 1930)     Initial Impression / Assessment and Plan / ED Course  I have reviewed the triage vital signs and the nursing notes.  Pertinent labs & imaging results that were available during my care of the patient were  reviewed by me and considered in my medical decision making (see chart for details).  Clinical Course  Value Comment By Time   Spoke with Dr. Roseanne Reno, Neurology. He is aware that pt had a recent R sided stroke, and now having complete vision loss on the L sided with L sided hemi-neglect. He wants CT-Angio head and neck - if there is no acute occlusion, he would want pt to see an opthalmologist. He doesn't think MRI is indicated if CT angio is neg. Derwood Kaplan, MD 09/05 1906  CT Angio Head W or Wo Contrast CT angio is neg. FOR ANTERIOR CIRCULATION problems. We are awaiting transfer to [facility] for MRI brain. Dr. Patria Mane accepting. If MRI is neg, pt will need outpatient f/u with opthalmologist. Derwood Kaplan, MD 09/05 2035    Pt comes in with vision loss as his CC. He is a poor historian. Pt had a recent hemorrhagic stroke. Based on the previus note, he did have R sided gaze preference and L sided neglect - we appreciate that on our exam, but the L sided neglect was not defined. Pt also c/o new vision loss on the L side.  Neuro recommendation appreciated. CT-angio head and neck ordered and MRi ordered. If no acute occlusion in the  Anterior circulation, or strokes - pt needs to see ophthalmologist as per Neuro request.   Final Clinical Impressions(s) / ED Diagnoses   Final diagnoses:  Vision loss    New Prescriptions New Prescriptions   No medications on file     Derwood Kaplan, MD 06/04/16 2120

## 2016-06-04 NOTE — ED Triage Notes (Signed)
C/o h/a. Pt had surgery, post cva.and had bleeding  on his brain 711/17. Pt had only peripheral vision to right and today he is seeing less than usual.  Pt denies injury today.

## 2016-06-04 NOTE — ED Notes (Signed)
MD at bedside. 

## 2016-06-04 NOTE — ED Notes (Signed)
Wife states pt said this am that "I am tired of living like this, not being able to see. I just want to die". Pt wife states he has not voiced a plan and that he has not mentioned this prior to this am.

## 2016-06-04 NOTE — ED Notes (Signed)
Report given to PTAR 

## 2016-06-04 NOTE — ED Notes (Signed)
Attempted to call wife to inform her of pt transported to [place], no answer

## 2016-06-04 NOTE — ED Notes (Signed)
Patient transported to CT 

## 2016-06-04 NOTE — ED Notes (Signed)
Patient transported to MRI 

## 2016-06-04 NOTE — ED Notes (Signed)
Report given to Maximiano Cossavid Charge [facility]

## 2016-06-05 NOTE — ED Notes (Signed)
Pt departed in NAD.  

## 2016-06-05 NOTE — ED Provider Notes (Signed)
Patient received as a transfer for MRI. Vision changes. Recent hemorrhagic stroke. MRI is negative for acute stroke. Family updated. Follow-up with outpatient ophthalmology as discussed with Dr. Rhunette CroftNanavati.  Results for orders placed or performed during the hospital encounter of 06/04/16  CBC with Differential  Result Value Ref Range   WBC 5.2 4.0 - 10.5 K/uL   RBC 4.53 4.22 - 5.81 MIL/uL   Hemoglobin 13.2 13.0 - 17.0 g/dL   HCT 61.438.1 (L) 43.139.0 - 54.052.0 %   MCV 84.1 78.0 - 100.0 fL   MCH 29.1 26.0 - 34.0 pg   MCHC 34.6 30.0 - 36.0 g/dL   RDW 08.612.0 76.111.5 - 95.015.5 %   Platelets 136 (L) 150 - 400 K/uL   Neutrophils Relative % 45 %   Neutro Abs 2.4 1.7 - 7.7 K/uL   Lymphocytes Relative 41 %   Lymphs Abs 2.1 0.7 - 4.0 K/uL   Monocytes Relative 12 %   Monocytes Absolute 0.6 0.1 - 1.0 K/uL   Eosinophils Relative 1 %   Eosinophils Absolute 0.1 0.0 - 0.7 K/uL   Basophils Relative 1 %   Basophils Absolute 0.0 0.0 - 0.1 K/uL  Comprehensive metabolic panel  Result Value Ref Range   Sodium 137 135 - 145 mmol/L   Potassium 3.4 (L) 3.5 - 5.1 mmol/L   Chloride 101 101 - 111 mmol/L   CO2 24 22 - 32 mmol/L   Glucose, Bld 173 (H) 65 - 99 mg/dL   BUN 19 6 - 20 mg/dL   Creatinine, Ser 9.321.36 (H) 0.61 - 1.24 mg/dL   Calcium 9.2 8.9 - 67.110.3 mg/dL   Total Protein 7.9 6.5 - 8.1 g/dL   Albumin 3.4 (L) 3.5 - 5.0 g/dL   AST 36 15 - 41 U/L   ALT 25 17 - 63 U/L   Alkaline Phosphatase 118 38 - 126 U/L   Total Bilirubin 1.2 0.3 - 1.2 mg/dL   GFR calc non Af Amer 55 (L) >60 mL/min   GFR calc Af Amer >60 >60 mL/min   Anion gap 12 5 - 15  Troponin I  Result Value Ref Range   Troponin I <0.03 <0.03 ng/mL  CBG monitoring, ED  Result Value Ref Range   Glucose-Capillary 189 (H) 65 - 99 mg/dL   Ct Angio Head W Or Wo Contrast  Result Date: 06/04/2016 CLINICAL DATA:  Development of left-sided visual loss. History of previous strokes. EXAM: CT ANGIOGRAPHY HEAD AND NECK TECHNIQUE: Multidetector CT imaging of the head and  neck was performed using the standard protocol during bolus administration of intravenous contrast. Multiplanar CT image reconstructions and MIPs were obtained to evaluate the vascular anatomy. Carotid stenosis measurements (when applicable) are obtained utilizing NASCET criteria, using the distal internal carotid diameter as the denominator. CONTRAST:  100 cc Isovue 370 COMPARISON:  Multiple previous CT examinations earlier this year. MRI 09/02/2013. FINDINGS: CTA NECK Aortic arch: Normal Right carotid system: Common carotid artery widely patent to the bifurcation. The carotid bifurcation is normal without atherosclerotic plaque, narrowing or irregularity. Cervical internal carotid artery is widely patent. Left carotid system: Left common carotid artery widely patent to the bifurcation. The carotid bifurcation is normal without atherosclerotic plaque, narrowing or irregularity. Cervical internal carotid artery widely patent. Vertebral arteries:Right vertebral artery dominant. Right vertebral artery origin widely patent. Atherosclerotic calcification of the proximal right vertebral artery less than 1 cm from the origin with stenosis estimated at 70%. Right vertebral artery beyond Susy FrizzleMatt is widely patent through the neck.  Left vertebral artery origin is widely patent. This small vessel appears widely patent through the neck. Skeleton: Advanced cervical spondylosis with kyphotic curvature Other neck: No soft tissue lesion identified. Upper chest: Upper lungs are clear. CTA HEAD Anterior circulation: Both internal carotid arteries patent through the skullbase and siphon regions. Some atherosclerotic calcification of the carotid siphons but no flow-limiting stenosis. The anterior and middle cerebral vessels are patent without proximal stenosis, aneurysm or vascular malformation. Posterior circulation: Both vertebral arteries patent at the foramen magnum level. The right vertebral artery terminates in PICA. 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 This vessel those on an supplies the basilar which shows normal caliber. Superior cerebellar and posterior cerebral vessels are patent bilaterally. Venous sinuses: Patent and normal. Anatomic variants: None significant Delayed phase: Mild enhancement in the region of the evolving encephalomalacia in the right parietal region. IMPRESSION: No carotid disease in the neck. 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 Second 50% stenosis of the right vertebral artery just past the foramen magnum. No intracranial large or medium vessel occlusion or correctable proximal stenosis. Electronically Signed   By: Paulina Fusi M.D.   On: 06/04/2016 20:06   Ct Head Wo Contrast  Result Date: 06/04/2016 CLINICAL DATA:  Vision loss. Headache. Recent right temporoparietal hemorrhage requiring evacuation. EXAM: CT HEAD WITHOUT CONTRAST TECHNIQUE: Contiguous axial images were obtained from the base of the skull through the vertex without intravenous contrast. COMPARISON:  05/14/2016 head CT. FINDINGS: Brain: There is evolving encephalomalacia in the right temporoparietal lobe with decreased mass-effect on the local sulci in this location. There is chronic encephalomalacia in the left occipital lobe. No evidence of parenchymal hemorrhage or extra-axial fluid collection. No mass lesion, mass effect, or midline shift. No CT evidence of acute infarction. Generalized cerebral volume loss. Intracranial atherosclerosis. Nonspecific stable mild-to-moderate subcortical and periventricular white matter hypodensity, most in keeping with chronic small vessel ischemic change. Ventricles appear stable with ex vacuo dilatation of the left greater than right occipital horns. Vascular: No hyperdense vessel or unexpected calcification. Skull: No evidence of calvarial fracture. Status post right craniotomy. Sinuses/Orbits: The  visualized paranasal sinuses are essentially clear. Other: Stable partial opacification of the right mastoid air cells. Left mastoid air cells are non-opacified. IMPRESSION: 1.  No evidence of acute intracranial abnormality. 2. Evolving right temporoparietal encephalomalacia. Chronic left occipital lobe encephalomalacia. Generalized cerebral volume loss and chronic small vessel ischemia. 3. Stable right mastoid effusion. Electronically Signed   By: Delbert Phenix M.D.   On: 06/04/2016 17:49   Ct Head Wo Contrast  Result Date: 05/14/2016 CLINICAL DATA:  Acute mental status change. EXAM: CT HEAD WITHOUT CONTRAST TECHNIQUE: Contiguous axial images were obtained from the base of the skull through the vertex without intravenous contrast. COMPARISON:  April 14, 2016 FINDINGS: Brain: No subdural, epidural, or subarachnoid hemorrhage is identified on today's study. The intraparenchymal hemorrhage centered in the right parietal and temporal lobes has resolved. Decreased attenuation in this region is consistent with resulting encephalomalacia involving the white matter greater than the cortex. The patient is status post a left occipital infarct with encephalomalacia. The cerebellum, brainstem, and basal cisterns are normal. The ventricles and sulci are unchanged. No blood seen in the ventricles on today's study. The midline shift has resolved. A lacunar infarct is seen in the right caudate. No acute cortical ischemia or infarct is identified. Vascular: Chronic calcifications are stable. Vessels are otherwise  unchanged. Skull: The patient is status post right craniotomy. The bones are otherwise stable. Sinuses/Orbits: The paranasal sinuses, left mastoid air cells, and left middle ear well aerated. There is fluid in the right mastoid air cells and middle ear which was not seen previously. No bony erosion or overlying soft tissue swelling. Other: Extracranial soft tissues are unremarkable. IMPRESSION: 1. The right parietal  temporal intraparenchymal hemorrhage has resolved. There is encephalomalacia developing in this region involving the white matter greater than the cortex. 2. No acute hemorrhage. 3. Fluid in the right middle ear and right mastoid air cells with no associated bony erosion or soft tissue swelling. Recommend clinical correlation. Electronically Signed   By: Gerome Sam III M.D   On: 05/14/2016 15:13   Ct Angio Neck W And/or Wo Contrast  Result Date: 06/04/2016 CLINICAL DATA:  Development of left-sided visual loss. History of previous strokes. EXAM: CT ANGIOGRAPHY HEAD AND NECK TECHNIQUE: Multidetector CT imaging of the head and neck was performed using the standard protocol during bolus administration of intravenous contrast. Multiplanar CT image reconstructions and MIPs were obtained to evaluate the vascular anatomy. Carotid stenosis measurements (when applicable) are obtained utilizing NASCET criteria, using the distal internal carotid diameter as the denominator. CONTRAST:  100 cc Isovue 370 COMPARISON:  Multiple previous CT examinations earlier this year. MRI 09/02/2013. FINDINGS: CTA NECK Aortic arch: Normal Right carotid system: Common carotid artery widely patent to the bifurcation. The carotid bifurcation is normal without atherosclerotic plaque, narrowing or irregularity. Cervical internal carotid artery is widely patent. Left carotid system: Left common carotid artery widely patent to the bifurcation. The carotid bifurcation is normal without atherosclerotic plaque, narrowing or irregularity. Cervical internal carotid artery widely patent. Vertebral arteries:Right vertebral artery dominant. Right vertebral artery origin widely patent. Atherosclerotic calcification of the proximal right vertebral artery less than 1 cm from the origin with stenosis estimated at 70%. Right vertebral artery beyond Susy Frizzle is widely patent through the neck. Left vertebral artery origin is widely patent. This small vessel  appears widely patent through the neck. Skeleton: Advanced cervical spondylosis with kyphotic curvature Other neck: No soft tissue lesion identified. Upper chest: Upper lungs are clear. CTA HEAD Anterior circulation: Both internal carotid arteries patent through the skullbase and siphon regions. Some atherosclerotic calcification of the carotid siphons but no flow-limiting stenosis. The anterior and middle cerebral vessels are patent without proximal stenosis, aneurysm or vascular malformation. Posterior circulation: Both vertebral arteries patent at the foramen magnum level. The right vertebral artery terminates in PICA. There is atherosclerotic disease of the left vertebral artery with stenosis estimated at 50%. This vessel those on an supplies the basilar which shows normal caliber. Superior cerebellar and posterior cerebral vessels are patent bilaterally. Venous sinuses: Patent and normal. Anatomic variants: None significant Delayed phase: Mild enhancement in the region of the evolving encephalomalacia in the right parietal region. IMPRESSION: No carotid disease in the neck. 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 Second 50% stenosis of the right vertebral artery just past the foramen magnum. No intracranial large or medium vessel occlusion or correctable proximal stenosis. Electronically Signed   By: Paulina Fusi M.D.   On: 06/04/2016 20:06   Mr Brain Wo Contrast  Result Date: 06/05/2016 CLINICAL DATA:  Vision loss EXAM: MRI HEAD WITHOUT CONTRAST TECHNIQUE: Multiplanar, multiecho pulse sequences of the brain and surrounding structures were obtained without intravenous contrast. COMPARISON:  Head CT 06/04/2016 and before. FINDINGS: Brain: No acute infarct or  intraparenchymal hemorrhage. The midline structures are normal. There is advanced bilateral periventricular hyperintense T2 weighted signal compatible with chronic microvascular ischemia. Right parietotemporal  encephalomalacia and hemosiderin deposition is seen compatible with prior hemorrhagic event. There is still left occipital encephalomalacia unchanged compared to prior CT. No mass effect or midline shift. Volume loss advanced for age. Vascular: Major intracranial flow voids are preserved. No evidence of chronic microhemorrhage or amyloid angiopathy. Skull and upper cervical spine: The visualized skull base, calvarium, upper cervical spine and extracranial soft tissues are normal. Sinuses/Orbits: Bilateral mastoid effusions. Normal orbits. IMPRESSION: 1. No acute intracranial abnormality. 2. Right parietotemporal and left occipital encephalomalacia, as demonstrated on prior CT examinations. 3. Advanced volume loss and microvascular ischemia. Electronically Signed   By: Deatra Robinson M.D.   On: 06/05/2016 00:32      Shon Baton, MD 06/05/16 [phone]

## 2016-06-07 DIAGNOSIS — R278 Other lack of coordination: Secondary | ICD-10-CM | POA: Diagnosis not present

## 2016-06-07 DIAGNOSIS — E119 Type 2 diabetes mellitus without complications: Secondary | ICD-10-CM | POA: Diagnosis not present

## 2016-06-07 DIAGNOSIS — I69312 Visuospatial deficit and spatial neglect following cerebral infarction: Secondary | ICD-10-CM | POA: Diagnosis not present

## 2016-06-07 DIAGNOSIS — R414 Neurologic neglect syndrome: Secondary | ICD-10-CM | POA: Diagnosis not present

## 2016-06-19 ENCOUNTER — Telehealth: Payer: Self-pay | Admitting: Physical Medicine & Rehabilitation

## 2016-06-19 NOTE — Telephone Encounter (Signed)
Ernest [name] with [facility] needs to get a verbal to extend [name] for patient.  Please call her at [phone].

## 2016-06-20 NOTE — Telephone Encounter (Signed)
Approval given

## 2016-06-20 NOTE — Telephone Encounter (Signed)
Raynelle FanningJulie is returning your call verbal orders 2w2 and 1w2 please call her at [phone].

## 2016-06-20 NOTE — Telephone Encounter (Signed)
Left message to call office for orders since no name on voicemail

## 2016-08-11 ENCOUNTER — Emergency Department (HOSPITAL_BASED_OUTPATIENT_CLINIC_OR_DEPARTMENT_OTHER): Payer: Medicare Other

## 2016-08-11 ENCOUNTER — Emergency Department (HOSPITAL_BASED_OUTPATIENT_CLINIC_OR_DEPARTMENT_OTHER)
Admission: EM | Admit: 2016-08-11 | Discharge: 2016-08-11 | Disposition: A | Discharge status: Home / Self Care | Payer: Medicare Other | Attending: Emergency Medicine | Admitting: Emergency Medicine

## 2016-08-11 ENCOUNTER — Encounter (HOSPITAL_BASED_OUTPATIENT_CLINIC_OR_DEPARTMENT_OTHER): Payer: Self-pay | Admitting: *Deleted

## 2016-08-11 DIAGNOSIS — Z794 Long term (current) use of insulin: Secondary | ICD-10-CM | POA: Insufficient documentation

## 2016-08-11 DIAGNOSIS — E119 Type 2 diabetes mellitus without complications: Secondary | ICD-10-CM | POA: Diagnosis not present

## 2016-08-11 DIAGNOSIS — R220 Localized swelling, mass and lump, head: Secondary | ICD-10-CM | POA: Diagnosis present

## 2016-08-11 DIAGNOSIS — I110 Hypertensive heart disease with heart failure: Secondary | ICD-10-CM | POA: Insufficient documentation

## 2016-08-11 DIAGNOSIS — Z7982 Long term (current) use of aspirin: Secondary | ICD-10-CM | POA: Diagnosis not present

## 2016-08-11 DIAGNOSIS — R609 Edema, unspecified: Secondary | ICD-10-CM | POA: Insufficient documentation

## 2016-08-11 DIAGNOSIS — R062 Wheezing: Secondary | ICD-10-CM | POA: Insufficient documentation

## 2016-08-11 DIAGNOSIS — Z87891 Personal history of nicotine dependence: Secondary | ICD-10-CM | POA: Insufficient documentation

## 2016-08-11 DIAGNOSIS — Z79899 Other long term (current) drug therapy: Secondary | ICD-10-CM | POA: Insufficient documentation

## 2016-08-11 DIAGNOSIS — I509 Heart failure, unspecified: Secondary | ICD-10-CM | POA: Diagnosis not present

## 2016-08-11 LAB — COMPREHENSIVE METABOLIC PANEL
ALT: 18 U/L (ref 17–63)
ANION GAP: 10 (ref 5–15)
AST: 35 U/L (ref 15–41)
Albumin: 3.3 g/dL — ABNORMAL LOW (ref 3.5–5.0)
Alkaline Phosphatase: 145 U/L — ABNORMAL HIGH (ref 38–126)
BUN: 26 mg/dL — ABNORMAL HIGH (ref 6–20)
CHLORIDE: 105 mmol/L (ref 101–111)
CO2: 22 mmol/L (ref 22–32)
CREATININE: 1.51 mg/dL — AB (ref 0.61–1.24)
Calcium: 8.8 mg/dL — ABNORMAL LOW (ref 8.9–10.3)
GFR, EST AFRICAN AMERICAN: 57 mL/min — AB (ref >60)
GFR, EST NON AFRICAN AMERICAN: 49 mL/min — AB (ref >60)
Glucose, Bld: 222 mg/dL — ABNORMAL HIGH (ref 65–99)
POTASSIUM: 3.4 mmol/L — AB (ref 3.5–5.1)
Sodium: 137 mmol/L (ref 135–145)
Total Bilirubin: 0.7 mg/dL (ref 0.3–1.2)
Total Protein: 7.5 g/dL (ref 6.5–8.1)

## 2016-08-11 LAB — BRAIN NATRIURETIC PEPTIDE: B Natriuretic Peptide: 164.8 pg/mL — ABNORMAL HIGH (ref 0.0–100.0)

## 2016-08-11 LAB — URINE MICROSCOPIC-ADD ON

## 2016-08-11 LAB — CBC WITH DIFFERENTIAL/PLATELET
Basophils Absolute: 0 10*3/uL (ref 0.0–0.1)
Basophils Relative: 0 %
EOS PCT: 2 %
Eosinophils Absolute: 0.1 10*3/uL (ref 0.0–0.7)
HCT: 30.8 % — ABNORMAL LOW (ref 39.0–52.0)
Hemoglobin: 10.5 g/dL — ABNORMAL LOW (ref 13.0–17.0)
LYMPHS ABS: 1.6 10*3/uL (ref 0.7–4.0)
LYMPHS PCT: 28 %
MCH: 29.2 pg (ref 26.0–34.0)
MCHC: 34.1 g/dL (ref 30.0–36.0)
MCV: 85.6 fL (ref 78.0–100.0)
MONO ABS: 1 10*3/uL (ref 0.1–1.0)
MONOS PCT: 16 %
Neutro Abs: 3.2 10*3/uL (ref 1.7–7.7)
Neutrophils Relative %: 54 %
PLATELETS: 131 10*3/uL — AB (ref 150–400)
RBC: 3.6 MIL/uL — ABNORMAL LOW (ref 4.22–5.81)
RDW: 14.2 % (ref 11.5–15.5)
WBC: 5.9 10*3/uL (ref 4.0–10.5)

## 2016-08-11 LAB — URINALYSIS, ROUTINE W REFLEX MICROSCOPIC
Bilirubin Urine: NEGATIVE
Glucose, UA: NEGATIVE mg/dL
Ketones, ur: NEGATIVE mg/dL
Leukocytes, UA: NEGATIVE
Nitrite: NEGATIVE
Protein, ur: >300 mg/dL — AB
Specific Gravity, Urine: 1.015 (ref 1.005–1.030)
pH: 5.5 (ref 5.0–8.0)

## 2016-08-11 LAB — TROPONIN I
Troponin I: <0.03 ng/mL (ref <0.03)
Troponin I: <0.03 ng/mL (ref <0.03)

## 2016-08-11 MED ORDER — FUROSEMIDE 10 MG/ML IJ SOLN
40.0000 mg | Freq: Once | INTRAMUSCULAR | Status: AC
  Administered 2016-08-11: 40 mg via INTRAVENOUS
  Filled 2016-08-11: qty 4

## 2016-08-11 MED ORDER — CLONIDINE HCL 0.1 MG PO TABS
0.3000 mg | ORAL_TABLET | Freq: Once | ORAL | Status: AC
  Administered 2016-08-11: 0.3 mg via ORAL
  Filled 2016-08-11: qty 3

## 2016-08-11 MED ORDER — LISINOPRIL 10 MG PO TABS
40.0000 mg | ORAL_TABLET | Freq: Once | ORAL | Status: AC
  Administered 2016-08-11: 40 mg via ORAL
  Filled 2016-08-11: qty 4

## 2016-08-11 MED ORDER — IPRATROPIUM-ALBUTEROL 0.5-2.5 (3) MG/3ML IN SOLN
RESPIRATORY_TRACT | Status: AC
  Filled 2016-08-11: qty 3

## 2016-08-11 MED ORDER — IPRATROPIUM-ALBUTEROL 0.5-2.5 (3) MG/3ML IN SOLN
3.0000 mL | Freq: Once | RESPIRATORY_TRACT | Status: AC
  Administered 2016-08-11: 3 mL via RESPIRATORY_TRACT

## 2016-08-11 MED ORDER — METOPROLOL TARTRATE 50 MG PO TABS: 50.0000 mg | ORAL_TABLET | Freq: Once | ORAL | Status: DC

## 2016-08-11 MED ORDER — METOPROLOL TARTRATE 50 MG PO TABS
25.0000 mg | ORAL_TABLET | Freq: Once | ORAL | Status: AC
  Administered 2016-08-11: 25 mg via ORAL
  Filled 2016-08-11: qty 1

## 2016-08-11 NOTE — ED Notes (Signed)
ED Provider at bedside. 

## 2016-08-11 NOTE — ED Provider Notes (Signed)
[facility] Provider Note   CSN: [id number] Arrival date & time: 08/11/16  4098     History   Chief Complaint Chief Complaint  Patient presents with  . Shortness of Breath  . Facial Swelling    HPI Ernest [name] is a 60 y.o. male.  HPI    [name] presents today with wheezing and edema. Patient has a history of stroke, his wife is at bedside and provides a majority of his history. She reports patient has suffered several strokes, and was recently hospitalized in October of this year for urinary tract infection. Patient's illness was significant for metabolic encephalopathy, he was seen by psych, MRI, and had to be physically restrained due to outbursts in the hospital. She reports that after IV antibiotics patient's symptoms improved, but he did not return back to his previous baseline. She reports he has been doing well at home with no fever, systemic symptoms, but notes that his memory still is not back to where it was previously. She reports he always has some short-term memory loss due to the stroke.  Most recently she noticed puffiness around patient's eyes and face, also notes that he has had lower extremity swelling and edema since hospital admission. She reports the left greater than right, but notes that this is been persistent since hospitalization. She reports he complains that he needs more pillows as he does not want to lay flat, patient denies this. She notes minor upper respiratory wheezing yesterday during the day that has worsened today. She notes his urine was clear after leaving the hospital, now slightly dark. She reports he is eating and drinking appropriately, does not seem to be in any acute distress.  Patient reports that his chest feels tight and has wheezing, he denies chest pain to me. Patient was telling the nursing staff that he was having chest pain and rated at a 6, I clarified with him, patient denied any chest pain, reporting he feels tightness  and wheezing.  Chart review shows back in July when patient suffered a hemorrhagic stroke patient developed anasarca, likely secondary to IV fluids. Hospital staff suspected mild diastolic dysfunction, he was diuresis with IV Lasix with improvement of anasarca.   Ultrasound in July shows no signs of systolic or diastolic dysfunction.  Patient denies any fever chills nausea vomiting, abdominal pain, chest pain. Wife notes the patient is taking MEDICATIONS daily, has not has daily medications today. Patient currently taking 40 mg of Lasix twice a day   Past Medical History:  Diagnosis Date  . CHF (congestive heart failure) (HCC)   . CVA (cerebral infarction)   . Diabetes mellitus without complication (HCC)   . GERD (gastroesophageal reflux disease)   . Hypertension   . OSA (obstructive sleep apnea)   . Seizure disorder [facility])     Patient Active Problem List   Diagnosis Date Noted  . UTI (urinary tract infection), uncomplicated 05/13/2016  . Adjustment disorder with depressed mood   . Other hypervolemia   . 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 Abscess of right arm 08/18/2013  . Essential hypertension 08/18/2013  . Diabetes mellitus type 2 in nonobese (HCC) 08/18/2013  . Thrombocytopenia, unspecified 08/18/2013    Past Surgical History:  Procedure Laterality Date  . APPLICATION OF WOUND VAC Right 08/20/2013   Procedure: VAC ASSISTED CLOSURE FOREARM;  Surgeon: Dominica Severin, MD;  Location: [facility] OR;  Service: Orthopedics;  Laterality: Right;  .  BRAIN SURGERY  04/07/2016   for hemorragic stroke  . I&D EXTREMITY Right 08/18/2013   Procedure: IRRIGATION AND DEBRIDEMENT EXTREMITY;  Surgeon: Dominica Severin, MD;  Location: [facility] OR;  Service: Orthopedics;  Laterality: Right;  . I&D EXTREMITY Right 08/20/2013   Procedure: IRRIGATION AND DEBRIDEMENT WASHOUT ARM;  Surgeon: Dominica Severin, MD;  Location: [facility] OR;   Service: Orthopedics;  Laterality: Right;  . I&D EXTREMITY Right 08/22/2013   Procedure: IRRIGATION AND DEBRIDEMENT FOREARM COMPLEX WITH WOUND CLOSURE;  Surgeon: Dominica Severin, MD;  Location: [facility] OR;  Service: Orthopedics;  Laterality: Right;       Home Medications    Prior to Admission medications   Medication Sig Start Date End Date Taking? Authorizing Provider  amLODipine (NORVASC) 10 MG tablet Take 0.5 tablets (5 mg total) by mouth daily. 05/16/16  Yes Evlyn Kanner Love, PA-C  aspirin EC 81 MG tablet Take 81 mg by mouth daily.   Yes Historical Provider, MD  cholecalciferol 1000 units tablet Take 1 tablet (1,000 Units total) by mouth daily. 05/16/16  Yes Evlyn Kanner Love, PA-C  cloNIDine (CATAPRES) 0.3 MG tablet Take 1 tablet (0.3 mg total) by mouth 3 (three) times daily. 05/16/16  Yes Evlyn Kanner Love, PA-C  cyclobenzaprine (FLEXERIL) 5 MG tablet Take 1 tablet (5 mg total) by mouth at bedtime. 05/16/16  Yes Evlyn Kanner Love, PA-C  divalproex (DEPAKOTE) 500 MG DR tablet Take 500 mg by mouth 2 (two) times daily.   Yes Historical Provider, MD  DULoxetine (CYMBALTA) 30 MG capsule Take 1 capsule (30 mg total) by mouth daily. 05/16/16  Yes Evlyn Kanner Love, PA-C  folic acid (FOLVITE) 1 MG tablet Take 1 tablet (1 mg total) by mouth daily. 05/16/16  Yes Evlyn Kanner Love, PA-C  furosemide (LASIX) 40 MG tablet Take 1 tablet (40 mg total) by mouth 2 (two) times daily. 05/16/16  Yes Evlyn Kanner Love, PA-C  hydrALAZINE (APRESOLINE) 100 MG tablet Take 1 tablet (100 mg total) by mouth 4 (four) times daily -  with meals and at bedtime. 05/16/16  Yes Evlyn Kanner Love, PA-C  insulin detemir (LEVEMIR) 100 UNIT/ML injection Inject 0.12 mLs (12 Units total) into the skin 2 (two) times daily. Patient taking differently: Inject 0-12 Units into the skin 2 (two) times daily.  05/16/16  Yes Evlyn Kanner Love, PA-C  levETIRAcetam (KEPPRA) 500 MG tablet Take 1 tablet (500 mg total) by mouth 2 (two) times daily. 05/16/16  Yes Evlyn Kanner Love, PA-C    lisinopril (PRINIVIL,ZESTRIL) 40 MG tablet Take 1 tablet (40 mg total) by mouth daily. 05/16/16  Yes Evlyn Kanner Love, PA-C  metoprolol (LOPRESSOR) 50 MG tablet Take 25 mg by mouth 2 (two) times daily.   Yes Historical Provider, MD  Multiple Vitamins-Minerals (MULTIVITAMIN WITH MINERALS) tablet Take 1 tablet by mouth daily.   Yes Historical Provider, MD  omeprazole (PRILOSEC) 20 MG capsule Take 40 mg by mouth daily.   Yes Historical Provider, MD  polyethylene glycol (MIRALAX / GLYCOLAX) packet Take 17 g by mouth daily. 05/16/16  Yes Evlyn Kanner Love, PA-C  tamsulosin (FLOMAX) 0.4 MG CAPS capsule Take 1 capsule (0.4 mg total) by mouth daily after supper. 05/16/16  Yes Evlyn Kanner Love, PA-C  thiamine (VITAMIN B-1) 100 MG tablet Take 100 mg by mouth daily.   Yes Historical Provider, MD  potassium chloride SA (K-DUR,KLOR-CON) 20 MEQ tablet Take 2 tablets (40 mEq total) by mouth 3 (three) times daily. 05/16/16   Jacquelynn Cree, PA-C  Family History No family history on file.  Social History Social History  Substance Use Topics  . Smoking status: Former Games developer  . Smokeless tobacco: Never Used  . Alcohol use No     Allergies   Shellfish allergy and Cymbalta [duloxetine hcl]   Review of Systems Review of Systems  All other systems reviewed and are negative.    Physical Exam Updated Vital Signs BP 145/91   Pulse 76   Temp 98.3 F (36.8 C) (Oral)   Resp 12   Ht 5\' 8"  (1.727 m)   Wt 88.5 kg   SpO2 97%   BMI 29.65 kg/m   Physical Exam  Constitutional: He is oriented to person, place, and time. He appears well-developed and well-nourished.  HENT:  Head: Normocephalic and atraumatic.  Eyes: Conjunctivae are normal. Pupils are equal, round, and reactive to light. Right eye exhibits no discharge. Left eye exhibits no discharge. No scleral icterus.  Neck: Normal range of motion. No JVD present. No tracheal deviation present.  Cardiovascular: Normal rate and regular rhythm.    Pulmonary/Chest: Effort normal. No stridor. No respiratory distress. He has wheezes. He has no rales. He exhibits no tenderness.  Minor UR wheeze  Abdominal: Soft.  Musculoskeletal:  Bilateral LEE worse on the left   Neurological: He is alert and oriented to person, place, and time. Coordination normal.  Skin: Skin is warm.  Psychiatric: He has a normal mood and affect. His behavior is normal. Judgment and thought content normal.  Nursing note and vitals reviewed.    ED Treatments / Results  Labs (all labs ordered are listed, but only abnormal results are displayed) Labs Reviewed  BRAIN NATRIURETIC PEPTIDE - Abnormal; Notable for the following:       Result Value   B Natriuretic Peptide 164.8 (*)    All other components within normal limits  CBC WITH DIFFERENTIAL/PLATELET - Abnormal; Notable for the following:    RBC 3.60 (*)    Hemoglobin 10.5 (*)    HCT 30.8 (*)    Platelets 131 (*)    All other components within normal limits  COMPREHENSIVE METABOLIC PANEL - Abnormal; Notable for the following:    Potassium 3.4 (*)    Glucose, Bld 222 (*)    BUN 26 (*)    Creatinine, Ser 1.51 (*)    Calcium 8.8 (*)    Albumin 3.3 (*)    Alkaline Phosphatase 145 (*)    GFR calc non Af Amer 49 (*)    GFR calc Af Amer 57 (*)    All other components within normal limits  URINALYSIS, ROUTINE W REFLEX MICROSCOPIC (NOT AT [facility]) - Abnormal; Notable for the following:    APPearance CLOUDY (*)    Hgb urine dipstick SMALL (*)    Protein, ur >300 (*)    All other components within normal limits  URINE MICROSCOPIC-ADD ON - Abnormal; Notable for the following:    Squamous Epithelial / LPF 0-5 (*)    Bacteria, UA MANY (*)    All other components within normal limits  TROPONIN I  TROPONIN I    EKG  EKG Interpretation  Date/Time:  Sunday August 11 2016 09:26:43 EST Ventricular Rate:  90 PR Interval:    QRS Duration: 87 QT Interval:  367 QTC Calculation: 449 R Axis:   30 Text  Interpretation:  Sinus rhythm Ventricular premature complex Left atrial enlargement Low voltage, precordial leads Probable anteroseptal infarct, old No significant change since last tracing Confirmed by LITTLE  MD, Fleet ContrasACHEL [phone]) on 08/11/2016 9:29:00 AM Also confirmed by LITTLE MD, RACHEL [phone]), editor [place]  CCT, BEVERLY ([id number])  on 08/11/2016 10:45:01 AM       Radiology Dg Chest 2 View  Result Date: 08/11/2016 CLINICAL DATA:  Recent urinary tract infection. Swelling and wheezing. EXAM: CHEST  2 VIEW COMPARISON:  Chest radiograph 07/21/2016. FINDINGS: Monitoring leads overlie the patient. Normal cardiac and mediastinal contours. No large area pulmonary consolidation. No pleural effusion or pneumothorax. Thoracic spine degenerative changes. IMPRESSION: No active cardiopulmonary disease. Electronically Signed   By: Annia Beltrew  Davis M.D.   On: 08/11/2016 10:13   [place] Venous Img Lower Unilateral Left  Result Date: 08/11/2016 CLINICAL DATA:  Left lower extremity edema. EXAM: LEFT LOWER EXTREMITY VENOUS DOPPLER ULTRASOUND TECHNIQUE: Gray-scale sonography with graded compression, as well as color Doppler and duplex ultrasound were performed to evaluate the lower extremity deep venous systems from the level of the common femoral vein and including the common femoral, femoral, profunda femoral, popliteal and calf veins including the posterior tibial, peroneal and gastrocnemius veins when visible. The superficial great saphenous vein was also interrogated. Spectral Doppler was utilized to evaluate flow at rest and with distal augmentation maneuvers in the common femoral, femoral and popliteal veins. COMPARISON:  None. FINDINGS: Contralateral Common Femoral Vein: Respiratory phasicity is normal and symmetric with the symptomatic side. No evidence of thrombus. Normal compressibility. Common Femoral Vein: No evidence of thrombus. Normal compressibility, respiratory phasicity and response to augmentation.  Saphenofemoral Junction: No evidence of thrombus. Normal compressibility and flow on color Doppler imaging. Profunda Femoral Vein: No evidence of thrombus. Normal compressibility and flow on color Doppler imaging. Femoral Vein: No evidence of thrombus. Normal compressibility, respiratory phasicity and response to augmentation. Popliteal Vein: No evidence of thrombus. Normal compressibility, respiratory phasicity and response to augmentation. Calf Veins: No evidence of thrombus. Normal compressibility and flow on color Doppler imaging. Superficial Great Saphenous Vein: No evidence of thrombus. Normal compressibility and flow on color Doppler imaging. Venous Reflux:  None. Other Findings: No evidence of superficial thrombophlebitis or abnormal fluid collection. IMPRESSION: No evidence of left lower extremity deep venous thrombosis. Electronically Signed   By: Irish LackGlenn  Yamagata M.D.   On: 08/11/2016 12:25    Procedures Procedures (including critical care time)  Medications Ordered in ED Medications  ipratropium-albuterol (DUONEB) 0.5-2.5 (3) MG/3ML nebulizer solution 3 mL (3 mLs Nebulization Given 08/11/16 0940)  furosemide (LASIX) injection 40 mg (40 mg Intravenous Given 08/11/16 1017)  cloNIDine (CATAPRES) tablet 0.3 mg (0.3 mg Oral Given 08/11/16 1029)  lisinopril (PRINIVIL,ZESTRIL) tablet 40 mg (40 mg Oral Given 08/11/16 1028)  metoprolol (LOPRESSOR) tablet 25 mg (25 mg Oral Given 08/11/16 1029)     Initial Impression / Assessment and Plan / ED Course  I have reviewed the triage vital signs and the nursing notes.  Pertinent labs & imaging results that were available during my care of the patient were reviewed by me and considered in my medical decision making (see chart for details).  Clinical Course     Final Clinical Impressions(s) / ED Diagnoses   Final diagnoses:  Wheeze  Edema, unspecified type    Labs:Urinalysis, troponin, CBC, CMP, BNP  Imaging: DG  chest  Consults:  Therapeutics:  Discharge Meds:   Assessment/Plan:  60 year old male presents today with wheezing and lower extremity edema. Patient had very minor upper respiratory wheeze which was improved with breathing treatment here in the ED. Patient has clear lung sounds and a negative chest x-ray. Patient does  have bilateral lower extremity edema worse on the left. He has had this since his last hospital admission, ultrasound here shows no signs of DVT on the left. Patient does have slight puffiness to the face. Originally question fluid overload, patient has reported history of diastolic dysfunction, but echo from July of this year shows no such finding. Patient has not seen a cardiologist, and I can see no actual diagnosis of heart failure on this patient. Patient denied any chest pain here to me, he has negative troponin, unchanged EKG. I have low suspicion or ACS, PE in this patient. No signs of allergic type reaction. This was discussed with Frederick Peersachel Little M.D.. Patient has signs of minor elevation in BUN and creatinine ( 1.24 at discharge on 08/01/2016) , his presentation could likely be to increased Lasix from Hospital of discharge. Patient has no signs of heart failure, pulmonary edema, respiratory distress. I feel it is appropriate for patient to hold Lasix this evening and tomorrow, follow up with primary care within the next 1-2 days for reassessment of kidney function and fluid status. Both the patient and his wife agreed to today's plan. Delta troponin will be ordered, as long as this is negative patient will be discharged home with strict return precautions and primary care follow-up. Patient's wife notes that she appears that is facial swelling has dissipated while here in the hospital. Patient lying flat on his back in no acute distress sleeping upon my final evaluation. No further questions or concerns at the time of discharge.     .New Prescriptions New Prescriptions   No  medications on file     Eyvonne MechanicJeffrey Lama Narayanan, PA-C 08/11/16 1416    Laurence Spatesachel Morgan Little, MD 08/11/16 [phone]

## 2016-08-11 NOTE — ED Notes (Signed)
Pt's wife reports CVA x2 (2332yrs ago and this past July); reports deficits of short-term memory problems and vision impairment.

## 2016-08-11 NOTE — ED Notes (Signed)
Patient transported to X-ray 

## 2016-08-11 NOTE — ED Triage Notes (Signed)
Pt's wife reports pt was admitted to hospital for UTI and was discharged 08/01/2016. Reports pt developed facial swelling and wheezing yesterday. Pt denies feeling sob. Denies fever, n/v/d. Also reports L leg swelling (denies pain) that began during hospital admission. Pt is partially blind (peripheral vision intact).

## 2016-08-11 NOTE — ED Notes (Signed)
Patient transported to Ultrasound 

## 2016-08-11 NOTE — Discharge Instructions (Signed)
Please contact your primary care provider tomorrow morning and schedule follow-up evaluation in the next 2 days. Please inform them of today's visit and all relevant data requiring close follow-up evaluation. Please return to the emergency room if you experience any new or worsening signs or symptoms.

## 2016-09-02 ENCOUNTER — Emergency Department (HOSPITAL_BASED_OUTPATIENT_CLINIC_OR_DEPARTMENT_OTHER): Payer: Medicare Other

## 2016-09-02 ENCOUNTER — Emergency Department (HOSPITAL_BASED_OUTPATIENT_CLINIC_OR_DEPARTMENT_OTHER)
Admission: EM | Admit: 2016-09-02 | Discharge: 2016-09-02 | Disposition: A | Discharge status: Home / Self Care | Payer: Medicare Other | Attending: Emergency Medicine | Admitting: Emergency Medicine

## 2016-09-02 ENCOUNTER — Encounter (HOSPITAL_BASED_OUTPATIENT_CLINIC_OR_DEPARTMENT_OTHER): Payer: Self-pay | Admitting: *Deleted

## 2016-09-02 DIAGNOSIS — R601 Generalized edema: Secondary | ICD-10-CM | POA: Diagnosis not present

## 2016-09-02 DIAGNOSIS — R1909 Other intra-abdominal and pelvic swelling, mass and lump: Secondary | ICD-10-CM | POA: Insufficient documentation

## 2016-09-02 DIAGNOSIS — I509 Heart failure, unspecified: Secondary | ICD-10-CM | POA: Insufficient documentation

## 2016-09-02 DIAGNOSIS — E119 Type 2 diabetes mellitus without complications: Secondary | ICD-10-CM | POA: Diagnosis not present

## 2016-09-02 DIAGNOSIS — K7460 Unspecified cirrhosis of liver: Secondary | ICD-10-CM

## 2016-09-02 DIAGNOSIS — K7031 Alcoholic cirrhosis of liver with ascites: Secondary | ICD-10-CM | POA: Diagnosis not present

## 2016-09-02 DIAGNOSIS — I110 Hypertensive heart disease with heart failure: Secondary | ICD-10-CM | POA: Diagnosis not present

## 2016-09-02 DIAGNOSIS — R0602 Shortness of breath: Secondary | ICD-10-CM | POA: Diagnosis present

## 2016-09-02 DIAGNOSIS — Z87891 Personal history of nicotine dependence: Secondary | ICD-10-CM | POA: Diagnosis not present

## 2016-09-02 DIAGNOSIS — R188 Other ascites: Secondary | ICD-10-CM

## 2016-09-02 DIAGNOSIS — R52 Pain, unspecified: Secondary | ICD-10-CM

## 2016-09-02 LAB — CBC WITH DIFFERENTIAL/PLATELET
Basophils Absolute: 0 10*3/uL (ref 0.0–0.1)
Basophils Relative: 1 %
Eosinophils Absolute: 0.2 10*3/uL (ref 0.0–0.7)
Eosinophils Relative: 2 %
HEMATOCRIT: 30.9 % — AB (ref 39.0–52.0)
HEMOGLOBIN: 10.2 g/dL — AB (ref 13.0–17.0)
LYMPHS ABS: 1.6 10*3/uL (ref 0.7–4.0)
LYMPHS PCT: 21 %
MCH: 29.4 pg (ref 26.0–34.0)
MCHC: 33 g/dL (ref 30.0–36.0)
MCV: 89 fL (ref 78.0–100.0)
Monocytes Absolute: 1.1 10*3/uL — ABNORMAL HIGH (ref 0.1–1.0)
Monocytes Relative: 15 %
NEUTROS PCT: 61 %
Neutro Abs: 4.5 10*3/uL (ref 1.7–7.7)
Platelets: 130 10*3/uL — ABNORMAL LOW (ref 150–400)
RBC: 3.47 MIL/uL — AB (ref 4.22–5.81)
RDW: 13.7 % (ref 11.5–15.5)
WBC: 7.4 10*3/uL (ref 4.0–10.5)

## 2016-09-02 LAB — HEPATIC FUNCTION PANEL
ALBUMIN: 3.2 g/dL — AB (ref 3.5–5.0)
ALK PHOS: 121 U/L (ref 38–126)
ALT: 23 U/L (ref 17–63)
AST: 49 U/L — ABNORMAL HIGH (ref 15–41)
BILIRUBIN INDIRECT: 0.5 mg/dL (ref 0.3–0.9)
BILIRUBIN TOTAL: 0.7 mg/dL (ref 0.3–1.2)
Bilirubin, Direct: 0.2 mg/dL (ref 0.1–0.5)
Total Protein: 7.5 g/dL (ref 6.5–8.1)

## 2016-09-02 LAB — BASIC METABOLIC PANEL
Anion gap: 9 (ref 5–15)
BUN: 31 mg/dL — ABNORMAL HIGH (ref 6–20)
CHLORIDE: 106 mmol/L (ref 101–111)
CO2: 23 mmol/L (ref 22–32)
Calcium: 8.9 mg/dL (ref 8.9–10.3)
Creatinine, Ser: 1.87 mg/dL — ABNORMAL HIGH (ref 0.61–1.24)
GFR calc non Af Amer: 38 mL/min — ABNORMAL LOW (ref >60)
GFR, EST AFRICAN AMERICAN: 44 mL/min — AB (ref >60)
Glucose, Bld: 132 mg/dL — ABNORMAL HIGH (ref 65–99)
POTASSIUM: 4.1 mmol/L (ref 3.5–5.1)
SODIUM: 138 mmol/L (ref 135–145)

## 2016-09-02 LAB — URINE MICROSCOPIC-ADD ON

## 2016-09-02 LAB — URINALYSIS, ROUTINE W REFLEX MICROSCOPIC
BILIRUBIN URINE: NEGATIVE
Glucose, UA: NEGATIVE mg/dL
HGB URINE DIPSTICK: NEGATIVE
Ketones, ur: NEGATIVE mg/dL
Leukocytes, UA: NEGATIVE
NITRITE: NEGATIVE
PROTEIN: 100 mg/dL — AB
Specific Gravity, Urine: 1.011 (ref 1.005–1.030)
pH: 5.5 (ref 5.0–8.0)

## 2016-09-02 LAB — TROPONIN I: Troponin I: <0.03 ng/mL (ref <0.03)

## 2016-09-02 LAB — PROTIME-INR
INR: 1.12
PROTHROMBIN TIME: 14.5 s (ref 11.4–15.2)

## 2016-09-02 LAB — MAGNESIUM: MAGNESIUM: 1.9 mg/dL (ref 1.7–2.4)

## 2016-09-02 LAB — CK: Total CK: 292 U/L (ref 49–397)

## 2016-09-02 LAB — BRAIN NATRIURETIC PEPTIDE: B Natriuretic Peptide: 271.7 pg/mL — ABNORMAL HIGH (ref 0.0–100.0)

## 2016-09-02 LAB — PHOSPHORUS: PHOSPHORUS: 3.7 mg/dL (ref 2.5–4.6)

## 2016-09-02 LAB — VALPROIC ACID LEVEL: Valproic Acid Lvl: 60 ug/mL (ref 50.0–100.0)

## 2016-09-02 NOTE — ED Triage Notes (Addendum)
C/o wheezing x 2 weeks. Has been seen here and [facility] for same. Denies pain. States chest hurts with coughing. Nausea yesterday no fever or v/d. Family states pt face looked swollen as of this am. Just started on Spironolactone on Friday.

## 2016-09-02 NOTE — ED Notes (Signed)
MD in with pt

## 2016-09-02 NOTE — ED Notes (Signed)
Pt c/o stretcher not being comfortable. Requested a chair. Geri chair placed in room. Pt assisted to chair.

## 2016-09-02 NOTE — ED Provider Notes (Signed)
[facility] Provider Note   CSN: [id number] Arrival date & time: 09/02/16  0910     History   Chief Complaint Chief Complaint  Patient presents with  . Shortness of Breath    HPI Ernest [name] is a 60 y.o. male.  HPI Patient's wife reports that he has had problems with swelling as well as some pain. She reports this morning when he got up his face was very puffy and it was hard for him to open his eyes. He is also had increased swelling of his lower extremities. This has been a waxing and waning and ongoing problem since the patient's stroke 7\10\17 which was large acute hemorrhagic in nature. Patient reports sometimes he is getting problems with lower back pain. He has difficulty qualifying it. His wife reports that sometimes he will endorse chest pain which he did for a little while last night. He is denying any chest pain now. He has not had fever or productive cough. His lower extremity swelling persists and worsens. His wife reports it was somewhat better after a hospitalization at the [place] or they gave him fluid medication but not really resolve. Past Medical History:  Diagnosis Date  . CHF (congestive heart failure) (HCC)   . CVA (cerebral infarction)   . Diabetes mellitus without complication (HCC)   . GERD (gastroesophageal reflux disease)   . Hypertension   . OSA (obstructive sleep apnea)   . Seizure disorder [facility])     Patient Active Problem List   Diagnosis Date Noted  . UTI (urinary tract infection), uncomplicated 05/13/2016  . Adjustment disorder with depressed mood   . Other hypervolemia   . 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 Abscess of right arm 08/18/2013  . Essential hypertension 08/18/2013  . Diabetes mellitus type 2 in nonobese (HCC) 08/18/2013  . Thrombocytopenia, unspecified 08/18/2013    Past Surgical History:  Procedure Laterality Date  . APPLICATION OF WOUND  VAC Right 08/20/2013   Procedure: VAC ASSISTED CLOSURE FOREARM;  Surgeon: Dominica Severin, MD;  Location: [facility] OR;  Service: Orthopedics;  Laterality: Right;  . BRAIN SURGERY  04/07/2016   for hemorragic stroke  . I&D EXTREMITY Right 08/18/2013   Procedure: IRRIGATION AND DEBRIDEMENT EXTREMITY;  Surgeon: Dominica Severin, MD;  Location: [facility] OR;  Service: Orthopedics;  Laterality: Right;  . I&D EXTREMITY Right 08/20/2013   Procedure: IRRIGATION AND DEBRIDEMENT WASHOUT ARM;  Surgeon: Dominica Severin, MD;  Location: [facility] OR;  Service: Orthopedics;  Laterality: Right;  . I&D EXTREMITY Right 08/22/2013   Procedure: IRRIGATION AND DEBRIDEMENT FOREARM COMPLEX WITH WOUND CLOSURE;  Surgeon: Dominica Severin, MD;  Location: [facility] OR;  Service: Orthopedics;  Laterality: Right;       Home Medications    Prior to Admission medications   Medication Sig Start Date End Date Taking? Authorizing Provider  amLODipine (NORVASC) 10 MG tablet Take 0.5 tablets (5 mg total) by mouth daily. 05/16/16  Yes Evlyn Kanner Love, PA-C  aspirin EC 81 MG tablet Take 81 mg by mouth daily.   Yes Historical Provider, MD  cholecalciferol 1000 units tablet Take 1 tablet (1,000 Units total) by mouth daily. 05/16/16  Yes Evlyn Kanner Love, PA-C  cloNIDine (CATAPRES) 0.3 MG tablet Take 1 tablet (0.3 mg total) by mouth 3 (three) times daily. 05/16/16  Yes Evlyn Kanner Love, PA-C  cyclobenzaprine (FLEXERIL) 5 MG tablet Take 1 tablet (5 mg total) by mouth at bedtime. 05/16/16  Yes Rinaldo Cloud  S Love, PA-C  divalproex (DEPAKOTE) 500 MG DR tablet Take 500 mg by mouth 2 (two) times daily.   Yes Historical Provider, MD  folic acid (FOLVITE) 1 MG tablet Take 1 tablet (1 mg total) by mouth daily. 05/16/16  Yes Evlyn Kanner Love, PA-C  furosemide (LASIX) 40 MG tablet Take 1 tablet (40 mg total) by mouth 2 (two) times daily. 05/16/16  Yes Evlyn Kanner Love, PA-C  hydrALAZINE (APRESOLINE) 100 MG tablet Take 1 tablet (100 mg total) by mouth 4 (four) times daily -  with meals and at  bedtime. 05/16/16  Yes Evlyn Kanner Love, PA-C  insulin detemir (LEVEMIR) 100 UNIT/ML injection Inject 0.12 mLs (12 Units total) into the skin 2 (two) times daily. Patient taking differently: Inject 0-12 Units into the skin 2 (two) times daily.  05/16/16  Yes Evlyn Kanner Love, PA-C  levETIRAcetam (KEPPRA) 500 MG tablet Take 1 tablet (500 mg total) by mouth 2 (two) times daily. 05/16/16  Yes Evlyn Kanner Love, PA-C  lisinopril (PRINIVIL,ZESTRIL) 40 MG tablet Take 1 tablet (40 mg total) by mouth daily. 05/16/16  Yes Evlyn Kanner Love, PA-C  metoprolol (LOPRESSOR) 50 MG tablet Take 25 mg by mouth 2 (two) times daily.   Yes Historical Provider, MD  Multiple Vitamins-Minerals (MULTIVITAMIN WITH MINERALS) tablet Take 1 tablet by mouth daily.   Yes Historical Provider, MD  omeprazole (PRILOSEC) 20 MG capsule Take 40 mg by mouth daily.   Yes Historical Provider, MD  polyethylene glycol (MIRALAX / GLYCOLAX) packet Take 17 g by mouth daily. 05/16/16  Yes Evlyn Kanner Love, PA-C  potassium chloride SA (K-DUR,KLOR-CON) 20 MEQ tablet Take 2 tablets (40 mEq total) by mouth 3 (three) times daily. 05/16/16  Yes Evlyn Kanner Love, PA-C  spironolactone (ALDACTONE) 25 MG tablet Take 25 mg by mouth daily.   Yes Historical Provider, MD  tamsulosin (FLOMAX) 0.4 MG CAPS capsule Take 1 capsule (0.4 mg total) by mouth daily after supper. 05/16/16  Yes Evlyn Kanner Love, PA-C  thiamine (VITAMIN B-1) 100 MG tablet Take 100 mg by mouth daily.   Yes Historical Provider, MD    Family History No family history on file.  Social History Social History  Substance Use Topics  . Smoking status: Former Games developer  . Smokeless tobacco: Never Used  . Alcohol use No     Allergies   Shellfish allergy and Cymbalta [duloxetine hcl]   Review of Systems Review of Systems 10 Systems reviewed and are negative for acute change except as noted in the HPI.   Physical Exam Updated Vital Signs BP 135/84   Pulse 84   Temp 98.4 F (36.9 C) (Oral)   Resp 19   Ht 5'  8" (1.727 m)   Wt 228 lb (103.4 kg)   SpO2 99%   BMI 34.67 kg/m   Physical Exam  Constitutional:  Patient is in no acute distress. He is awake and alert. He appears very deconditioned and is obese with evident facial swelling moderate in nature.  HENT:  Well-healed craniotomy scar right parietal. Patient has mild to moderate puffy edema of the face. This is concentrated around his eyes. There is no oral airway impingement his voice is normal. The intraoral mucosa is are moist and without swelling. Patient has several areas of very poor dentition with teeth decayed to the gumline.  Eyes:  Patient has mild to moderate chemosis. Diffuse puffy edema of the upper and lower periorbital area. Patient is able to open his eyes and maintain them  open without significant difficulty. Extraocular motions are intact.  Neck: Neck supple.  Cardiovascular: Normal rate, regular rhythm, normal heart sounds and intact distal pulses.   Pulmonary/Chest: Effort normal and breath sounds normal.  Breath sounds are audible to the bases with no appreciable rails.  Abdominal:  The patient abdomen is distended and moderately firm. Nontender to palpation. Suggestive of ascites. Patient has edema of the dependent portions of his lower abdomen, buttock's and thighs. No significant edema of the scrotum.  Musculoskeletal:  3+ pitting edema of the feet and lower legs. Edema also involves thighs in the dependent area.  Neurological: He is alert.  Patient is alert. He can spontaneously sit forward and hold the hand rails. He is situationally oriented but defers most questions due to recall for his wife.  Skin: Skin is warm and dry.     ED Treatments / Results  Labs (all labs ordered are listed, but only abnormal results are displayed) Labs Reviewed  BASIC METABOLIC PANEL - Abnormal; Notable for the following:       Result Value   Glucose, Bld 132 (*)    BUN 31 (*)    Creatinine, Ser 1.87 (*)    GFR calc non Af Amer  38 (*)    GFR calc Af Amer 44 (*)    All other components within normal limits  CBC WITH DIFFERENTIAL/PLATELET - Abnormal; Notable for the following:    RBC 3.47 (*)    Hemoglobin 10.2 (*)    HCT 30.9 (*)    Platelets 130 (*)    Monocytes Absolute 1.1 (*)    All other components within normal limits  BRAIN NATRIURETIC PEPTIDE - Abnormal; Notable for the following:    B Natriuretic Peptide 271.7 (*)    All other components within normal limits  HEPATIC FUNCTION PANEL - Abnormal; Notable for the following:    Albumin 3.2 (*)    AST 49 (*)    All other components within normal limits  URINALYSIS, ROUTINE W REFLEX MICROSCOPIC (NOT [facility]) - Abnormal; Notable for the following:    Protein, ur 100 (*)    All other components within normal limits  URINE MICROSCOPIC-ADD ON - Abnormal; Notable for the following:    Squamous Epithelial / LPF 0-5 (*)    Bacteria, UA FEW (*)    Casts HYALINE CASTS (*)    All other components within normal limits  TROPONIN I  PROTIME-INR  MAGNESIUM  PHOSPHORUS  CK  VALPROIC ACID LEVEL    EKG  EKG Interpretation  Date/Time:  Monday September 02 2016 09:24:38 EST Ventricular Rate:  79 PR Interval:    QRS Duration: 88 QT Interval:  397 QTC Calculation: 456 R Axis:   63 Text Interpretation:  Sinus rhythm Anteroseptal infarct, old noSTEMI no change from previous Confirmed by Donnald GarrePfeiffer, MD, Lebron ConnersMarcy [id number]) on 09/02/2016 9:34:05 AM       Radiology Ct Abdomen Pelvis Wo Contrast  Result Date: 09/02/2016 CLINICAL DATA:  Chest pain and wheezing.  Coughing EXAM: CT CHEST, ABDOMEN AND PELVIS WITHOUT CONTRAST TECHNIQUE: Multidetector CT imaging of the chest, abdomen and pelvis was performed following the standard protocol without IV contrast. COMPARISON:  Ultrasound 08/19/2013. FINDINGS: CT CHEST FINDINGS Cardiovascular: There is mild cardiac enlargement. No pericardial effusion. Mediastinum/Nodes: No enlarged mediastinal, hilar, or axillary lymph nodes. Thyroid  gland, trachea, and esophagus demonstrate no significant findings. Lungs/Pleura: No pleural fluid. No interstitial edema or airspace consolidation identified. Musculoskeletal: No chest wall mass or suspicious bone lesions identified.  CT ABDOMEN PELVIS FINDINGS Hepatobiliary: The liver has a nodular contour. There is hypertrophy of the lateral segment of left lobe. Re- cannulization of the umbilical vein is identified. Gallstones noted. No biliary dilatation. Pancreas: Unremarkable. No pancreatic ductal dilatation or surrounding inflammatory changes. Spleen: The spleen measures 11.5 cm in length. Adrenals/Urinary Tract: The adrenal glands appear normal. Normal appearance of the kidneys. Urinary bladder appears normal. Stomach/Bowel: The stomach is normal. The small bowel loops have a normal course and caliber. No pathologic dilatation of the colon. Vascular/Lymphatic: Aortic atherosclerosis. No upper abdominal adenopathy. No iliac adenopathy. Left inguinal node is enlarged measuring 1.2 cm, image 120 of series 2. Reproductive: Prostate is unremarkable. Other: There is a moderate amount of abdominal and pelvic ascites identified. Moderate body wall edema (anasarca) noted. Musculoskeletal: There is degenerative disc disease identified within the lumbar spine. This is most advanced at L5-S1. IMPRESSION: 1. Morphologic features of liver compatible with cirrhosis. 2. Re- cannulization of the umbilical vein compatible with portal venous hypertension. 3. Abdominal and pelvic ascites. 4. Aortic atherosclerosis 5. Borderline enlarged left inguinal lymph node, nonspecific. 6. L5-S1 degenerative disc disease. Electronically Signed   By: Signa Kellaylor  Stroud M.D.   On: 09/02/2016 11:51   Ct Chest Wo Contrast  Result Date: 09/02/2016 CLINICAL DATA:  Chest pain and wheezing.  Coughing EXAM: CT CHEST, ABDOMEN AND PELVIS WITHOUT CONTRAST TECHNIQUE: Multidetector CT imaging of the chest, abdomen and pelvis was performed following the  standard protocol without IV contrast. COMPARISON:  Ultrasound 08/19/2013. FINDINGS: CT CHEST FINDINGS Cardiovascular: There is mild cardiac enlargement. No pericardial effusion. Mediastinum/Nodes: No enlarged mediastinal, hilar, or axillary lymph nodes. Thyroid gland, trachea, and esophagus demonstrate no significant findings. Lungs/Pleura: No pleural fluid. No interstitial edema or airspace consolidation identified. Musculoskeletal: No chest wall mass or suspicious bone lesions identified. CT ABDOMEN PELVIS FINDINGS Hepatobiliary: The liver has a nodular contour. There is hypertrophy of the lateral segment of left lobe. Re- cannulization of the umbilical vein is identified. Gallstones noted. No biliary dilatation. Pancreas: Unremarkable. No pancreatic ductal dilatation or surrounding inflammatory changes. Spleen: The spleen measures 11.5 cm in length. Adrenals/Urinary Tract: The adrenal glands appear normal. Normal appearance of the kidneys. Urinary bladder appears normal. Stomach/Bowel: The stomach is normal. The small bowel loops have a normal course and caliber. No pathologic dilatation of the colon. Vascular/Lymphatic: Aortic atherosclerosis. No upper abdominal adenopathy. No iliac adenopathy. Left inguinal node is enlarged measuring 1.2 cm, image 120 of series 2. Reproductive: Prostate is unremarkable. Other: There is a moderate amount of abdominal and pelvic ascites identified. Moderate body wall edema (anasarca) noted. Musculoskeletal: There is degenerative disc disease identified within the lumbar spine. This is most advanced at L5-S1. IMPRESSION: 1. Morphologic features of liver compatible with cirrhosis. 2. Re- cannulization of the umbilical vein compatible with portal venous hypertension. 3. Abdominal and pelvic ascites. 4. Aortic atherosclerosis 5. Borderline enlarged left inguinal lymph node, nonspecific. 6. L5-S1 degenerative disc disease. Electronically Signed   By: Signa Kellaylor  Stroud M.D.   On:  09/02/2016 11:51    Procedures Procedures (including critical care time)  Medications Ordered in ED Medications - No data to display   Initial Impression / Assessment and Plan / ED Course  I have reviewed the triage vital signs and the nursing notes.  Pertinent labs & imaging results that were available during my care of the patient were reviewed by me and considered in my medical decision making (see chart for details).  Clinical Course     Final Clinical  Impressions(s) / ED Diagnoses   Final diagnoses:  Anasarca  Cirrhosis of liver with ascites, unspecified hepatic cirrhosis type [facility])   The patient has anasarca which is very likely due to cirrhosis. His wife was aware that they had been told in the past that he had cirrhosis. She was not familiar with the term anasarca. There is no evidence of SBP. The patient is alert and in no respiratory distress. At this time, plan will be to increase the patient's spironolactone which was initiated 3 days ago with 25 mg daily, to 50 mg twice daily. Patient will continue his Lasix which she is taking 3 times a day. Patient's wife is counseled on the need for close monitoring of the patient's potassium, blood pressure and possible cessation of ACE inhibitor (lisinopril) if potassium should be increasing. Patient's wife seems very aware of her husband's medical care and competent for managing as instructed. New Prescriptions New Prescriptions   No medications on file     Arby Barrette, MD 09/02/16 1244

## 2016-10-05 ENCOUNTER — Encounter (HOSPITAL_BASED_OUTPATIENT_CLINIC_OR_DEPARTMENT_OTHER): Payer: Self-pay | Admitting: Emergency Medicine

## 2016-10-05 ENCOUNTER — Emergency Department (HOSPITAL_BASED_OUTPATIENT_CLINIC_OR_DEPARTMENT_OTHER)
Admission: EM | Admit: 2016-10-05 | Discharge: 2016-10-06 | Disposition: A | Discharge status: Home / Self Care | Payer: Medicare Other | Attending: Emergency Medicine | Admitting: Emergency Medicine

## 2016-10-05 DIAGNOSIS — I509 Heart failure, unspecified: Secondary | ICD-10-CM | POA: Insufficient documentation

## 2016-10-05 DIAGNOSIS — Z87891 Personal history of nicotine dependence: Secondary | ICD-10-CM | POA: Insufficient documentation

## 2016-10-05 DIAGNOSIS — Z794 Long term (current) use of insulin: Secondary | ICD-10-CM | POA: Insufficient documentation

## 2016-10-05 DIAGNOSIS — I110 Hypertensive heart disease with heart failure: Secondary | ICD-10-CM | POA: Insufficient documentation

## 2016-10-05 DIAGNOSIS — E119 Type 2 diabetes mellitus without complications: Secondary | ICD-10-CM | POA: Insufficient documentation

## 2016-10-05 DIAGNOSIS — Z7982 Long term (current) use of aspirin: Secondary | ICD-10-CM | POA: Diagnosis not present

## 2016-10-05 DIAGNOSIS — Z79899 Other long term (current) drug therapy: Secondary | ICD-10-CM | POA: Insufficient documentation

## 2016-10-05 DIAGNOSIS — R040 Epistaxis: Secondary | ICD-10-CM | POA: Insufficient documentation

## 2016-10-05 DIAGNOSIS — J3489 Other specified disorders of nose and nasal sinuses: Secondary | ICD-10-CM | POA: Diagnosis present

## 2016-10-05 HISTORY — DX: Cerebral infarction, unspecified: I63.9

## 2016-10-05 HISTORY — DX: Unspecified cirrhosis of liver: K74.60

## 2016-10-05 MED ORDER — OXYMETAZOLINE HCL 0.05 % NA SOLN
3.0000 | Freq: Once | NASAL | Status: AC
  Administered 2016-10-05: 3 via NASAL
  Filled 2016-10-05: qty 15

## 2016-10-05 NOTE — ED Notes (Signed)
Pt still without any active bleeding at this time.

## 2016-10-05 NOTE — ED Notes (Signed)
Gauze removed from nose, pt without any active bleeding at this time. EDP notified. Pt wife told to call if bleeding resumes, given call light.

## 2016-10-05 NOTE — Discharge Instructions (Signed)
Please read and follow all provided instructions.  Your diagnoses today include:  1. Epistaxis     Tests performed today include:  Vital signs. See below for your results today.   Medications prescribed:   None  Home care instructions:  Read the educational materials provided and follow any instructions contained in this packet.  If your nosebleed happens again: Pinch your nose and hold for 15 minutes without letting go.  If this does not stop the bleeding, pinch and hold for another 15 minutes.  If it continues to bleed after this, please come to the Emergency Department or see your family doctor.   Follow-up instructions: Please follow-up with your primary care provider in the next 3 days for further evaluation of your symptoms.   Return instructions:   Please return to the Emergency Department if you experience worsening symptoms.   Please return if you have any other emergent concerns.  Additional Information:  Your vital signs today were: Ht 5\' 9"  (1.753 m)    Wt 108 kg    BMI 35.15 kg/m  If your blood pressure (BP) was elevated above 135/85 this visit, please have this repeated by your doctor within one month. --------------

## 2016-10-05 NOTE — ED Triage Notes (Signed)
Pt reports nosebleed started this evening at approx 2015 and it has not stopped since.  Pressure and gauze applied.

## 2016-10-05 NOTE — ED Provider Notes (Signed)
[facility] Provider Note   CSN: [id number] Arrival date & time: 10/05/16  2050   By signing my name below, I, Soijett Blue, attest that this documentation has been prepared under the direction and in the presence of Rhea Bleacher, PA-C Electronically Signed: Soijett Blue, ED Scribe. 10/05/16. 9:38 PM.  History   Chief Complaint Chief Complaint  Patient presents with  . Epistaxis    HPI Timo Hartwig is a 61 y.o. male with a PMHx of DM, HTN, who presents to the Emergency Department complaining of nosebleed onset 1 hour ago. Pt notes that he had rhinorrhea prior to the onset of his symptoms. He has tried applying pressure without medications with no relief for his symptoms. He denies fever, chills, and any other symptoms. Denies being on anticoagulants at this time. Denies hx of nosebleeds.    The history is provided by the patient. No language interpreter was used.    Past Medical History:  Diagnosis Date  . CHF (congestive heart failure) (HCC)   . Cirrhosis of liver (HCC)   . CVA (cerebral infarction)   . Diabetes mellitus without complication (HCC)   . GERD (gastroesophageal reflux disease)   . Hypertension   . OSA (obstructive sleep apnea)   . Seizure disorder (HCC)   . Stroke [facility])     Patient Active Problem List   Diagnosis Date Noted  . UTI (urinary tract infection), uncomplicated 05/13/2016  . Adjustment disorder with depressed mood   . Other hypervolemia   . 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 Abscess of right arm 08/18/2013  . Essential hypertension 08/18/2013  . Diabetes mellitus type 2 in nonobese (HCC) 08/18/2013  . Thrombocytopenia, unspecified 08/18/2013    Past Surgical History:  Procedure Laterality Date  . APPLICATION OF WOUND VAC Right 08/20/2013   Procedure: VAC ASSISTED CLOSURE FOREARM;  Surgeon: Dominica Severin, MD;  Location: [facility] OR;  Service: Orthopedics;   Laterality: Right;  . BRAIN SURGERY  04/07/2016   for hemorragic stroke  . I&D EXTREMITY Right 08/18/2013   Procedure: IRRIGATION AND DEBRIDEMENT EXTREMITY;  Surgeon: Dominica Severin, MD;  Location: [facility] OR;  Service: Orthopedics;  Laterality: Right;  . I&D EXTREMITY Right 08/20/2013   Procedure: IRRIGATION AND DEBRIDEMENT WASHOUT ARM;  Surgeon: Dominica Severin, MD;  Location: [facility] OR;  Service: Orthopedics;  Laterality: Right;  . I&D EXTREMITY Right 08/22/2013   Procedure: IRRIGATION AND DEBRIDEMENT FOREARM COMPLEX WITH WOUND CLOSURE;  Surgeon: Dominica Severin, MD;  Location: [facility] OR;  Service: Orthopedics;  Laterality: Right;       Home Medications    Prior to Admission medications   Medication Sig Start Date End Date Taking? Authorizing Provider  amLODipine (NORVASC) 10 MG tablet Take 0.5 tablets (5 mg total) by mouth daily. 05/16/16   Jacquelynn Cree, PA-C  aspirin EC 81 MG tablet Take 81 mg by mouth daily.    Historical Provider, MD  cholecalciferol 1000 units tablet Take 1 tablet (1,000 Units total) by mouth daily. 05/16/16   Jacquelynn Cree, PA-C  cloNIDine (CATAPRES) 0.3 MG tablet Take 1 tablet (0.3 mg total) by mouth 3 (three) times daily. 05/16/16   Jacquelynn Cree, PA-C  cyclobenzaprine (FLEXERIL) 5 MG tablet Take 1 tablet (5 mg total) by mouth at bedtime. 05/16/16   Jacquelynn Cree, PA-C  divalproex (DEPAKOTE) 500 MG DR tablet Take 500 mg by mouth 2 (two) times daily.    Historical Provider, MD  folic acid (FOLVITE) 1 MG tablet Take 1 tablet (1 mg total) by mouth daily. 05/16/16   Jacquelynn Cree, PA-C  furosemide (LASIX) 40 MG tablet Take 1 tablet (40 mg total) by mouth 2 (two) times daily. 05/16/16   Jacquelynn Cree, PA-C  hydrALAZINE (APRESOLINE) 100 MG tablet Take 1 tablet (100 mg total) by mouth 4 (four) times daily -  with meals and at bedtime. 05/16/16   Evlyn Kanner Love, PA-C  insulin detemir (LEVEMIR) 100 UNIT/ML injection Inject 0.12 mLs (12 Units total) into the skin 2 (two) times daily. Patient  taking differently: Inject 0-12 Units into the skin 2 (two) times daily.  05/16/16   Jacquelynn Cree, PA-C  levETIRAcetam (KEPPRA) 500 MG tablet Take 1 tablet (500 mg total) by mouth 2 (two) times daily. 05/16/16   Evlyn Kanner Love, PA-C  lisinopril (PRINIVIL,ZESTRIL) 40 MG tablet Take 1 tablet (40 mg total) by mouth daily. 05/16/16   Jacquelynn Cree, PA-C  metoprolol (LOPRESSOR) 50 MG tablet Take 25 mg by mouth 2 (two) times daily.    Historical Provider, MD  Multiple Vitamins-Minerals (MULTIVITAMIN WITH MINERALS) tablet Take 1 tablet by mouth daily.    Historical Provider, MD  omeprazole (PRILOSEC) 20 MG capsule Take 40 mg by mouth daily.    Historical Provider, MD  polyethylene glycol (MIRALAX / GLYCOLAX) packet Take 17 g by mouth daily. 05/16/16   Evlyn Kanner Love, PA-C  potassium chloride SA (K-DUR,KLOR-CON) 20 MEQ tablet Take 2 tablets (40 mEq total) by mouth 3 (three) times daily. 05/16/16   Jacquelynn Cree, PA-C  spironolactone (ALDACTONE) 25 MG tablet Take 25 mg by mouth daily.    Historical Provider, MD  tamsulosin (FLOMAX) 0.4 MG CAPS capsule Take 1 capsule (0.4 mg total) by mouth daily after supper. 05/16/16   Jacquelynn Cree, PA-C  thiamine (VITAMIN B-1) 100 MG tablet Take 100 mg by mouth daily.    Historical Provider, MD    Family History No family history on file.  Social History Social History  Substance Use Topics  . Smoking status: Former Games developer  . Smokeless tobacco: Never Used  . Alcohol use No     Allergies   Shellfish allergy and Cymbalta [duloxetine hcl]   Review of Systems Review of Systems  Constitutional: Negative for chills and fever.  HENT: Positive for nosebleeds and rhinorrhea.   Respiratory: Negative for shortness of breath.   Cardiovascular: Negative for chest pain.  Gastrointestinal: Negative for blood in stool.  Neurological: Negative for syncope and light-headedness.  Hematological: Does not bruise/bleed easily.     Physical Exam Updated Vital Signs BP 125/77  (BP Location: Left Arm)   Pulse 74   Temp 97.4 F (36.3 C)   Resp 22   Ht 5\' 9"  (1.753 m)   Wt 108 kg   SpO2 99%   BMI 35.15 kg/m   Physical Exam  Constitutional: He appears well-developed and well-nourished. No distress.  HENT:  Head: Normocephalic and atraumatic.  Nose: Mucosal edema present. Epistaxis (Nose bleed noted from right anterior septum) is observed.  Mouth/Throat: Oropharynx is clear and moist.  Eyes: EOM are normal.  Neck: Neck supple.  Cardiovascular: Normal rate and regular rhythm.   No murmur heard. Pulmonary/Chest: Effort normal. No respiratory distress.  Abdominal: He exhibits no distension.  Musculoskeletal: Normal range of motion.  Neurological: He is alert.  Skin: Skin is warm and dry.  Psychiatric: He has a normal mood and affect. His behavior is normal.  Nursing note and vitals reviewed.    ED Treatments / Results  DIAGNOSTIC STUDIES: Oxygen Saturation is 99% on RA, nl by my interpretation.    COORDINATION OF CARE: 9:38 PM Discussed treatment plan with pt at bedside which includes applying pressure, Afrin and pt agreed to plan.   Procedures Procedures (including critical care time)  Medications Ordered in ED Medications - No data to display   Initial Impression / Assessment and Plan / ED Course  I have reviewed the triage vital signs and the nursing notes.  Clinical Course    Patient held pressure on the nose with assistance for approximately 15 minutes. Patient continued to have some bleeding. Afrin was administered and patient once again held pressure. This improved the symptoms and bleeding stopped. Patient was monitored in the emergency department without resuming bleeding.   Encouraged patient's wife to use Vaseline or bacitracin inside the nose twice a day to help moisturize. Instructed patient and wife on how to attempt to stop nosebleeds if this recurs at home. Encouraged them to return if bleeding persists more than 30 minutes,  become severe, patient has shortness of breath or lightheadedness. They verbalized understanding and agreed plan.   Final Clinical Impressions(s) / ED Diagnoses   Final diagnoses:  Epistaxis   Patient with nosebleed, no anticoagulation, resolved with measures in emergency department. No signs of significant blood loss or anemia.   New Prescriptions Discharge Medication List as of 10/05/2016 11:42 PM     I personally performed the services described in this documentation, which was scribed in my presence. The recorded information has been reviewed and is accurate.     Renne CriglerJoshua Novalie Leamy, PA-C 10/06/16 1945    Gwyneth SproutWhitney Plunkett, MD 10/07/16 0010

## 2016-10-05 NOTE — ED Notes (Signed)
R nare still trickling blood prior to and following Afrin administration.

## 2016-12-12 IMAGING — CT CT ANGIO NECK
1 of 9 series · 6 of 33 positions shown · IV contrast (APPLIED)
Comparison: Multiple previous CT examinations earlier this year.
MRI 09/02/2013.

CLINICAL DATA: Development of left-sided visual loss. History of
previous strokes.

EXAM:
CT ANGIOGRAPHY HEAD AND NECK
TECHNIQUE: Multidetector CT imaging of the head and neck was performed using
the standard protocol during bolus administration of intravenous
contrast. Multiplanar CT image reconstructions and MIPs were
obtained to evaluate the vascular anatomy. Carotid stenosis
measurements (when applicable) are obtained utilizing NASCET
criteria, using the distal internal carotid diameter as the
denominator.
CONTRAST:  100 cc Isovue 370

[Series 8: axial thin · axial · 0.48mm/px · z∈[-343,-104]mm · 6 of 335 slices shown]
[im 48/335  soft-tissue]
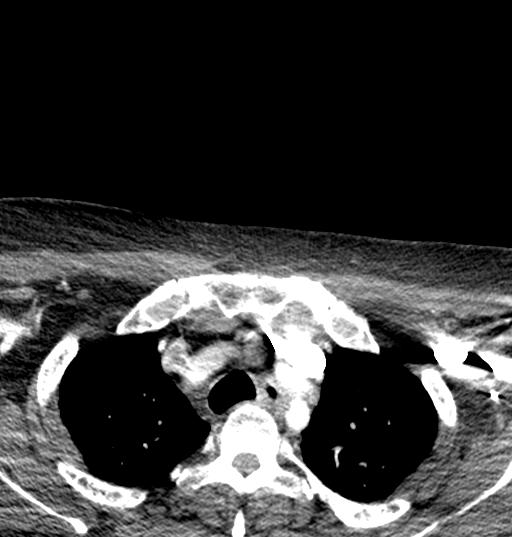
[im 96/335  bone]
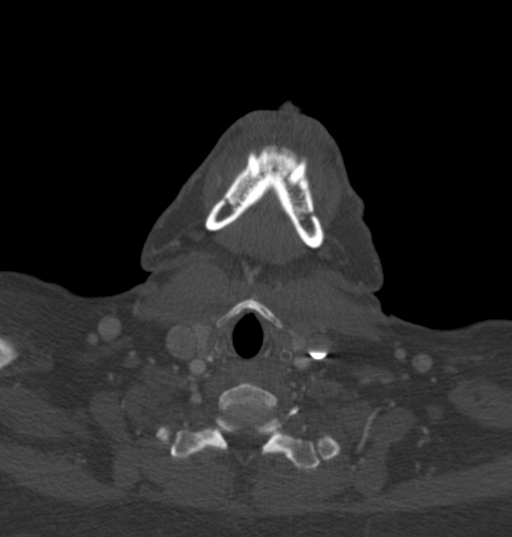
[im 144/335  soft-tissue]
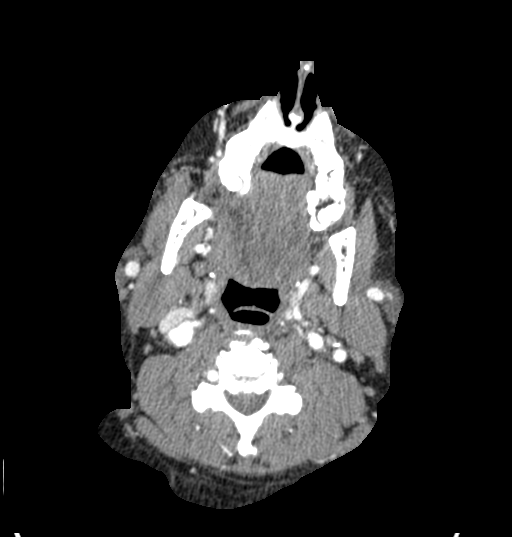
[im 191/335  bone]
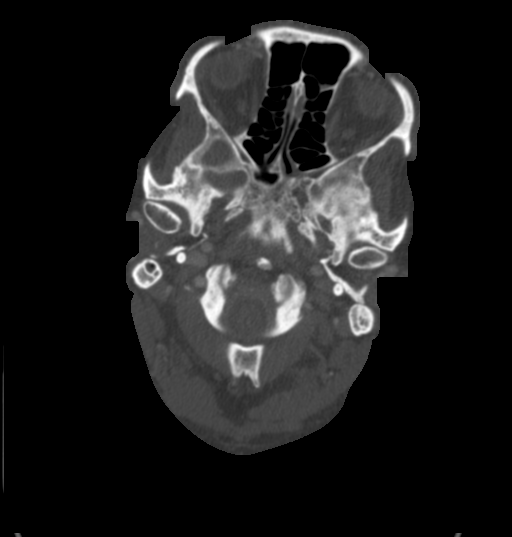
[im 239/335  soft-tissue]
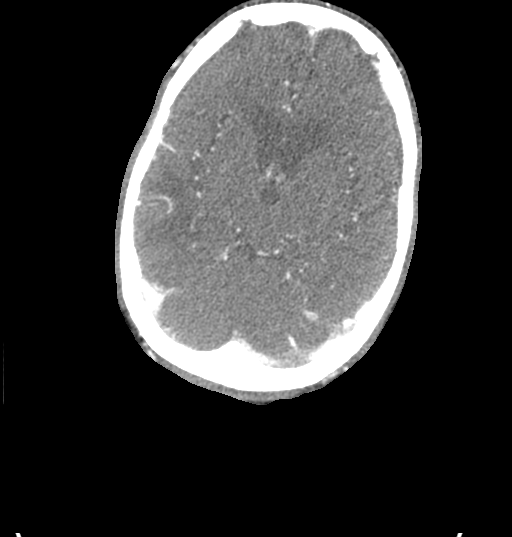
[im 287/335  bone]
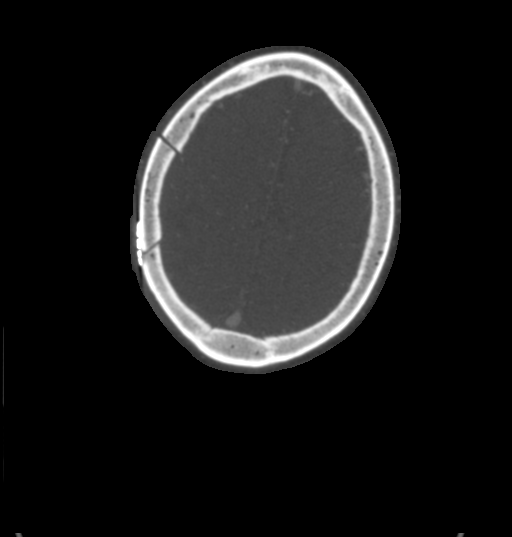

[6 of 33 positions shown; findings below may reference images not displayed]

FINDINGS: CTA NECK

Aortic arch: Normal

Right carotid system: Common carotid artery widely patent to the
bifurcation. The carotid bifurcation is normal without
atherosclerotic plaque, narrowing or irregularity. Cervical internal
carotid artery is widely patent.

Left carotid system: Left common carotid artery widely patent to the
bifurcation. The carotid bifurcation is normal without
atherosclerotic plaque, narrowing or irregularity. Cervical internal
carotid artery widely patent.

Vertebral arteries:Right vertebral artery dominant. Right vertebral
artery origin widely patent. Atherosclerotic calcification of the
proximal right vertebral artery less than 1 cm from the origin with
stenosis estimated at 70%. Right vertebral artery beyond Irorita is
widely patent through the neck. Left vertebral artery origin is
widely patent. This small vessel appears widely patent through the
neck.

Skeleton: Advanced cervical spondylosis with kyphotic curvature

Other neck: No soft tissue lesion identified.

Upper chest: Upper lungs are clear.

CTA HEAD

Anterior circulation: Both internal carotid arteries patent through
the skullbase and siphon regions. Some atherosclerotic calcification
of the carotid siphons but no flow-limiting stenosis. The anterior
and middle cerebral vessels are patent without proximal stenosis,
aneurysm or vascular malformation.

Posterior circulation: Both vertebral arteries patent at the foramen
magnum level. The right vertebral artery terminates in PICA. There
is atherosclerotic disease of the left vertebral artery with
stenosis estimated at 50%. This vessel those on an supplies the
basilar which shows normal caliber. Superior cerebellar and
posterior cerebral vessels are patent bilaterally.

Venous sinuses: Patent and normal.

Anatomic variants: None significant

Delayed phase: Mild enhancement in the region of the evolving
encephalomalacia in the right parietal region.
IMPRESSION: No carotid disease in the neck.

70% stenosis of the dominant right vertebral artery less than 1 cm
from its origin. Second 50% stenosis of the right vertebral artery
just past the foramen magnum.

No intracranial large or medium vessel occlusion or correctable
proximal stenosis.

## 2016-12-12 IMAGING — CT CT HEAD W/O CM
3 series · 14 of 47 positions shown, 16 images · non-contrast
Comparison: 05/14/2016 head CT.

CLINICAL DATA: Vision loss. Headache. Recent right temporoparietal
hemorrhage requiring evacuation.

EXAM:
CT HEAD WITHOUT CONTRAST
TECHNIQUE: Contiguous axial images were obtained from the base of the skull
through the vertex without intravenous contrast.

[Series 2: head wo · axial · 0.47mm/px · z∈[-120,+5]mm · 8 of 30 slices shown, 10 images]
[im 3/30  brain]
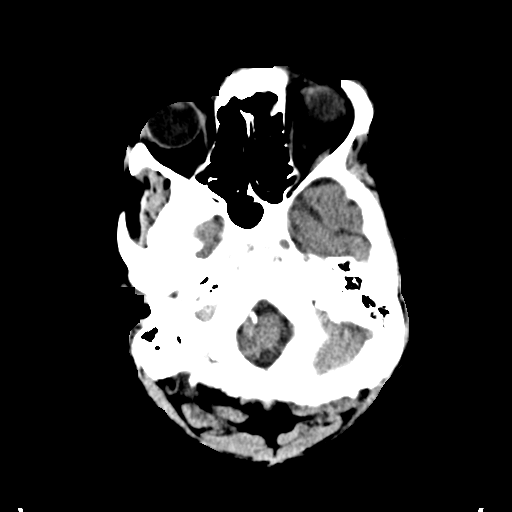
[im 3/30  bone]
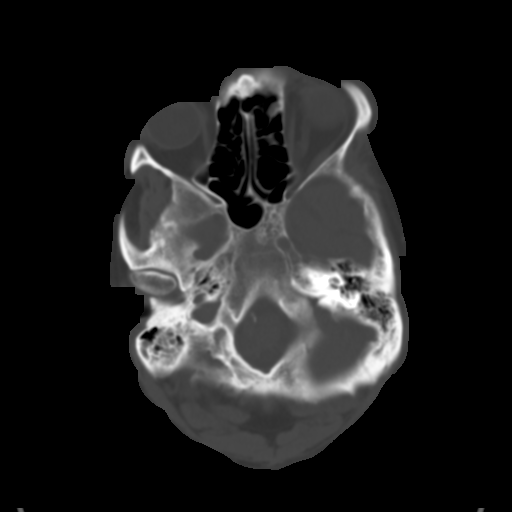
[im 7/30  brain]
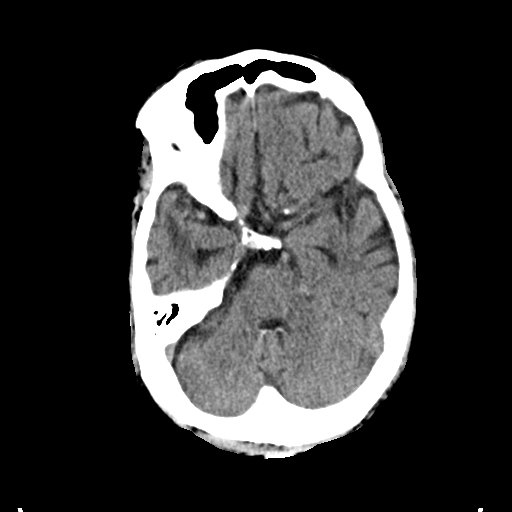
[im 10/30  brain]
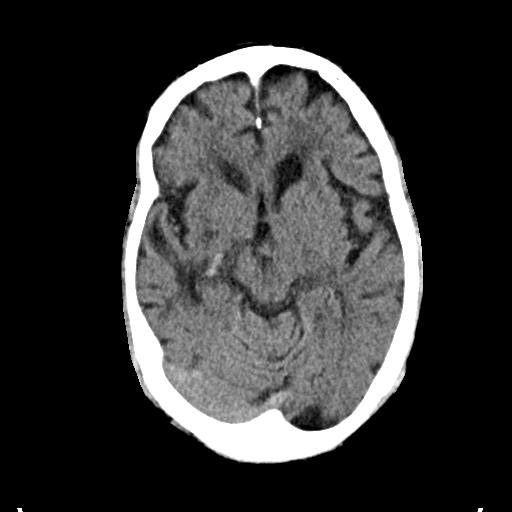
[im 14/30  brain]
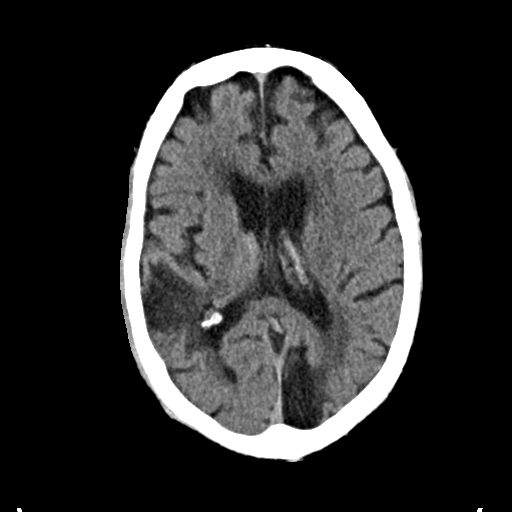
[im 17/30  brain]
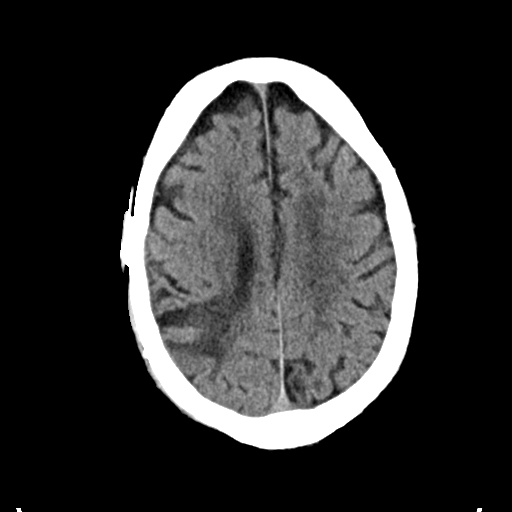
[im 17/30  bone]
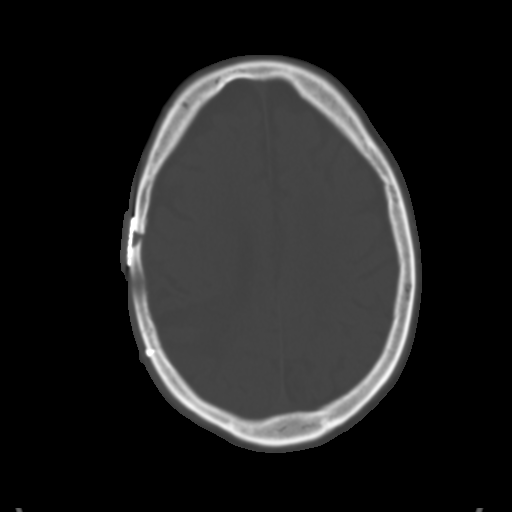
[im 21/30  brain]
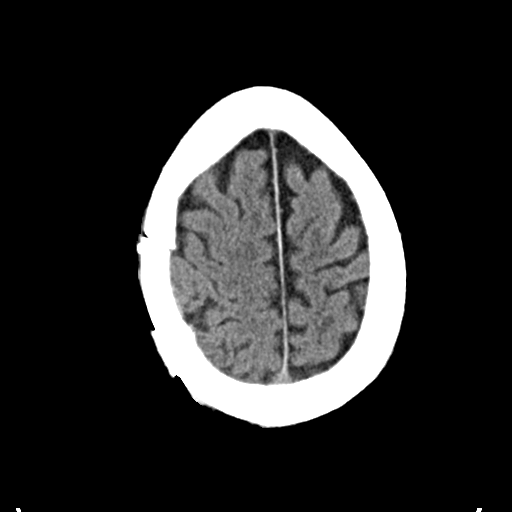
[im 24/30  brain]
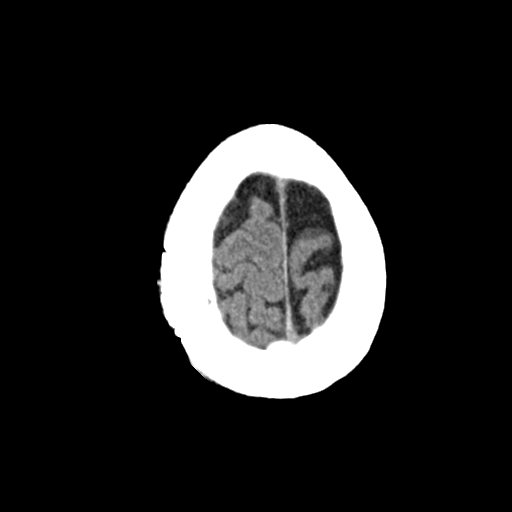
[im 28/30  brain]
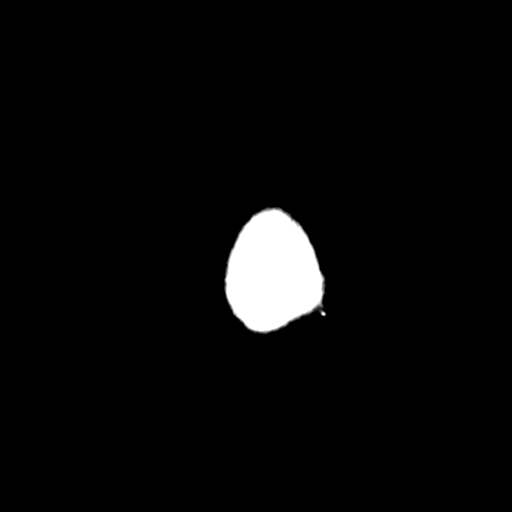

[Series 4: coronal soft · coronal · 0.29mm/px · 3 of 67 slices shown]
[im 23/67  brain]
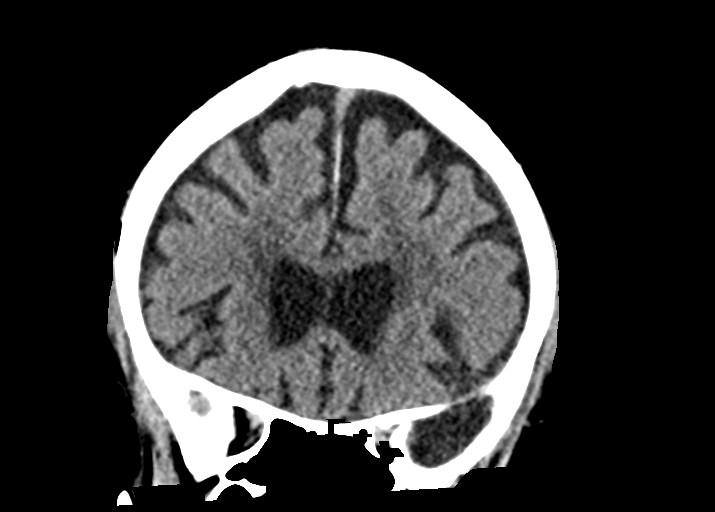
[im 30/67  brain]
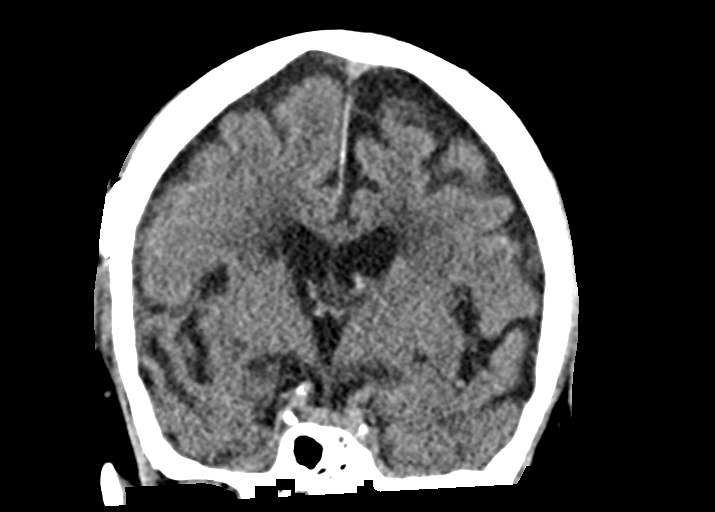
[im 37/67  brain]
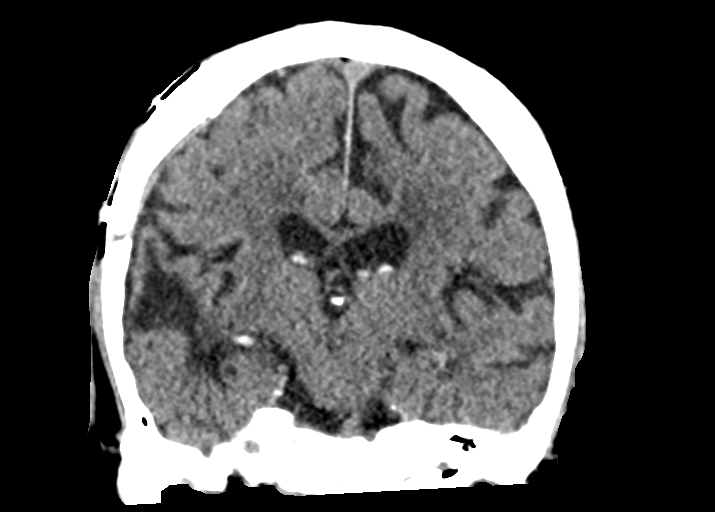

[Series 5: sag soft · sagittal · 0.29mm/px · 3 of 67 slices shown]
[im 23/67  brain]
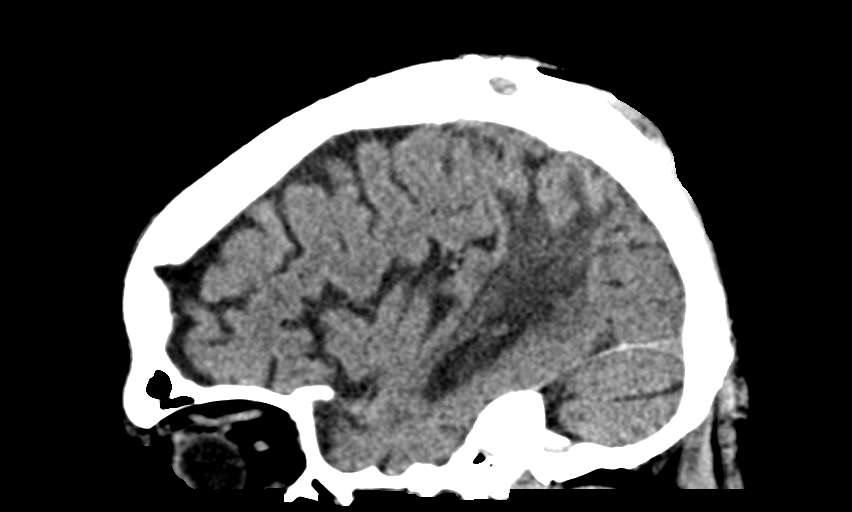
[im 34/67  brain]
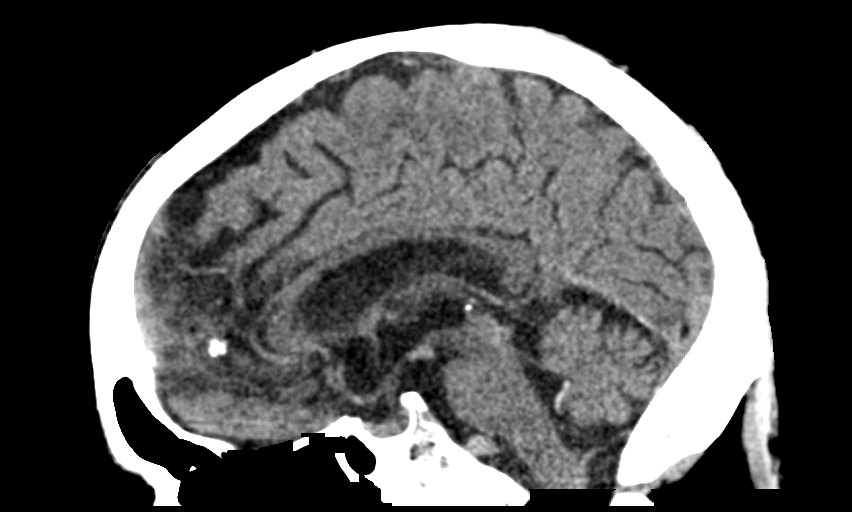
[im 45/67  brain]
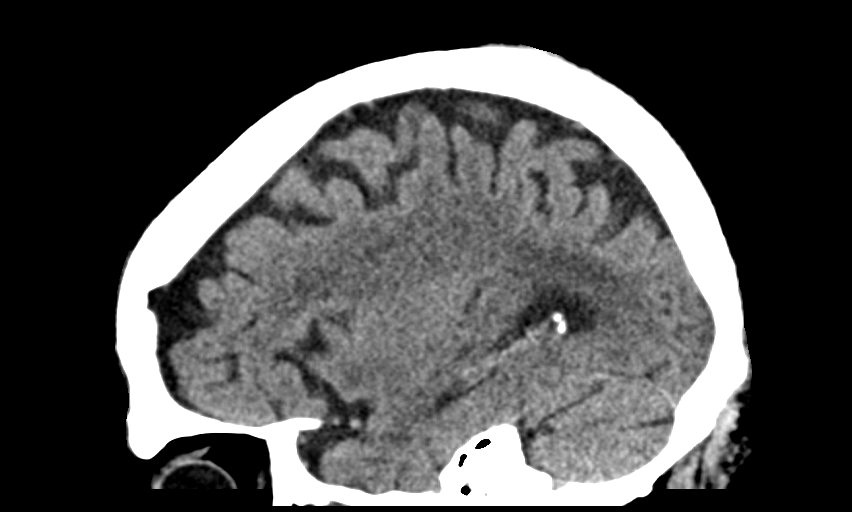

[14 of 47 positions shown; findings below may reference images not displayed]

FINDINGS: Brain: There is evolving encephalomalacia in the right
temporoparietal lobe with decreased mass-effect on the local sulci
in this location. There is chronic encephalomalacia in the left
occipital lobe. No evidence of parenchymal hemorrhage or extra-axial
fluid collection. No mass lesion, mass effect, or midline shift. No
CT evidence of acute infarction. Generalized cerebral volume loss.
Intracranial atherosclerosis. Nonspecific stable mild-to-moderate
subcortical and periventricular white matter hypodensity, most in
keeping with chronic small vessel ischemic change. Ventricles appear
stable with ex vacuo dilatation of the left greater than right
occipital horns.

Vascular: No hyperdense vessel or unexpected calcification.

Skull: No evidence of calvarial fracture. Status post right
craniotomy.

Sinuses/Orbits: The visualized paranasal sinuses are essentially
clear.

Other: Stable partial opacification of the right mastoid air cells.
Left mastoid air cells are non-opacified.
IMPRESSION: 1.  No evidence of acute intracranial abnormality.
2. Evolving right temporoparietal encephalomalacia. Chronic left
occipital lobe encephalomalacia. Generalized cerebral volume loss
and chronic small vessel ischemia.
3. Stable right mastoid effusion.

## 2017-02-28 DEATH — deceased
